# Patient Record
Sex: Female | Born: 1935
Health system: Southern US, Community
[De-identification: ages and names within clinical notes are randomized; demographics above are authoritative.]

## PROBLEM LIST (undated history)

## (undated) DIAGNOSIS — I499 Cardiac arrhythmia, unspecified: Secondary | ICD-10-CM

## (undated) DIAGNOSIS — K449 Diaphragmatic hernia without obstruction or gangrene: Secondary | ICD-10-CM

## (undated) DIAGNOSIS — IMO0002 Reserved for concepts with insufficient information to code with codable children: Secondary | ICD-10-CM

## (undated) DIAGNOSIS — I10 Essential (primary) hypertension: Secondary | ICD-10-CM

## (undated) DIAGNOSIS — M8580 Other specified disorders of bone density and structure, unspecified site: Secondary | ICD-10-CM

## (undated) DIAGNOSIS — K5790 Diverticulosis of intestine, part unspecified, without perforation or abscess without bleeding: Secondary | ICD-10-CM

## (undated) DIAGNOSIS — E785 Hyperlipidemia, unspecified: Secondary | ICD-10-CM

## (undated) DIAGNOSIS — C801 Malignant (primary) neoplasm, unspecified: Secondary | ICD-10-CM

## (undated) HISTORY — DX: Essential (primary) hypertension: I10

## (undated) HISTORY — PX: APPENDECTOMY: SHX54

## (undated) HISTORY — DX: Reserved for concepts with insufficient information to code with codable children: IMO0002

## (undated) HISTORY — DX: Other specified disorders of bone density and structure, unspecified site: M85.80

## (undated) HISTORY — DX: Cardiac arrhythmia, unspecified: I49.9

## (undated) HISTORY — DX: Malignant (primary) neoplasm, unspecified: C80.1

## (undated) HISTORY — DX: Diaphragmatic hernia without obstruction or gangrene: K44.9

## (undated) HISTORY — DX: Hyperlipidemia, unspecified: E78.5

## (undated) HISTORY — PX: BREAST SURGERY: SHX581

## (undated) HISTORY — PX: CATARACT EXTRACTION: SUR2

## (undated) HISTORY — DX: Diverticulosis of intestine, part unspecified, without perforation or abscess without bleeding: K57.90

---

## 1999-03-13 ENCOUNTER — Other Ambulatory Visit: Admission: RE | Admit: 1999-03-13 | Discharge: 1999-03-13 | Payer: Self-pay | Admitting: Obstetrics and Gynecology

## 1999-03-20 ENCOUNTER — Ambulatory Visit (HOSPITAL_COMMUNITY): Admission: RE | Admit: 1999-03-20 | Discharge: 1999-03-20 | Payer: Self-pay | Admitting: Obstetrics and Gynecology

## 1999-03-20 ENCOUNTER — Encounter: Payer: Self-pay | Admitting: Obstetrics and Gynecology

## 2000-05-11 ENCOUNTER — Encounter: Payer: Self-pay | Admitting: Cardiology

## 2000-05-11 ENCOUNTER — Ambulatory Visit (HOSPITAL_COMMUNITY): Admission: RE | Admit: 2000-05-11 | Discharge: 2000-05-11 | Payer: Self-pay | Admitting: Cardiology

## 2001-05-31 ENCOUNTER — Ambulatory Visit (HOSPITAL_COMMUNITY): Admission: RE | Admit: 2001-05-31 | Discharge: 2001-05-31 | Payer: Self-pay | Admitting: Internal Medicine

## 2001-05-31 ENCOUNTER — Encounter: Payer: Self-pay | Admitting: Internal Medicine

## 2002-05-23 ENCOUNTER — Other Ambulatory Visit: Admission: RE | Admit: 2002-05-23 | Discharge: 2002-05-23 | Payer: Self-pay | Admitting: Dermatology

## 2002-06-14 ENCOUNTER — Ambulatory Visit (HOSPITAL_COMMUNITY): Admission: RE | Admit: 2002-06-14 | Discharge: 2002-06-14 | Payer: Self-pay | Admitting: Internal Medicine

## 2002-06-14 ENCOUNTER — Encounter: Payer: Self-pay | Admitting: Internal Medicine

## 2003-06-20 ENCOUNTER — Encounter: Payer: Self-pay | Admitting: Internal Medicine

## 2003-06-20 ENCOUNTER — Ambulatory Visit (HOSPITAL_COMMUNITY): Admission: RE | Admit: 2003-06-20 | Discharge: 2003-06-20 | Payer: Self-pay | Admitting: Internal Medicine

## 2003-08-31 ENCOUNTER — Ambulatory Visit (HOSPITAL_COMMUNITY): Admission: RE | Admit: 2003-08-31 | Discharge: 2003-08-31 | Payer: Self-pay | Admitting: Internal Medicine

## 2004-06-25 ENCOUNTER — Ambulatory Visit (HOSPITAL_COMMUNITY): Admission: RE | Admit: 2004-06-25 | Discharge: 2004-06-25 | Payer: Self-pay | Admitting: Internal Medicine

## 2005-07-09 ENCOUNTER — Ambulatory Visit (HOSPITAL_COMMUNITY): Admission: RE | Admit: 2005-07-09 | Discharge: 2005-07-09 | Payer: Self-pay | Admitting: Internal Medicine

## 2006-07-15 ENCOUNTER — Ambulatory Visit (HOSPITAL_COMMUNITY): Admission: RE | Admit: 2006-07-15 | Discharge: 2006-07-15 | Payer: Self-pay | Admitting: Internal Medicine

## 2006-07-21 ENCOUNTER — Encounter: Admission: RE | Admit: 2006-07-21 | Discharge: 2006-07-21 | Payer: Self-pay | Admitting: Internal Medicine

## 2007-07-28 ENCOUNTER — Encounter: Admission: RE | Admit: 2007-07-28 | Discharge: 2007-07-28 | Payer: Self-pay | Admitting: Internal Medicine

## 2008-08-10 ENCOUNTER — Encounter: Admission: RE | Admit: 2008-08-10 | Discharge: 2008-08-10 | Payer: Self-pay | Admitting: Internal Medicine

## 2008-08-25 ENCOUNTER — Ambulatory Visit: Payer: Self-pay | Admitting: Cardiology

## 2008-08-31 ENCOUNTER — Encounter: Payer: Self-pay | Admitting: Cardiology

## 2008-08-31 ENCOUNTER — Ambulatory Visit (HOSPITAL_COMMUNITY): Admission: RE | Admit: 2008-08-31 | Discharge: 2008-08-31 | Payer: Self-pay | Admitting: Cardiology

## 2008-08-31 ENCOUNTER — Ambulatory Visit: Payer: Self-pay | Admitting: Cardiology

## 2009-08-13 ENCOUNTER — Encounter: Admission: RE | Admit: 2009-08-13 | Discharge: 2009-08-13 | Payer: Self-pay | Admitting: Internal Medicine

## 2010-09-04 ENCOUNTER — Encounter: Admission: RE | Admit: 2010-09-04 | Discharge: 2010-09-04 | Payer: Self-pay | Admitting: Internal Medicine

## 2010-09-10 ENCOUNTER — Encounter: Payer: Self-pay | Admitting: Cardiology

## 2010-09-12 ENCOUNTER — Ambulatory Visit: Payer: Self-pay | Admitting: Cardiology

## 2010-09-12 DIAGNOSIS — I10 Essential (primary) hypertension: Secondary | ICD-10-CM | POA: Insufficient documentation

## 2010-09-12 DIAGNOSIS — R9431 Abnormal electrocardiogram [ECG] [EKG]: Secondary | ICD-10-CM

## 2010-09-12 DIAGNOSIS — R0602 Shortness of breath: Secondary | ICD-10-CM | POA: Insufficient documentation

## 2010-09-23 ENCOUNTER — Telehealth (INDEPENDENT_AMBULATORY_CARE_PROVIDER_SITE_OTHER): Payer: Self-pay | Admitting: *Deleted

## 2010-09-24 ENCOUNTER — Encounter (HOSPITAL_COMMUNITY)
Admission: RE | Admit: 2010-09-24 | Discharge: 2010-11-26 | Payer: Self-pay | Source: Home / Self Care | Attending: Cardiology | Admitting: Cardiology

## 2010-09-24 ENCOUNTER — Encounter: Payer: Self-pay | Admitting: *Deleted

## 2010-09-24 ENCOUNTER — Ambulatory Visit: Payer: Self-pay

## 2010-09-24 ENCOUNTER — Encounter: Payer: Self-pay | Admitting: Internal Medicine

## 2010-09-24 ENCOUNTER — Ambulatory Visit: Payer: Self-pay | Admitting: Cardiology

## 2010-11-26 NOTE — Assessment & Plan Note (Signed)
Summary: ec6/LBBB last seen in 09 by rothbart   Visit Type:  Initial Consult Primary Provider:  Dr. Felipa Eth  CC:  Abnormal EKG.  History of Present Illness: The patient is lovely 77 year old who has had an abnormal EKG in the past with left anterior fascicular block and conduction delays. In 2009 she did have an echocardiogram which demonstrated well-preserved ejection fraction and no significant abnormalities. She now has an EKG with left bundle branch block.  She does have some dyspnea on exertion. She will become short of breath walking a moderate distance on level ground. She gets short of breath walking a flight of stairs. This has been slowly left. She denies any chest pressure, left arm discomfort. She has not had any palpitations, presyncope or syncope. She does not report PND or orthopnea. She has had some mild lower extremity swelling. She does do some activity at the [facility] and participates in "[employer]ne[employer]".  Current Medications (verified): 1)  Simvastatin 20 Mg Tabs (Simvastatin) .Marland Kitchen.. 1 By Mouth Daily 2)  Calcium 600 1500 Mg Tabs (Calcium Carbonate) .... 2  By Mouth Daily 3)  Fish Oil   Oil (Fish Oil) .Marland Kitchen.. 1 By Mouth Daily 4)  Aspirin 81 Mg  Tabs (Aspirin) .Marland Kitchen.. 1 By Mouth Daily  Allergies (verified): No Known Drug Allergies  Past History:  Past Medical History: Hiatal hernia Hyperlipidemia Hypertension Degenerative disc disease Diverticular disease with colonic polyps Osteopenia  Past Surgical History: Appendectomy  Review of Systems       As stated in the HPI and negative for all other systems.   Vital Signs:  Patient profile:   77 year old female Height:      62 inches Weight:      148 pounds BMI:     27.17 Pulse rate:   70 / minute Resp:     16 per minute BP sitting:   142 / 82  (right arm)  Vitals Entered By: Marrion Coy, CNA (September 12, 2010 12:02 PM)  Physical Exam  General:  Well developed, well nourished, in no acute distress. Head:   normocephalic and atraumatic Eyes:  PERRLA/EOM intact; conjunctiva and lids normal. Neck:  Neck supple, no JVD. No masses, thyromegaly or abnormal cervical nodes. Chest Wall:  no deformities or breast masses noted Lungs:  Clear bilaterally to auscultation and percussion. Abdomen:  Bowel sounds positive; abdomen soft and non-tender without masses, organomegaly, or hernias noted. No hepatosplenomegaly. Msk:  Back normal, normal gait. Muscle strength and tone normal. Extremities:  No clubbing or cyanosis. Neurologic:  Alert and oriented x 3. Skin:  Intact without lesions or rashes. Cervical Nodes:  no significant adenopathy Axillary Nodes:  no significant adenopathy Inguinal Nodes:  no significant adenopathy Psych:  Normal affect.   Detailed Cardiovascular Exam  Neck    Carotids: Carotids full and equal bilaterally without bruits.      Neck Veins: Normal, no JVD.    Heart    Inspection: no deformities or lifts noted.      Palpation: normal PMI with no thrills palpable.      Auscultation: regular rate and rhythm, S1, S2 without murmurs, rubs, gallops, or clicks.    Vascular    Abdominal Aorta: no palpable masses, pulsations, or audible bruits.      Femoral Pulses: normal femoral pulses bilaterally.      Pedal Pulses: normal pedal pulses bilaterally.      Radial Pulses: normal radial pulses bilaterally.      Peripheral Circulation: no clubbing, cyanosis,  or edema noted with normal capillary refill.     EKG  Procedure date:  09/12/2010  Findings:      Sinus rhythm, rate 61, left bundle branch block  Impression & Recommendations:  Problem # 1:  ELECTROCARDIOGRAM, ABNORMAL (ICD-794.31) I suspect this is a progression of her previous conduction defect. I will be evaluating this in the context of investigating her dyspnea as described below. Orders: Nuclear Stress Test (Nuc Stress Test) EKG w/ Interpretation (93000)  Problem # 2:  DYSPNEA (ICD-786.05) The patient has had  progressive dyspnea with activity. She has cardiovascular risk factors including a family history with one of her brothers having heart disease starting in his 66s. She needs stress testing but would need perfusion imaging because of her bundle branch block. I will arrange this. Orders: Nuclear Stress Test (Nuc Stress Test) EKG w/ Interpretation (93000)  Problem # 3:  ESSENTIAL HYPERTENSION, BENIGN (ICD-401.1) The patient's blood pressure is upper limits of acceptable today. I will not make any changes to her regimen today.  Patient Instructions: 1)  Your physician recommends that you schedule a follow-up appointment after testing as needed 2)  Your physician recommends that you continue on your current medications as directed. Please refer to the Current Medication list given to you today. 3)  Your physician has requested that you have an adenosine myoview.  For further information please visit [email or web address]  Please follow instruction sheet, as given.

## 2010-11-26 NOTE — Assessment & Plan Note (Signed)
Summary: Cardiology Nuclear Testing  Nuclear Med Background Indications for Stress Test: Evaluation for Ischemia, Abnormal EKG   History: Echo  History Comments: '09 Echo:NL.  Symptoms: DOE    Nuclear Pre-Procedure Cardiac Risk Factors: Hypertension, LBBB, Lipids Caffeine/Decaff Intake: none NPO After: 7:00 PM Lungs: Clear IV 0.9% NS with Angio Cath: 22g     IV Site: R Hand IV Started by: Cathlyn Parsons, RN Chest Size (in) 38     Cup Size C     Height (in): 62 Weight (lb): 152 BMI: 27.90  Nuclear Med Study 1 or 2 day study:  1 day     Stress Test Type:  Adenosine Reading MD:  Cassell Clement, MD     Referring MD:  Shela Commons. Hochrein Resting Radionuclide:  Technetium 12m Tetrofosmin     Resting Radionuclide Dose:  11.0 mCi  Stress Radionuclide:  Technetium 42m Tetrofosmin     Stress Radionuclide Dose:  33.0 mCi   Stress Protocol  Dose of Adenosine:  38.7 mg    Stress Test Technologist:  Milana Na, EMT-P     Nuclear Technologist:  Doyne Keel, CNMT  Rest Procedure  Myocardial perfusion imaging was performed at rest 45 minutes following the intravenous administration of Technetium 31m Tetrofosmin.  Stress Procedure  The patient received IV adenosine at 140 mcg/kg/min for 4 minutes. The EKG was nondiagnostic due to baseline LBBB. The patient complained of chest fullness. Technetium 57m Tetrofosmin was injected at the 2 minute mark and quantitative spect images were obtained after a 45 minute delay.  QPS Raw Data Images:  Normal; no motion artifact; normal heart/lung ratio. Stress Images:  Normal homogeneous uptake in all areas of the myocardium. Rest Images:  Normal homogeneous uptake in all areas of the myocardium. Subtraction (SDS):  No evidence of ischemia. Transient Ischemic Dilatation:  1.08  (Normal <1.22)  Lung/Heart Ratio:  0.28  (Normal <0.45)  Quantitative Gated Spect Images QGS EDV:  71 ml QGS ESV:  21 ml QGS EF:  70 % QGS cine images:  Normal LV  systolic function.  Findings Normal nuclear study      Overall Impression  Exercise Capacity: Lexiscan with no exercise. BP Response: Normal blood pressure response. Clinical Symptoms: Chest fullness ECG Impression: LBBB Overall Impression: Normal stress nuclear study. Overall Impression Comments: Normal lexiscan study.  No ischemia. Good LV systolic function.  LBBB.  Appended Document: Cardiology Nuclear Testing No evidence of ischemia or infarct.  Normal EF.  No further work up.  Appended Document: Cardiology Nuclear Testing pt aware of results

## 2010-11-26 NOTE — Progress Notes (Signed)
Summary: Nuclear pre procedure  Phone Note Outgoing Call Call back at [facility] Phone [phone]   Call placed by: Rea College, CMA,  September 23, 2010 4:58 PM Call placed to: Patient Summary of Call: Reviewed information on Myoview Information Sheet (see scanned document for further details).  Spoke with patient.       Nuclear Med Background Indications for Stress Test: Evaluation for Ischemia, Abnormal EKG   History: Echo   Symptoms: DOE    Nuclear Pre-Procedure Cardiac Risk Factors: Hypertension, LBBB, Lipids Height (in): 62

## 2010-11-28 NOTE — Letter (Signed)
Summary: [facility] Annual Physical Exam   [facility] Annual Physical Exam   Imported By: Roderic Ovens 10/10/2010 15:48:54  _____________________________________________________________________  External Attachment:    Type:   Image     Comment:   External Document

## 2011-03-11 NOTE — Letter (Signed)
August 25, 2008    Larina Earthly, M.D.  [address]  [place], [place] [address]   RE:  DERRICKA, MERTZ  MRN:  [id number]  /  DOB:  11-Jan-1930   Dear Dr. Felipa Eth:   It was my pleasure evaluating Ms. Emley in consultation in the office  today at your request for an abnormal EKG.  As you know, this nice woman  has enjoyed generally good health.  She has been treated for osteopenia  and hyperlipidemia, but has no known cardiovascular disease.  She has  never been evaluated by a cardiologist nor undergone any significant  testing.  A recent EKG shows the development of a new left anterior  fascicular block relative to a prior tracing.  There is also a delayed R-  wave progression representing a possible anteroseptal myocardial  infarction and voltage criteria for LVH.   Ms. Finazzo denies all cardiopulmonary symptoms.  She does her own  housework without difficulty and remains generally active, although she  does not engage in strenuous exercise.  She has had a number of falls in  recent years, one of which resulted in head trauma and evaluation at her  local hospital in 2003.  She does not think that any of these involved  loss of consciousness.  Her most recent fall was approximately 6 months  ago.   ALLERGIES:  She has no known allergies.   CURRENT MEDICATIONS:  1. Fish oil 1000 mg daily.  2. Alendronate 70 mg every week.  3. Calcium with vitamin D, 2 daily.  4. Simvastatin 20 mg daily.  5. Vitamin B12 one daily.   PAST MEDICAL HISTORY:  Otherwise unremarkable.  Her only surgical  procedure was an appendectomy as a child.   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   FAMILY HISTORY:  Father died at age 26 with cardiac problems.  Mother  died at age 33 due to a CVA.  Of 3 siblings, 2 brothers have died with  myocardial infarction.  Her sister is diabetic and also has cardiac  problems.   REVIEW OF SYSTEMS:  Notable for the need for  corrective lenses for near  vision, bilateral cataracts, a regular diet, and arthritic discomfort in  the right shoulder.  All other systems reviewed and are negative.   PHYSICAL EXAMINATION:  GENERAL:  On exam, pleasant older woman in no  acute distress.  VITAL SIGNS:  The weight is 158 pounds.  Blood pressure 110/80, heart  rate 74 and regular, and respirations 14.  HEENT:  EOMs full; normal lids and conjunctivae; normal oral mucosa.  NECK:  No jugular venous distention; normal carotid upstrokes without  bruits.  ENDOCRINE:  No thyromegaly.  HEMATOPOIETIC:  No adenopathy.  LUNGS:  Clear.  CARDIAC:  Normal first and second heart sounds; fourth heart sound  present; modest early systolic ejection murmur.  ABDOMEN:  Soft and nontender; no bruits; normal bowel sounds; no masses  nor organomegaly.  EXTREMITIES:  No edema; normal distal pulses.  NEUROLOGIC:  Symmetric strength and tone; normal cranial nerves.   EKG:  Normal sinus rhythm; left anterior fascicular block; delayed R-  wave progression - cannot exclude prior anteroseptal myocardial  infarction.  Voltage criteria for LVH.   IMPRESSION:  Ms. Drohan has, what appears to be, minor conduction  system disease.  Due to her delayed R-wave progression and evidence for  LVH, an echocardiogram will be performed.  If results are good, no  further Cardiology attention will be required.   Thanks so much for requesting my opinion in the management of this very  nice woman.    Sincerely,     Gerrit Friends. Dietrich Pates, MD, [facility]  Electronically Signed   RMR/MedQ  DD: 08/25/2008  DT: 08/26/2008  Job #: [id number]

## 2011-03-14 NOTE — Op Note (Signed)
NAME:  Sarah Moyer, Sarah Moyer                        ACCOUNT NO.:  [id number]   MEDICAL RECORD NO.:  [id number]                   PATIENT TYPE:  AMB   LOCATION:  DAY                                  FACILITY:  [facility]   PHYSICIAN:  Lionel December, M.D.                 DATE OF BIRTH:  Apr 27, 1930   DATE OF PROCEDURE:  08/31/2003  DATE OF DISCHARGE:                                 OPERATIVE REPORT   PROCEDURE:  Total colonoscopy.   INDICATIONS FOR PROCEDURE:  Sarah Moyer is a 77 year old Caucasian female who is  undergoing screening colonoscopy.  Family history is negative for colorectal  carcinoma.  The procedure and risks were reviewed with the patient, and  informed consent was obtained.   PREOPERATIVE MEDICATIONS:  Demerol 25 mg IV, Versed 5 mg IV in divided  doses.   FINDINGS:  The procedure was performed in the endoscopy suite.  The  patient's vital signs and O2 saturations were monitored during the procedure  and remained stable.  The patient was placed in the left lateral recumbent  position and rectal examination performed.  No abnormality noted on external  or digital exam.  The Olympus videoscope was placed into the rectum and  advanced into the region of the sigmoid colon where she had multiple  diverticula.  The preparation was felt to be excellent.  A few more  diverticula were noted at the descending colon.  The scope was passed to the  cecum which was identified by the appendiceal stump and ileocecal valve.  Pictures were taken for the record.  As the scope was withdrawn, the colonic  mucosa was carefully examined.  There was a small polyp at the sigmoid colon  which was seen on the way in but with difficulty on the way out.  This polyp  was ablated by cold biopsy.  The rectal mucosa was normal.  The scope was  retroflexed to examine the anorectal junction which was unremarkable.  The  endoscope was straightened and withdrawn.  The patient tolerated the  procedure well.   FINAL  DIAGNOSES:  1. Examination performed to the cecum.  2. Left colonic diverticulosis.  3. Small polyp at sigmoid colon that was ablated by cold biopsy.    RECOMMENDATIONS:  1. High fiber diet.  2. Citrucel or equivalent, one tablespoon full daily.  3. I will be contacting the patient with the biopsy results and further     recommendations.      ___________________________________________                                            Lionel December, M.D.   NR/MEDQ  D:  08/31/2003  T:  08/31/2003  Job:  [id number]   cc:   Larina Earthly, M.D.  [address]  [place]  [place] [address]  Fax: [phone]

## 2011-08-05 ENCOUNTER — Other Ambulatory Visit: Payer: Self-pay | Admitting: Internal Medicine

## 2011-08-05 DIAGNOSIS — Z1231 Encounter for screening mammogram for malignant neoplasm of breast: Secondary | ICD-10-CM

## 2011-09-10 ENCOUNTER — Ambulatory Visit
Admission: RE | Admit: 2011-09-10 | Discharge: 2011-09-10 | Disposition: A | Discharge status: Home / Self Care | Payer: Medicare Other | Source: Ambulatory Visit | Attending: Internal Medicine | Admitting: Internal Medicine

## 2011-09-10 DIAGNOSIS — Z1231 Encounter for screening mammogram for malignant neoplasm of breast: Secondary | ICD-10-CM

## 2011-10-08 ENCOUNTER — Encounter: Payer: Self-pay | Admitting: Cardiology

## 2012-06-20 ENCOUNTER — Encounter (HOSPITAL_COMMUNITY): Payer: Self-pay

## 2012-06-20 ENCOUNTER — Inpatient Hospital Stay (HOSPITAL_COMMUNITY)
Admission: EM | Admit: 2012-06-20 | Discharge: 2012-06-23 | DRG: 470 | Disposition: A | Discharge status: Skilled Nursing Facility | Payer: Medicare Other | Attending: Internal Medicine | Admitting: Internal Medicine

## 2012-06-20 ENCOUNTER — Emergency Department (HOSPITAL_COMMUNITY): Payer: Medicare Other

## 2012-06-20 ENCOUNTER — Other Ambulatory Visit: Payer: Self-pay

## 2012-06-20 DIAGNOSIS — M899 Disorder of bone, unspecified: Secondary | ICD-10-CM | POA: Diagnosis present

## 2012-06-20 DIAGNOSIS — D72829 Elevated white blood cell count, unspecified: Secondary | ICD-10-CM

## 2012-06-20 DIAGNOSIS — S72009A Fracture of unspecified part of neck of unspecified femur, initial encounter for closed fracture: Secondary | ICD-10-CM

## 2012-06-20 DIAGNOSIS — M8580 Other specified disorders of bone density and structure, unspecified site: Secondary | ICD-10-CM

## 2012-06-20 DIAGNOSIS — Y9229 Other specified public building as the place of occurrence of the external cause: Secondary | ICD-10-CM

## 2012-06-20 DIAGNOSIS — E785 Hyperlipidemia, unspecified: Secondary | ICD-10-CM

## 2012-06-20 DIAGNOSIS — S72033A Displaced midcervical fracture of unspecified femur, initial encounter for closed fracture: Principal | ICD-10-CM | POA: Diagnosis present

## 2012-06-20 DIAGNOSIS — S72002A Fracture of unspecified part of neck of left femur, initial encounter for closed fracture: Secondary | ICD-10-CM

## 2012-06-20 DIAGNOSIS — K449 Diaphragmatic hernia without obstruction or gangrene: Secondary | ICD-10-CM | POA: Diagnosis present

## 2012-06-20 DIAGNOSIS — I10 Essential (primary) hypertension: Secondary | ICD-10-CM | POA: Diagnosis present

## 2012-06-20 DIAGNOSIS — W19XXXA Unspecified fall, initial encounter: Secondary | ICD-10-CM

## 2012-06-20 DIAGNOSIS — W108XXA Fall (on) (from) other stairs and steps, initial encounter: Secondary | ICD-10-CM | POA: Diagnosis present

## 2012-06-20 LAB — CBC
HCT: 42.4 % (ref 36.0–46.0)
MCH: 31 pg (ref 26.0–34.0)
MCV: 87.6 fL (ref 78.0–100.0)
Platelets: 236 10*3/uL (ref 150–400)
RBC: 4.84 MIL/uL (ref 3.87–5.11)
RDW: 12.3 % (ref 11.5–15.5)
WBC: 15.5 10*3/uL — ABNORMAL HIGH (ref 4.0–10.5)

## 2012-06-20 LAB — COMPREHENSIVE METABOLIC PANEL
AST: 18 U/L (ref 0–37)
Albumin: 3.7 g/dL (ref 3.5–5.2)
Alkaline Phosphatase: 66 U/L (ref 39–117)
Calcium: 9.4 mg/dL (ref 8.4–10.5)
Chloride: 104 mEq/L (ref 96–112)
Creatinine, Ser: 0.95 mg/dL (ref 0.50–1.10)
GFR calc Af Amer: 64 mL/min — ABNORMAL LOW (ref >90)
GFR calc non Af Amer: 55 mL/min — ABNORMAL LOW (ref >90)
Glucose, Bld: 120 mg/dL — ABNORMAL HIGH (ref 70–99)
Potassium: 3.6 mEq/L (ref 3.5–5.1)
Sodium: 140 mEq/L (ref 135–145)
Total Bilirubin: 0.7 mg/dL (ref 0.3–1.2)

## 2012-06-20 LAB — URINALYSIS, ROUTINE W REFLEX MICROSCOPIC
Bilirubin Urine: NEGATIVE
Nitrite: NEGATIVE
Urobilinogen, UA: 1 mg/dL (ref 0.0–1.0)

## 2012-06-20 LAB — PROTIME-INR
INR: 1.1 (ref 0.00–1.49)
Prothrombin Time: 14.4 seconds (ref 11.6–15.2)

## 2012-06-20 LAB — TYPE AND SCREEN: ABO/RH(D): O POS

## 2012-06-20 LAB — URINE MICROSCOPIC-ADD ON

## 2012-06-20 MED ORDER — SODIUM CHLORIDE 0.9 % IV SOLN
INTRAVENOUS | Status: DC
  Administered 2012-06-20 – 2012-06-21 (×2): via INTRAVENOUS

## 2012-06-20 MED ORDER — MORPHINE SULFATE 2 MG/ML IJ SOLN
2.0000 mg | INTRAMUSCULAR | Status: DC | PRN
  Administered 2012-06-20 – 2012-06-21 (×3): 2 mg via INTRAVENOUS
  Filled 2012-06-20 (×3): qty 1

## 2012-06-20 MED ORDER — CALCIUM CARBONATE-VITAMIN D 500-200 MG-UNIT PO TABS
2.0000 | ORAL_TABLET | Freq: Every day | ORAL | Status: DC
  Administered 2012-06-20 – 2012-06-23 (×3): 2 via ORAL
  Filled 2012-06-20 (×5): qty 2

## 2012-06-20 MED ORDER — SODIUM CHLORIDE 0.9 % IV BOLUS (SEPSIS)
500.0000 mL | Freq: Once | INTRAVENOUS | Status: AC
  Administered 2012-06-20: 500 mL via INTRAVENOUS

## 2012-06-20 MED ORDER — DOCUSATE SODIUM 100 MG PO CAPS
100.0000 mg | ORAL_CAPSULE | Freq: Two times a day (BID) | ORAL | Status: DC
  Administered 2012-06-20 – 2012-06-23 (×5): 100 mg via ORAL

## 2012-06-20 MED ORDER — CEFAZOLIN SODIUM-DEXTROSE 2-3 GM-% IV SOLR
2.0000 g | INTRAVENOUS | Status: AC
  Administered 2012-06-21: 2 g via INTRAVENOUS
  Filled 2012-06-20: qty 50

## 2012-06-20 MED ORDER — ASPIRIN EC 81 MG PO TBEC
81.0000 mg | DELAYED_RELEASE_TABLET | Freq: Every day | ORAL | Status: DC
  Filled 2012-06-20: qty 1

## 2012-06-20 MED ORDER — FENTANYL CITRATE 0.05 MG/ML IJ SOLN
INTRAMUSCULAR | Status: AC
  Administered 2012-06-20: 50 ug
  Filled 2012-06-20: qty 2

## 2012-06-20 MED ORDER — HYDROCODONE-ACETAMINOPHEN 5-325 MG PO TABS
1.0000 | ORAL_TABLET | Freq: Four times a day (QID) | ORAL | Status: DC | PRN
  Administered 2012-06-20: 1 via ORAL
  Administered 2012-06-21 – 2012-06-22 (×3): 2 via ORAL
  Filled 2012-06-20: qty 1
  Filled 2012-06-20 (×4): qty 2

## 2012-06-20 MED ORDER — SODIUM CHLORIDE 0.9 % IV SOLN
INTRAVENOUS | Status: DC
  Administered 2012-06-20: 20 mL/h via INTRAVENOUS

## 2012-06-20 MED ORDER — CALCIUM 1200-1000 MG-UNIT PO CHEW: 1200.0000 mg | CHEWABLE_TABLET | Freq: Every day | ORAL | Status: DC

## 2012-06-20 MED ORDER — OMEGA-3 FATTY ACIDS 1000 MG PO CAPS: 1.0000 | ORAL_CAPSULE | Freq: Every day | ORAL | Status: DC

## 2012-06-20 MED ORDER — SIMVASTATIN 20 MG PO TABS
20.0000 mg | ORAL_TABLET | Freq: Every day | ORAL | Status: DC
  Administered 2012-06-22: 20 mg via ORAL
  Filled 2012-06-20 (×3): qty 1

## 2012-06-20 MED ORDER — MORPHINE SULFATE 4 MG/ML IJ SOLN
4.0000 mg | Freq: Once | INTRAMUSCULAR | Status: AC
  Administered 2012-06-20: 4 mg via INTRAVENOUS
  Filled 2012-06-20: qty 1

## 2012-06-20 MED ORDER — ONDANSETRON HCL 4 MG/2ML IJ SOLN
4.0000 mg | Freq: Once | INTRAMUSCULAR | Status: AC
  Administered 2012-06-20: 4 mg via INTRAVENOUS
  Filled 2012-06-20: qty 2

## 2012-06-20 MED ORDER — ENOXAPARIN SODIUM 40 MG/0.4ML ~~LOC~~ SOLN
40.0000 mg | SUBCUTANEOUS | Status: DC
  Filled 2012-06-20: qty 0.4

## 2012-06-20 MED ORDER — OMEGA-3-ACID ETHYL ESTERS 1 G PO CAPS
1.0000 g | ORAL_CAPSULE | Freq: Two times a day (BID) | ORAL | Status: DC
  Administered 2012-06-20 – 2012-06-23 (×5): 1 g via ORAL
  Filled 2012-06-20 (×8): qty 1

## 2012-06-20 NOTE — ED Notes (Signed)
Pt reminded that she needed to give a urine sample. Pt stated that she did not have to go.

## 2012-06-20 NOTE — ED Provider Notes (Addendum)
History     CSN: [id number]  Arrival date & time 06/20/12  1116   First MD Initiated Contact with Patient 06/20/12 1118      Chief Complaint  Patient presents with  . Fall    (Consider location/radiation/quality/duration/timing/severity/associated sxs/prior treatment) Patient is a 78 y.o. female presenting with fall. The history is provided by the patient and the EMS personnel.  Fall Pertinent negatives include no fever, no abdominal pain and no headaches.  pt fell on steps at church. Tripped. Denies faintness or dizziness. Fell onto left hip, c/o constant, dull, non radiating, severe left hip pain, worse w movement of hip/leg. No head injury or headache. No neck or back pain. No numbness/weakness. Skin intact. Denies any other symptoms or pain.     Past Medical History  Diagnosis Date  . Hiatal hernia   . Hyperlipidemia   . Hypertension   . DDD (degenerative disc disease)   . Diverticular disease     With colonic polyps  . Osteopenia     Past Surgical History  Procedure Date  . Appendectomy     No family history on file.  History  Substance Use Topics  . Smoking status: Never Smoker   . Smokeless tobacco: Not on file  . Alcohol Use: No    OB History    Grav Para Term Preterm Abortions TAB SAB Ect Mult Living                  Review of Systems  Constitutional: Negative for fever and chills.  HENT: Negative for neck pain.   Eyes: Negative for redness.  Respiratory: Negative for shortness of breath.   Cardiovascular: Negative for chest pain and leg swelling.  Gastrointestinal: Negative for abdominal pain.  Genitourinary: Negative for flank pain.  Musculoskeletal: Negative for back pain.  Skin: Negative for wound.  Neurological: Negative for headaches.  Hematological: Does not bruise/bleed easily.  Psychiatric/Behavioral: Negative for confusion.    Allergies  Review of patient's allergies indicates no known allergies.  Home Medications   Current  Outpatient Rx  Name Route Sig Dispense Refill  . ASPIRIN EC 81 MG PO TBEC Oral Take 81 mg by mouth daily.    Marland Kitchen CALCIUM PO Oral Take 1 tablet by mouth daily.    . OMEGA-3 FATTY ACIDS 1000 MG PO CAPS Oral Take 1 capsule by mouth daily.      Marland Kitchen SIMVASTATIN 20 MG PO TABS Oral Take 20 mg by mouth daily.       There were no vitals taken for this visit.  Physical Exam  Nursing note and vitals reviewed. Constitutional: She is oriented to person, place, and time. She appears well-developed and well-nourished. No distress.  HENT:  Head: Atraumatic.  Eyes: Conjunctivae are normal. Pupils are equal, round, and reactive to light. No scleral icterus.  Neck: Normal range of motion. Neck supple. No tracheal deviation present.  Cardiovascular: Normal rate, regular rhythm, normal heart sounds and intact distal pulses.   Pulmonary/Chest: Effort normal and breath sounds normal. No respiratory distress. She exhibits no tenderness.  Abdominal: Soft. Normal appearance. She exhibits no distension. There is no tenderness.  Musculoskeletal: She exhibits no edema.       CTLS spine, non tender, aligned, no step off. Tenderness left hip, pain w rom. Distal pulses palp. No other focal bony tenderness noted on bil extremity exam.   Neurological: She is alert and oriented to person, place, and time.       Motor intact bil  Skin: Skin is warm and dry. No rash noted.  Psychiatric: She has a normal mood and affect.    ED Course  Procedures (including critical care time)   Results for orders placed during the hospital encounter of 06/20/12  [facility]      Component Value Range   Prothrombin Time 14.4  11.6 - 15.2 seconds   INR 1.10  0.00 - 1.49  CBC      Component Value Range   WBC 15.5 (*) 4.0 - 10.5 K/uL   RBC 4.84  3.87 - 5.11 MIL/uL   Hemoglobin 15.0  12.0 - 15.0 g/dL   HCT 16.1  09.6 - 04.5 %   MCV 87.6  78.0 - 100.0 fL   MCH 31.0  26.0 - 34.0 pg   MCHC 35.4  30.0 - 36.0 g/dL   RDW 40.9  81.1 -  91.4 %   Platelets 236  150 - 400 K/uL   Dg Chest 1 View  06/20/2012  *RADIOLOGY REPORT*  Clinical Data: Fall and left hip pain.  CHEST - 1 VIEW  Comparison: None.  Findings: Single view of the chest demonstrates a large retrocardiac opacification.  Findings are suggestive for a very large hiatal hernia.  Otherwise, the lungs are clear.  Heart size is within normal limits and the trachea is midline.  Prominent right paratracheal density could be vascular in etiology or could represent thyroid tissue.  IMPRESSION: Large retrocardiac opacification is most consistent with a large hiatal hernia.  No acute chest findings.   Original Report Authenticated By: Richarda Overlie, M.D.    Dg Hip Complete Left  06/20/2012  *RADIOLOGY REPORT*  Clinical Data: Fall, left hip pain  LEFT HIP - COMPLETE 2+ VIEW  Comparison: None.  Findings: Transcervical left femoral neck fracture.  Mild foreshortening/proximal migration.  No additional fracture is seen.  Bilateral hip joint spaces are mildly narrowed but symmetric.  Degenerative changes of the lower lumbar spine.  IMPRESSION: Transcervical left femoral neck fracture, as described above.   Original Report Authenticated By: Charline Bills, M.D.       MDM  Iv ns. Morphine iv. zofran iv. Xrays.   Pt/family requests Dr Anson Crofts group - paged their service. Dr Victorino Dike responded, they will see from ortho standpoint, requests med service admit.  [facility] called to admit.  Recheck pain improved. Distal pulses palp.   Dr Victorino Dike states Dr Charlann Boxer will plan to see and do pts hip surgery tomorrow, but requests that pt be transferred to [facility] for admission/surgery.  Discussed w hospitalist service, they will admit/transfer to [facility].     Date: 06/20/2012  Rate: 94  Rhythm: normal sinus rhythm  QRS Axis: normal  Intervals: PR prolonged  ST/T Wave abnormalities: nonspecific ST/T changes  Conduction Disutrbances:first-degree A-V block  and left bundle branch block  Narrative  Interpretation:   Old EKG Reviewed: none available         Suzi Roots, MD 06/20/12 1321  Suzi Roots, MD 06/20/12 1349  Suzi Roots, MD 06/20/12 1539

## 2012-06-20 NOTE — H&P (Addendum)
[facility] History and Physical  Sarah Moyer YQI:[id number] DOB: [birth year] DOA: 06/20/2012   PCP: Hoyle Sauer, MD   Chief Complaint: Mechanical fall and left hip  HPI:  Pleasant 78 year old female who was at church this morning when she was walking down stairs and missed a step. The patient fell onto her left hip after which she experienced significant pain with movement. There was no syncope. The patient was in her usual state of health prior to this fall. The patient denied any presyncopal type symptoms, palpitations, dizziness, visual changes, headache, chest discomfort, SOB. There has not been any fevers, chills, nausea, vomiting, diarrhea. The patient states that she normally does not have any difficulty with ambulation. There is no history of ataxia or gait disturbance. There is no dysuria, hematuria, abdominal pain .Of note, the patient has a history of hypertension that is diet controlled. In the emergency department, x-rays revealed a left transcervical femoral neck fracture. The family requested Dr. Charlann Boxer to be consulted for orthopedics. At this time hospitalists services admitting the patient from which she will be transferred over to was alone for surgery tomorrow. Remainder of the workup the emergency department included chest x-ray and CMP were negative. Assessment/Plan: Left femoral neck fracture -No neurological cardiovascular type symptoms -Due to mechanical fall -Check UA, urine culture, EKG, morning labs, coagulation studies Hyperlipidemia -Continue home dose of simvastatin 20 mg daily -Continue fish oil Hypertension -Patient states that she is diet controlled -Continue aspirin 81 mg daily Osteopenia -Patient takes vitamin D and calcium supplementation -Check vitamin D levels -At some point, the patient may benefit from a bisphosphonate Right paratracheal density -Will ultimately need outpatient followup Leukocytosis -Likely reactive, no signs or  symptoms of infection or sepsis LBBB -present on EKG on Nov. 2011 -currently asymptomatic      Past Medical History  Diagnosis Date  . Hiatal hernia   . Hyperlipidemia   . Hypertension   . DDD (degenerative disc disease)   . Diverticular disease     With colonic polyps  . Osteopenia    Past Surgical History  Procedure Date  . Appendectomy    Social History:  reports that she has never smoked. She does not have any smokeless tobacco history on file. She reports that she does not drink alcohol or use illicit drugs.  No Known Allergies  No family history on file.  Prior to Admission medications   Medication Sig Start Date End Date Taking? Authorizing Provider  aspirin EC 81 MG tablet Take 81 mg by mouth daily.   Yes Historical Provider, MD  CALCIUM PO Take 1 tablet by mouth daily.   Yes Historical Provider, MD  fish oil-omega-3 fatty acids 1000 MG capsule Take 1 capsule by mouth daily.     Yes Historical Provider, MD  simvastatin (ZOCOR) 20 MG tablet Take 20 mg by mouth daily.    Yes Historical Provider, MD    Review of Systems:  Constitutional:  No weight loss, night sweats, Fevers, chills, fatigue.  Head&Eyes: No headache.  No vision loss.  No eye pain or scotoma ENT:  No Difficulty swallowing,Tooth/dental problems,Sore throat,  No ear ache, nasal congestion, post nasal drip,  Cardio-vascular:  No chest pain, Orthopnea, PND, swelling in lower extremities,  dizziness, palpitations  GI:  No heartburn, indigestion, abdominal pain, nausea, vomiting, diarrhea,  loss of appetite  Resp:  No shortness of breath with exertion or at rest. No excess mucus, no productive cough, No non-productive cough, No coughing up of blood.No  change in color of mucus.No wheezing.No chest wall deformity  Skin:  no rash or lesions.  GU:  no dysuria, change in color of urine, no urgency or frequency. No flank pain.  Musculoskeletal:  No joint pain or swelling. No decreased range of motion.  No back pain.  Psych:  No change in mood or affect. No depression or anxiety. Neurologic: No headache, no dysesthesia, no focal weakness, no vision loss. No syncope  Physical Exam: Filed Vitals:   06/20/12 1230  BP: 140/106  Pulse: 68  SpO2: 100%   General: Alert oriented x3, no apparent distress, well-developed, cooperative HEENT: Normocephalic atraumatic, no icterus, no thrush, no carotid bruits. No neck masses. No meningismus. No conjunctival hemorrhage Cardiovascular: RRR, 1/6 systolic murmur right upper sternal border, no rubs or gallops Lungs: Clear to auscultation bilateral, no wheezes or rhonchi Abdomen: Soft, nontender, there are bowel sounds, nondistended Extremities: Trace edema bilateral lower extremities. Scattered varicosities. No rashes or lymphangitis. Left hip examination limited due to pain Neurologic: Cranial nerves 2-12 grossly intact. Strength 4/5 bilateral upper and lower extremities.  Labs on Admission:  Basic Metabolic Panel:  Lab 06/20/12 4696  NA 140  K 3.6  CL 104  CO2 25  GLUCOSE 120*  BUN 13  CREATININE 0.95  CALCIUM 9.4  MG --  PHOS --   Liver Function Tests:  Lab 06/20/12 1234  AST 18  ALT 14  ALKPHOS 66  BILITOT 0.7  PROT 6.4  ALBUMIN 3.7   No results found for this basename: LIPASE:5,AMYLASE:5 in the last 168 hours No results found for this basename: AMMONIA:5 in the last 168 hours CBC:  Lab 06/20/12 1234  WBC 15.5*  NEUTROABS --  HGB 15.0  HCT 42.4  MCV 87.6  PLT 236   Cardiac Enzymes: No results found for this basename: CKTOTAL:5,CKMB:5,CKMBINDEX:5,TROPONINI:5 in the last 168 hours BNP: No components found with this basename: POCBNP:5 CBG: No results found for this basename: GLUCAP:5 in the last 168 hours  Radiological Exams on Admission: Dg Chest 1 View  06/20/2012  *RADIOLOGY REPORT*  Clinical Data: Fall and left hip pain.  CHEST - 1 VIEW  Comparison: None.  Findings: Single view of the chest demonstrates a large  retrocardiac opacification.  Findings are suggestive for a very large hiatal hernia.  Otherwise, the lungs are clear.  Heart size is within normal limits and the trachea is midline.  Prominent right paratracheal density could be vascular in etiology or could represent thyroid tissue.  IMPRESSION: Large retrocardiac opacification is most consistent with a large hiatal hernia.  No acute chest findings.   Original Report Authenticated By: Richarda Overlie, M.D.    Dg Hip Complete Left  06/20/2012  *RADIOLOGY REPORT*  Clinical Data: Fall, left hip pain  LEFT HIP - COMPLETE 2+ VIEW  Comparison: None.  Findings: Transcervical left femoral neck fracture.  Mild foreshortening/proximal migration.  No additional fracture is seen.  Bilateral hip joint spaces are mildly narrowed but symmetric.  Degenerative changes of the lower lumbar spine.  IMPRESSION: Transcervical left femoral neck fracture, as described above.   Original Report Authenticated By: Charline Bills, M.D.     EKG: will place order for EKG     Time spend:  Code Status: Full Family Communication: family updated at bedside   Demonta Wombles, DO  [facility] Pager [phone]  If 7PM-7AM, please contact night-coverage [email or web address] Password [facility] 06/20/2012, 3:02 PM

## 2012-06-20 NOTE — ED Notes (Signed)
PT. TRANSP[facility]. 

## 2012-06-20 NOTE — ED Notes (Signed)
Pt given a soda with approval from Dr. Denton Lank

## 2012-06-20 NOTE — Consult Note (Signed)
Reason for Consult:  Left hip pain Referring Physician: [name] is an 78 y.o. female.  HPI: 78 y/o woman without significant PMH fell this morning after missing a step.  She c/o aching pain in the left hip that is worse with any motion and less severe with rest.  No h/o previous hip pain, surgery or injury.  She is not diabetic or hypothyroid.  She is not a smoker.  She lives at home with her husband.  Accompanied by her husband, son and daughter in law today.  Brought to the [facility] ED via EMS.  She requests Dr. Charlann Moyer.  She takes no blood thinners other than a baby aspirin daily.  She denies any recent f/c/n/v/wt loss.  Past Medical History  Diagnosis Date  . Hiatal hernia   . Hyperlipidemia   . Hypertension   . DDD (degenerative disc disease)   . Diverticular disease     With colonic polyps  . Osteopenia     Past Surgical History  Procedure Date  . Appendectomy     FH:  None relevant to this admission.  Social History:  reports that she has never smoked. She does not have any smokeless tobacco history on file. She reports that she does not drink alcohol or use illicit drugs.  Allergies: No Known Allergies  Medications: I have reviewed the patient's current medications.  Results for orders placed during the hospital encounter of 06/20/12 (from the past 48 hour(s))  PROTIME-INR     Status: Normal   Collection Time   06/20/12 12:34 PM      Component Value Range Comment   Prothrombin Time 14.4  11.6 - 15.2 seconds    INR 1.10  0.00 - 1.49   COMPREHENSIVE METABOLIC PANEL     Status: Abnormal   Collection Time   06/20/12 12:34 PM      Component Value Range Comment   Sodium 140  135 - 145 mEq/L    Potassium 3.6  3.5 - 5.1 mEq/L    Chloride 104  96 - 112 mEq/L    CO2 25  19 - 32 mEq/L    Glucose, Bld 120 (*) 70 - 99 mg/dL    BUN 13  6 - 23 mg/dL    Creatinine, Ser 4.09  0.50 - 1.10 mg/dL    Calcium 9.4  8.4 - 81.1 mg/dL    Total Protein 6.4  6.0 - 8.3 g/dL    Albumin 3.7  3.5 - 5.2 g/dL    AST 18  0 - 37 U/L    ALT 14  0 - 35 U/L    Alkaline Phosphatase 66  39 - 117 U/L    Total Bilirubin 0.7  0.3 - 1.2 mg/dL    GFR calc non Af Amer 55 (*) >90 mL/min    GFR calc Af Amer 64 (*) >90 mL/min   CBC     Status: Abnormal   Collection Time   06/20/12 12:34 PM      Component Value Range Comment   WBC 15.5 (*) 4.0 - 10.5 K/uL    RBC 4.84  3.87 - 5.11 MIL/uL    Hemoglobin 15.0  12.0 - 15.0 g/dL    HCT 91.4  78.2 - 95.6 %    MCV 87.6  78.0 - 100.0 fL    MCH 31.0  26.0 - 34.0 pg    MCHC 35.4  30.0 - 36.0 g/dL    RDW 21.3  08.6 - 57.8 %  Platelets 236  150 - 400 K/uL   TYPE AND SCREEN     Status: Normal   Collection Time   06/20/12 12:35 PM      Component Value Range Comment   ABO/RH(D) O POS      Antibody Screen NEG      Sample Expiration 06/23/2012     ABO/RH     Status: Normal   Collection Time   06/20/12 12:35 PM      Component Value Range Comment   ABO/RH(D) O POS       Dg Chest 1 View  06/20/2012  *RADIOLOGY REPORT*  Clinical Data: Fall and left hip pain.  CHEST - 1 VIEW  Comparison: None.  Findings: Single view of the chest demonstrates a large retrocardiac opacification.  Findings are suggestive for a very large hiatal hernia.  Otherwise, the lungs are clear.  Heart size is within normal limits and the trachea is midline.  Prominent right paratracheal density could be vascular in etiology or could represent thyroid tissue.  IMPRESSION: Large retrocardiac opacification is most consistent with a large hiatal hernia.  No acute chest findings.   Original Report Authenticated By: Richarda Overlie, M.D.    Dg Hip Complete Left  06/20/2012  *RADIOLOGY REPORT*  Clinical Data: Fall, left hip pain  LEFT HIP - COMPLETE 2+ VIEW  Comparison: None.  Findings: Transcervical left femoral neck fracture.  Mild foreshortening/proximal migration.  No additional fracture is seen.  Bilateral hip joint spaces are mildly narrowed but symmetric.  Degenerative changes of  the lower lumbar spine.  IMPRESSION: Transcervical left femoral neck fracture, as described above.   Original Report Authenticated By: Charline Bills, M.D.     ROS:  As above and o/w neg. PE:  Blood pressure 140/106, pulse 68, SpO2 100.00%. WN WD woman in nad.  A and O x 4.  Mood and affect normal.  EOMI.  Respirations unlabored.  L LE shortened and externally rotated.  Skin of the L LE is healthy and intact.  1+ dp and pt pulses.  Feels LT throughout the L LE.  No lymphadenopathy noted.  5/5 strength in PF and DF at ankle and toes.   Assessment/Plan: Left hip displaced femoral neck fracture - I'll transfer the patient to [facility] for admission to the hospitalist service.  She is on the schedule for the OR Monday afternoon for left hip hemiarthroplasty.  NPO after midnight.  SCDs for DVT prophylaxis.  No lovenox pre-op.  Bed rest.  This is a severe injury with significant risk to the patient's ability to ambulate independently.  This condition requires complex medical decision making.  Sarah Moyer 06/20/2012, 1:58 PM

## 2012-06-20 NOTE — ED Notes (Signed)
Pt fell going [place] steps at church.  Pt fell on left side and c/o L butt pain.

## 2012-06-20 NOTE — ED Notes (Signed)
Report given to [facility]  

## 2012-06-21 ENCOUNTER — Inpatient Hospital Stay (HOSPITAL_COMMUNITY): Payer: Medicare Other | Admitting: Anesthesiology

## 2012-06-21 ENCOUNTER — Encounter (HOSPITAL_COMMUNITY)
Admission: EM | Disposition: A | Discharge status: Skilled Nursing Facility | Payer: Self-pay | Source: Home / Self Care | Attending: Internal Medicine

## 2012-06-21 ENCOUNTER — Encounter (HOSPITAL_COMMUNITY): Payer: Self-pay | Admitting: Anesthesiology

## 2012-06-21 ENCOUNTER — Inpatient Hospital Stay (HOSPITAL_COMMUNITY): Payer: Medicare Other

## 2012-06-21 DIAGNOSIS — E785 Hyperlipidemia, unspecified: Secondary | ICD-10-CM

## 2012-06-21 DIAGNOSIS — W19XXXA Unspecified fall, initial encounter: Secondary | ICD-10-CM

## 2012-06-21 DIAGNOSIS — S72009A Fracture of unspecified part of neck of unspecified femur, initial encounter for closed fracture: Secondary | ICD-10-CM

## 2012-06-21 HISTORY — PX: HIP ARTHROPLASTY: SHX981

## 2012-06-21 LAB — CBC
Hemoglobin: 13.5 g/dL (ref 12.0–15.0)
MCHC: 34.2 g/dL (ref 30.0–36.0)
Platelets: 213 10*3/uL (ref 150–400)
RDW: 12.5 % (ref 11.5–15.5)

## 2012-06-21 LAB — BASIC METABOLIC PANEL
BUN: 10 mg/dL (ref 6–23)
GFR calc Af Amer: 71 mL/min — ABNORMAL LOW (ref >90)
GFR calc non Af Amer: 61 mL/min — ABNORMAL LOW (ref >90)
Potassium: 3.8 mEq/L (ref 3.5–5.1)

## 2012-06-21 LAB — VITAMIN D 25 HYDROXY (VIT D DEFICIENCY, FRACTURES): Vit D, 25-Hydroxy: 36 ng/mL (ref 30–89)

## 2012-06-21 LAB — APTT: aPTT: 31 seconds (ref 24–37)

## 2012-06-21 SURGERY — HEMIARTHROPLASTY, HIP, DIRECT ANTERIOR APPROACH, FOR FRACTURE
Anesthesia: Spinal | Site: Hip | Laterality: Left | Wound class: Clean

## 2012-06-21 MED ORDER — SODIUM CHLORIDE 0.9 % IV SOLN
INTRAVENOUS | Status: DC
  Administered 2012-06-21 – 2012-06-22 (×2): via INTRAVENOUS

## 2012-06-21 MED ORDER — MIDAZOLAM HCL 5 MG/5ML IJ SOLN
INTRAMUSCULAR | Status: DC | PRN
  Administered 2012-06-21: 1 mg via INTRAVENOUS

## 2012-06-21 MED ORDER — HYDROMORPHONE HCL PF 1 MG/ML IJ SOLN: 0.2500 mg | INTRAMUSCULAR | Status: DC | PRN

## 2012-06-21 MED ORDER — LACTATED RINGERS IV SOLN: INTRAVENOUS | Status: DC

## 2012-06-21 MED ORDER — 0.9 % SODIUM CHLORIDE (POUR BTL) OPTIME
TOPICAL | Status: DC | PRN
  Administered 2012-06-21: 1000 mL

## 2012-06-21 MED ORDER — HYDROMORPHONE HCL PF 1 MG/ML IJ SOLN
0.5000 mg | INTRAMUSCULAR | Status: DC | PRN
  Administered 2012-06-21: 0.5 mg via INTRAVENOUS
  Administered 2012-06-21: 1 mg via INTRAVENOUS
  Filled 2012-06-21 (×2): qty 1

## 2012-06-21 MED ORDER — MEPERIDINE HCL 25 MG/ML IJ SOLN
6.2500 mg | INTRAMUSCULAR | Status: DC | PRN
  Filled 2012-06-21: qty 1

## 2012-06-21 MED ORDER — POLYETHYLENE GLYCOL 3350 17 G PO PACK
17.0000 g | PACK | Freq: Two times a day (BID) | ORAL | Status: DC
  Administered 2012-06-21 – 2012-06-23 (×4): 17 g via ORAL

## 2012-06-21 MED ORDER — PROMETHAZINE HCL 25 MG/ML IJ SOLN: 6.2500 mg | INTRAMUSCULAR | Status: DC | PRN

## 2012-06-21 MED ORDER — LACTATED RINGERS IV SOLN
INTRAVENOUS | Status: DC
  Administered 2012-06-21 (×2): via INTRAVENOUS
  Administered 2012-06-21: 1000 mL via INTRAVENOUS

## 2012-06-21 MED ORDER — BUPIVACAINE HCL (PF) 0.5 % IJ SOLN
INTRAMUSCULAR | Status: DC | PRN
  Administered 2012-06-21: 3 mL

## 2012-06-21 MED ORDER — ENOXAPARIN SODIUM 40 MG/0.4ML ~~LOC~~ SOLN
40.0000 mg | SUBCUTANEOUS | Status: DC
  Administered 2012-06-22 – 2012-06-23 (×2): 40 mg via SUBCUTANEOUS
  Filled 2012-06-21 (×3): qty 0.4

## 2012-06-21 MED ORDER — EPHEDRINE SULFATE 50 MG/ML IJ SOLN
INTRAMUSCULAR | Status: DC | PRN
  Administered 2012-06-21: 7.5 mg via INTRAVENOUS
  Administered 2012-06-21: 10 mg via INTRAVENOUS
  Administered 2012-06-21: 5 mg via INTRAVENOUS

## 2012-06-21 MED ORDER — BUPIVACAINE HCL (PF) 0.5 % IJ SOLN
INTRAMUSCULAR | Status: AC
  Filled 2012-06-21: qty 30

## 2012-06-21 MED ORDER — CEFAZOLIN SODIUM-DEXTROSE 2-3 GM-% IV SOLR
2.0000 g | Freq: Three times a day (TID) | INTRAVENOUS | Status: AC
  Administered 2012-06-22 (×3): 2 g via INTRAVENOUS
  Filled 2012-06-21 (×3): qty 50

## 2012-06-21 MED ORDER — PROPOFOL 10 MG/ML IV BOLUS
INTRAVENOUS | Status: DC | PRN
  Administered 2012-06-21: 30 mg via INTRAVENOUS

## 2012-06-21 MED ORDER — ACETAMINOPHEN 10 MG/ML IV SOLN
INTRAVENOUS | Status: DC | PRN
  Administered 2012-06-21: 1000 mg via INTRAVENOUS

## 2012-06-21 SURGICAL SUPPLY — 46 items
BAG ZIPLOCK 12X15 (MISCELLANEOUS) IMPLANT
BLADE SAW SGTL 18X1.27X75 (BLADE) ×2 IMPLANT
CLOTH BEACON ORANGE TIMEOUT ST (SAFETY) ×2 IMPLANT
DERMABOND ADVANCED (GAUZE/BANDAGES/DRESSINGS) ×1
DERMABOND ADVANCED .7 DNX12 (GAUZE/BANDAGES/DRESSINGS) ×1 IMPLANT
DRAPE INCISE IOBAN 85X60 (DRAPES) ×2 IMPLANT
DRAPE ORTHO SPLIT 77X108 STRL (DRAPES) ×2
DRAPE POUCH INSTRU U-SHP 10X18 (DRAPES) ×2 IMPLANT
DRAPE SURG 17X11 SM STRL (DRAPES) ×2 IMPLANT
DRAPE SURG ORHT 6 SPLT 77X108 (DRAPES) ×2 IMPLANT
DRAPE U-SHAPE 47X51 STRL (DRAPES) ×2 IMPLANT
DRSG AQUACEL AG ADV 3.5X10 (GAUZE/BANDAGES/DRESSINGS) ×2 IMPLANT
DRSG TEGADERM 4X4.75 (GAUZE/BANDAGES/DRESSINGS) ×2 IMPLANT
DURAPREP 26ML APPLICATOR (WOUND CARE) ×2 IMPLANT
ELECT BLADE TIP CTD 4 INCH (ELECTRODE) ×2 IMPLANT
ELECT REM PT RETURN 9FT ADLT (ELECTROSURGICAL) ×2
ELECTRODE REM PT RTRN 9FT ADLT (ELECTROSURGICAL) ×1 IMPLANT
EVACUATOR 1/8 PVC DRAIN (DRAIN) ×2 IMPLANT
FACESHIELD LNG OPTICON STERILE (SAFETY) ×6 IMPLANT
GAUZE SPONGE 2X2 8PLY STRL LF (GAUZE/BANDAGES/DRESSINGS) ×1 IMPLANT
GLOVE BIOGEL PI IND STRL 7.5 (GLOVE) ×1 IMPLANT
GLOVE BIOGEL PI IND STRL 8 (GLOVE) ×1 IMPLANT
GLOVE BIOGEL PI INDICATOR 7.5 (GLOVE) ×1
GLOVE BIOGEL PI INDICATOR 8 (GLOVE) ×1
GLOVE ECLIPSE 8.0 STRL XLNG CF (GLOVE) IMPLANT
GLOVE ORTHO TXT STRL SZ7.5 (GLOVE) ×4 IMPLANT
GLOVE SURG SS PI 8.5 STRL IVOR (GLOVE) ×2
GLOVE SURG SS PI 8.5 STRL STRW (GLOVE) ×2 IMPLANT
GOWN BRE IMP PREV XXLGXLNG (GOWN DISPOSABLE) ×4 IMPLANT
GOWN STRL NON-REIN LRG LVL3 (GOWN DISPOSABLE) ×2 IMPLANT
HANDPIECE INTERPULSE COAX TIP (DISPOSABLE)
IMMOBILIZER KNEE 20 (SOFTGOODS)
IMMOBILIZER KNEE 20 THIGH 36 (SOFTGOODS) IMPLANT
KIT BASIN OR (CUSTOM PROCEDURE TRAY) ×2 IMPLANT
MANIFOLD NEPTUNE II (INSTRUMENTS) ×2 IMPLANT
PACK TOTAL JOINT (CUSTOM PROCEDURE TRAY) ×2 IMPLANT
POSITIONER SURGICAL ARM (MISCELLANEOUS) ×2 IMPLANT
SET HNDPC FAN SPRY TIP SCT (DISPOSABLE) IMPLANT
SPONGE GAUZE 2X2 STER 10/PKG (GAUZE/BANDAGES/DRESSINGS) ×1
SUT MNCRL AB 4-0 PS2 18 (SUTURE) ×2 IMPLANT
SUT VIC AB 1 CT1 36 (SUTURE) ×2 IMPLANT
SUT VIC AB 2-0 CT1 27 (SUTURE) ×3
SUT VIC AB 2-0 CT1 TAPERPNT 27 (SUTURE) ×3 IMPLANT
SUT VLOC 180 0 24IN GS25 (SUTURE) ×2 IMPLANT
TOWEL OR 17X26 10 PK STRL BLUE (TOWEL DISPOSABLE) ×4 IMPLANT
WATER STERILE IRR 1500ML POUR (IV SOLUTION) ×2 IMPLANT

## 2012-06-21 NOTE — Anesthesia Postprocedure Evaluation (Signed)
  Anesthesia Post-op Note  Patient: Sarah Moyer  Procedure(s) Performed: Procedure(s) (LRB): ARTHROPLASTY BIPOLAR HIP (Left)  Patient Location: PACU  Anesthesia Type: Spinal  Level of Consciousness: awake and alert   Airway and Oxygen Therapy: Patient Spontanous Breathing  Post-op Pain: mild  Post-op Assessment: Post-op Vital signs reviewed, Patient's Cardiovascular Status Stable, Respiratory Function Stable, Patent Airway and No signs of Nausea or vomiting  Post-op Vital Signs: stable  Complications: No apparent anesthesia complications

## 2012-06-21 NOTE — Progress Notes (Signed)
Utilization review completed.  

## 2012-06-21 NOTE — Anesthesia Preprocedure Evaluation (Addendum)
Anesthesia Evaluation  Patient identified by MRN, date of birth, ID band Patient awake and Patient confused    Reviewed: Allergy & Precautions, H&P , NPO status , Patient's Chart, lab work & pertinent test results  Airway Mallampati: II TM Distance: >3 FB Neck ROM: Full    Dental No notable dental hx.    Pulmonary neg pulmonary ROS,  breath sounds clear to auscultation  Pulmonary exam normal       Cardiovascular hypertension, negative cardio ROS  Rhythm:Regular Rate:Normal     Neuro/Psych negative neurological ROS  negative psych ROS   GI/Hepatic negative GI ROS, Neg liver ROS, hiatal hernia,   Endo/Other  negative endocrine ROS  Renal/GU negative Renal ROS  negative genitourinary   Musculoskeletal negative musculoskeletal ROS (+)   Abdominal   Peds negative pediatric ROS (+)  Hematology negative hematology ROS (+)   Anesthesia Other Findings   Reproductive/Obstetrics negative OB ROS                          Anesthesia Physical Anesthesia Plan  ASA: II  Anesthesia Plan: Spinal   Post-op Pain Management:    Induction:   Airway Management Planned: Simple Face Mask  Additional Equipment:   Intra-op Plan:   Post-operative Plan:   Informed Consent: I have reviewed the patients History and Physical, chart, labs and discussed the procedure including the risks, benefits and alternatives for the proposed anesthesia with the patient or authorized representative who has indicated his/her understanding and acceptance.   Dental advisory given  Plan Discussed with: CRNA and Surgeon  Anesthesia Plan Comments:         Anesthesia Quick Evaluation

## 2012-06-21 NOTE — Progress Notes (Signed)
[facility] PROGRESS NOTE  Sarah Moyer AOZ:[id number] DOB: 31-May-1930 DOA: 06/20/2012 PCP: Sarah Sauer, MD  Assessment/Plan: Left femoral neck fracture  -Due to mechanical fall  -scheduled for OR this pm. Pain control and DVT prophylaxis  per ortho -PT eval in am  Hyperlipidemia  -Continue home dose of simvastatin 20 mg daily  -Continue fish oil   Hypertension  - diet controlled  -Continue aspirin 81 mg daily   Osteopenia  -Patient takes vitamin D and calcium supplementation  -vitamin D levels wnl   Right paratracheal density  -Will  need outpatient followup   Leukocytosis  -Likely reactive, no signs or symptoms of infection or sepsis   LBBB  -present on EKG on Nov. 2011  -currently asymptomatic    Code Status: full Family Communication: at bedside Disposition Plan: pending post op improvement   Brief narrative: Pleasant 78 year old female who was at church this morning when she was walking down stairs and missed a step with transcervical  left femoral neck  fracture.   Consultants:  Sarah Moyer ( ortho)  Procedures:  Pending surgery today  Antibiotics:  none  HPI/Subjective:c/o pain over left leg   Objective: Filed Vitals:   06/21/12 0400 06/21/12 0440 06/21/12 0800 06/21/12 1400  BP:  149/57  150/81  Pulse:  74  63  Temp:  99.2 F (37.3 C)  97.9 F (36.6 C)  TempSrc:  Oral  Oral  Resp: 20 20 16 16   Height:      Weight:      SpO2: 100% 100%  99%    Intake/Output Summary (Last 24 hours) at 06/21/12 1612 Last data filed at 06/21/12 1503  Gross per 24 hour  Intake 1626.67 ml  Output    900 ml  Net 726.67 ml   Filed Weights   06/20/12 2145  Weight: 67.132 kg (148 lb)    Exam:   General:  Elderly female in NAD  HEENT: no pallor, moist oral mucosa  Cardiovascular: NS1&S2, no murmurs  Respiratory: clear b/l no added sounds  Abdomen: soft, NT, ND BS+  Ext: warm, limited ROM over left leg.   CNS: AAOX 3   Data  Reviewed: Basic Metabolic Panel:  Lab 06/21/12 9629 06/20/12 1234  NA 138 140  K 3.8 3.6  CL 107 104  CO2 26 25  GLUCOSE 104* 120*  BUN 10 13  CREATININE 0.88 0.95  CALCIUM 8.7 9.4  MG -- --  PHOS -- --   Liver Function Tests:  Lab 06/20/12 1234  AST 18  ALT 14  ALKPHOS 66  BILITOT 0.7  PROT 6.4  ALBUMIN 3.7   No results found for this basename: LIPASE:5,AMYLASE:5 in the last 168 hours No results found for this basename: AMMONIA:5 in the last 168 hours CBC:  Lab 06/21/12 0345 06/20/12 1234  WBC 8.6 15.5*  NEUTROABS -- --  HGB 13.5 15.0  HCT 39.5 42.4  MCV 88.4 87.6  PLT 213 236   Cardiac Enzymes: No results found for this basename: CKTOTAL:5,CKMB:5,CKMBINDEX:5,TROPONINI:5 in the last 168 hours BNP (last 3 results) No results found for this basename: PROBNP:3 in the last 8760 hours CBG: No results found for this basename: GLUCAP:5 in the last 168 hours  No results found for this or any previous visit (from the past 240 hour(s)).   Studies: Dg Chest 1 View  06/20/2012  *RADIOLOGY REPORT*  Clinical Data: Fall and left hip pain.  CHEST - 1 VIEW  Comparison: None.  Findings: Single view of the  chest demonstrates a large retrocardiac opacification.  Findings are suggestive for a very large hiatal hernia.  Otherwise, the lungs are clear.  Heart size is within normal limits and the trachea is midline.  Prominent right paratracheal density could be vascular in etiology or could represent thyroid tissue.  IMPRESSION: Large retrocardiac opacification is most consistent with a large hiatal hernia.  No acute chest findings.   Original Report Authenticated By: Sarah Moyer, M.D.    Dg Hip Complete Left  06/20/2012  *RADIOLOGY REPORT*  Clinical Data: Fall, left hip pain  LEFT HIP - COMPLETE 2+ VIEW  Comparison: None.  Findings: Transcervical left femoral neck fracture.  Mild foreshortening/proximal migration.  No additional fracture is seen.  Bilateral hip joint spaces are mildly  narrowed but symmetric.  Degenerative changes of the lower lumbar spine.  IMPRESSION: Transcervical left femoral neck fracture, as described above.   Original Report Authenticated By: Sarah Moyer, M.D.     Scheduled Meds:   . aspirin EC  81 mg Oral Daily  . calcium-vitamin D  2 tablet Oral Daily  .  ceFAZolin (ANCEF) IV  2 g Intravenous 60 min Pre-Op  . docusate sodium  100 mg Oral BID  . fentaNYL      . omega-3 acid ethyl esters  1 g Oral BID  . simvastatin  20 mg Oral q1800  . DISCONTD: Calcium  1,200 mg Oral Daily  . DISCONTD: enoxaparin (LOVENOX) injection  40 mg Subcutaneous Q24H  . DISCONTD: fish oil-omega-3 fatty acids  1 capsule Oral Daily   Continuous Infusions:   . sodium chloride 20 mL/hr (06/20/12 1152)  . sodium chloride Stopped (06/21/12 1554)  . lactated ringers 1,000 mL (06/21/12 1555)       Time spent: 30 minutes    Sarah Moyer  [facility] Pager [phone]. If 8PM-8AM, please contact night-coverage at [email or web address], password [facility] 06/21/2012, 4:12 PM  LOS: 1 day

## 2012-06-21 NOTE — Transfer of Care (Signed)
Immediate Anesthesia Transfer of Care Note  Patient: Sarah Moyer  Procedure(s) Performed: Procedure(s) (LRB): ARTHROPLASTY BIPOLAR HIP (Left)  Patient Location: PACU  Anesthesia Type: Regional and Spinal  Level of Consciousness: awake, sedated and patient cooperative  Airway & Oxygen Therapy: Patient Spontanous Breathing and Patient connected to face mask oxygen  Post-op Assessment: Report given to PACU RN and Post -op Vital signs reviewed and stable  Post vital signs: Reviewed and stable  Complications: No apparent anesthesia complications

## 2012-06-21 NOTE — Anesthesia Procedure Notes (Signed)
Spinal  Patient location during procedure: OR Staffing Anesthesiologist: Audria Takeshita Performed by: anesthesiologist  Preanesthetic Checklist Completed: patient identified, site marked, surgical consent, pre-op evaluation, timeout performed, IV checked, risks and benefits discussed and monitors and equipment checked Spinal Block Patient position: right lateral decubitus Prep: Betadine Patient monitoring: heart rate, continuous pulse ox and blood pressure Approach: right paramedian Location: L2-3 Injection technique: single-shot Needle Needle type: Spinocan  Needle gauge: 22 G Needle length: 9 cm Additional Notes Expiration date of kit checked and confirmed. Patient tolerated procedure well, without complications.     

## 2012-06-21 NOTE — Progress Notes (Signed)
Subjective:   Relatively comfortable waiting for left hip surgery  Left femoral neck fracture    Patient reports pain as moderate.  Objective:   VITALS:   Filed Vitals:   06/21/12 0800  BP:   Pulse:   Temp:   Resp: 16    Neurologically intact  LABS  Basename 06/21/12 0345 06/20/12 1234  HGB 13.5 15.0  HCT 39.5 42.4  WBC 8.6 15.5*  PLT 213 236     Basename 06/21/12 0345 06/20/12 1234  NA 138 140  K 3.8 3.6  BUN 10 13  CREATININE 0.88 0.95  GLUCOSE 104* 120*     Basename 06/20/12 1234  LABPT --  INR 1.10     Assessment/Plan:    NPO To OR this pm for left femoral neck fracture, left hip hemiarthroplasty

## 2012-06-21 NOTE — Op Note (Signed)
NAME:  Sarah Moyer, Sarah Moyer                ACCOUNT NO.:  [id number]   MEDICAL RECORD NO.: [id number]   LOCATION:  1435                         FACILITY:  [facility]   DATE OF BIRTH:  [birth year]  PHYSICIAN:  Madlyn Frankel. Charlann Boxer, M.D.     DATE OF PROCEDURE:  06/21/2012                               OPERATIVE REPORT     PREOPERATIVE DIAGNOSIS:  Left displaced femoral neck fracture.   POSTOPERATIVE DIAGNOSIS:  Left displaced femoral neck fracture.   PROCEDURE:  Left hip hemiarthroplasty utilizing DePuy component, size 3 standard Tri-Lock stem with a 47 unipolar ball with a +0 adapter.   SURGEON:  Madlyn Frankel. Charlann Boxer, MD   ASSISTANT:  Lanney Gins, PA-C.   ANESTHESIA:  General.   SPECIMENS:  None.   DRAINS:  One medium Hemovac.   BLOOD LOSS:  About 150 cc.   COMPLICATIONS:  None.   INDICATION OF PROCEDURE:  Sarah Moyer is a pleasant 78 year old female who lives Independently with her husband.  She unfortunately had a fall at her house.  She was admitted to the hospital after radiographs revealed a left femoral neck fracture.  She was seen and evaluated and was scheduled for surgery for fixation.  The necessity of surgical repair was discussed with she and her family.  Consent was obtained after reviewing risks of infection, DVT, component failure, and need for revision surgery.   PROCEDURE IN DETAIL:  The patient was brought to the operative theater. Once adequate anesthesia, preoperative antibiotics, Ancef administered, the patient was positioned into the right lateral decubitus position with the left side up.  The left lower extremity was then prepped and draped in sterile fashion.  A time-out was performed identifying the patient, planned procedure, and extremity.   A lateral incision was made off the proximal trochanter. Sharp dissection was carried down to the iliotibial band and gluteal fascia. The gluteal fascia was then incised for posterior approach.  The short external  rotators were taken down separate from the posterior capsule. An L capsulotomy was made preserving the posterior leaflet for later anatomic repair. Fracture site was identified and after removing comminuted segments of the posterior femoral neck, the femoral head was removed without difficulty and measured on the back table  using the sizing rings and determined to be 47 mm in diameter.   The proximal femur was then exposed.  Retractors placed.  I then drilled, opened the proximal femur.  Then I hand reamed once and  Irrigated the canal to try to prevent fat emboli.  I began broaching the femur with a 0 broach up to a size 3 broach with good medial and lateral metaphyseal fit without evidence of any torsion or movement.  A trial reduction was carried out with a standard neck and a +0 adapter with a 47 ball.  The hip reduced nicely.  The leg lengths appeared to be equal compared to the down leg.   The hip went through a range of motion without evidence of any subluxation or impingement.   Given these findings, the trial components removed.  The final 3 standard  Tri-Lock stem was opened.  After irrigating the canal, the final stem  was impacted and sat at the level where the broach was. Based on this and the trial reduction, a +0 adapter was opened and impacted in the 47 unipolar ball onto a clean and dry trunnion.  The hip had been irrigated throughout the case and again at this point.  I re- Approximated the posterior capsule to the superior leaflet using a  #1 Vicryl,  and placed a medium Hemovac drain deep.  The remainder of the wound was closed with #1 Vicryl in the iliotibial band and gluteal fascia, a  2-0 Vicryl in the sub-Q tissue and a running 4-0 Monocryl in the skin.  The hip was cleaned, dried, and dressed sterilely using Dermabond and Aquacel dressing.  Drain site was dressed separately.  She was then brought to recovery room, extubated in stable condition, tolerating the  procedure well.  Lanney Gins, PA-C was present and utilized as Geophysicist/field seismologist for the entire case from  Preoperative positioning to management of the contralateral extremity and retractors to  General facilitation of the procedure.  He was also involved with primary wound closure.         Madlyn Frankel Charlann Boxer, M.D.

## 2012-06-22 MED ORDER — ONDANSETRON HCL 4 MG/2ML IJ SOLN
INTRAMUSCULAR | Status: AC
  Filled 2012-06-22: qty 2

## 2012-06-22 MED ORDER — ONDANSETRON HCL 4 MG/2ML IJ SOLN
4.0000 mg | Freq: Four times a day (QID) | INTRAMUSCULAR | Status: DC | PRN
  Administered 2012-06-22: 4 mg via INTRAVENOUS

## 2012-06-22 MED ORDER — HYDROCODONE-ACETAMINOPHEN 5-325 MG PO TABS
1.0000 | ORAL_TABLET | ORAL | Status: DC | PRN
  Administered 2012-06-22 – 2012-06-23 (×2): 2 via ORAL
  Filled 2012-06-22: qty 2

## 2012-06-22 NOTE — Progress Notes (Signed)
   Subjective: 1 Day Post-Op Procedure(s) (LRB): ARTHROPLASTY BIPOLAR HIP (Left)   Patient reports pain as mild.  Feeling some soreness, but much better than the pain prior to surgery. No events throughout the night.  Objective:   VITALS:   Filed Vitals:   06/22/12 1047  BP: 127/63  Pulse: 76  Temp: 98.3 F (36.8 C)  Resp: 14    Neurovascular intact Dorsiflexion/Plantar flexion intact Incision: dressing C/D/I No cellulitis present Compartment soft  LABS  Basename 06/21/12 0345 06/20/12 1234  HGB 13.5 15.0  HCT 39.5 42.4  WBC 8.6 15.5*  PLT 213 236     Basename 06/21/12 0345 06/20/12 1234  NA 138 140  K 3.8 3.6  BUN 10 13  CREATININE 0.88 0.95  GLUCOSE 104* 120*     Assessment/Plan: 1 Day Post-Op Procedure(s) (LRB): ARTHROPLASTY BIPOLAR HIP (Left)  HV drain d/c'ed Advance diet Up with therapy To be determined whether the patient goes to SNF versus home.  Anastasio Auerbach Milan Clare   PAC  06/22/2012, 12:41 PM

## 2012-06-22 NOTE — Progress Notes (Signed)
[facility] PROGRESS NOTE  Sarah KRUMMEL BJY:[id number] DOB: 1930/06/30 DOA: 06/20/2012 PCP: Hoyle Sauer, MD  Assessment/Plan:  Left femoral neck fracture with dispalcement  -Due to mechanical fall  -s/p left hip arthroplasty on 8/26. Tolerated surgery well. HV drain dced -PT eval thi sam. Recommends SNF -cont pain control and DVT prophylaxis per ortho  Hyperlipidemia  -Continue home dose of simvastatin 20 mg daily  -Continue fish oil   Hypertension  - diet controlled  -Continue aspirin 81 mg daily   Osteopenia  -Patient takes vitamin D and calcium supplementation  -vitamin D levels wnl   Right paratracheal density  -Will need outpatient followup   Leukocytosis  -Likely reactive, now resolved   LBBB  -present on EKG on Nov. 2011  -currently asymptomatic   Code Status: full  Family Communication: at bedside  Disposition Plan: likely SNF in 1-2 days . Patient wants to go home.   Brief narrative:  Pleasant 78 year old female who was at church this morning when she was walking down stairs and missed a step with transcervical left femoral neck fracture.   Consultants:  Charlann Boxer ( ortho)  Procedures:  Left hip arthroplasty on 8/26 Antibiotics:  none   HPI/Subjective: Feels better after the surgery. Pain controlled on current meds  Objective: Filed Vitals:   06/22/12 0000 06/22/12 0151 06/22/12 0610 06/22/12 1047  BP:  138/73 149/76 127/63  Pulse:  94 81 76  Temp:  97.9 F (36.6 C) 98.3 F (36.8 C) 98.3 F (36.8 C)  TempSrc:  Oral Oral Oral  Resp: 16 14 14 14   Height:      Weight:      SpO2: 92% 92% 93% 91%    Intake/Output Summary (Last 24 hours) at 06/22/12 1313 Last data filed at 06/22/12 1048  Gross per 24 hour  Intake   4071 ml  Output   2350 ml  Net   1721 ml   Filed Weights   06/20/12 2145  Weight: 67.132 kg (148 lb)    Exam:   General: elderly female in NAD  Heent: No pallor, moist oral mucosa  Cardiovascular: NS1&S2,  no mrumurs  Respiratory: clear b/l, no added sounds  Abdomen: soft, NT, ND BS+  EXT: dressing overleft hip intact, minimal swelling, foley in place  CNS: AAX 3 , non focal  Data Reviewed: Basic Metabolic Panel:  Lab 06/21/12 0865 06/20/12 1234  NA 138 140  K 3.8 3.6  CL 107 104  CO2 26 25  GLUCOSE 104* 120*  BUN 10 13  CREATININE 0.88 0.95  CALCIUM 8.7 9.4  MG -- --  PHOS -- --   Liver Function Tests:  Lab 06/20/12 1234  AST 18  ALT 14  ALKPHOS 66  BILITOT 0.7  PROT 6.4  ALBUMIN 3.7   No results found for this basename: LIPASE:5,AMYLASE:5 in the last 168 hours No results found for this basename: AMMONIA:5 in the last 168 hours CBC:  Lab 06/21/12 0345 06/20/12 1234  WBC 8.6 15.5*  NEUTROABS -- --  HGB 13.5 15.0  HCT 39.5 42.4  MCV 88.4 87.6  PLT 213 236   Cardiac Enzymes: No results found for this basename: CKTOTAL:5,CKMB:5,CKMBINDEX:5,TROPONINI:5 in the last 168 hours BNP (last 3 results) No results found for this basename: PROBNP:3 in the last 8760 hours CBG: No results found for this basename: GLUCAP:5 in the last 168 hours  Recent Results (from the past 240 hour(s))  SURGICAL PCR SCREEN     Status: Normal   Collection  Time   06/21/12  3:35 PM      Component Value Range Status Comment   MRSA, PCR NEGATIVE  NEGATIVE Final    Staphylococcus aureus NEGATIVE  NEGATIVE Final      Studies: Dg Chest 1 View  06/20/2012  *RADIOLOGY REPORT*  Clinical Data: Fall and left hip pain.  CHEST - 1 VIEW  Comparison: None.  Findings: Single view of the chest demonstrates a large retrocardiac opacification.  Findings are suggestive for a very large hiatal hernia.  Otherwise, the lungs are clear.  Heart size is within normal limits and the trachea is midline.  Prominent right paratracheal density could be vascular in etiology or could represent thyroid tissue.  IMPRESSION: Large retrocardiac opacification is most consistent with a large hiatal hernia.  No acute chest  findings.   Original Report Authenticated By: Richarda Overlie, M.D.    Dg Hip Complete Left  06/20/2012  *RADIOLOGY REPORT*  Clinical Data: Fall, left hip pain  LEFT HIP - COMPLETE 2+ VIEW  Comparison: None.  Findings: Transcervical left femoral neck fracture.  Mild foreshortening/proximal migration.  No additional fracture is seen.  Bilateral hip joint spaces are mildly narrowed but symmetric.  Degenerative changes of the lower lumbar spine.  IMPRESSION: Transcervical left femoral neck fracture, as described above.   Original Report Authenticated By: Charline Bills, M.D.    Dg Pelvis Portable  06/21/2012  *RADIOLOGY REPORT*  Clinical Data: Status post left hip replacement.  PORTABLE PELVIS  Comparison: Plain films 06/20/2012.  Findings: The patient has a new bipolar left hip hemiarthroplasty. There is some gas and soft tissues and a surgical drain is in place.  The device is located and there is no fracture.  IMPRESSION: Left hip replacement without evidence of complication.   Original Report Authenticated By: Bernadene Bell. D'ALESSIO, M.D.    Dg Hip Portable 1 View Left  06/21/2012  *RADIOLOGY REPORT*  Clinical Data: Status post left hip replacement.  PORTABLE LEFT HIP - 1 VIEW  Comparison: Plain films 06/20/2012.  Findings: New bipolar left hip hemiarthroplasty is identified.  No fracture is seen.  Surgical drain is noted.  IMPRESSION: Left hip replacement without evidence of of complication.   Original Report Authenticated By: Bernadene Bell. D'ALESSIO, M.D.     Scheduled Meds:   . calcium-vitamin D  2 tablet Oral Daily  .  ceFAZolin (ANCEF) IV  2 g Intravenous 60 min Pre-Op  .  ceFAZolin (ANCEF) IV  2 g Intravenous Q8H  . docusate sodium  100 mg Oral BID  . enoxaparin (LOVENOX) injection  40 mg Subcutaneous Q24H  . omega-3 acid ethyl esters  1 g Oral BID  . ondansetron      . polyethylene glycol  17 g Oral BID  . simvastatin  20 mg Oral q1800  . DISCONTD: aspirin EC  81 mg Oral Daily   Continuous  Infusions:   . sodium chloride 100 mL/hr at 06/22/12 1258  . DISCONTD: sodium chloride 20 mL/hr (06/20/12 1152)  . DISCONTD: sodium chloride Stopped (06/21/12 1554)  . DISCONTD: lactated ringers    . DISCONTD: lactated ringers        Time spent: 30 minutes    Kolton Kienle  [facility] Pager [phone]. If 8PM-8AM, please contact night-coverage at [email or web address], password [facility] 06/22/2012, 1:13 PM  LOS: 2 days

## 2012-06-22 NOTE — Evaluation (Signed)
Physical Therapy Evaluation Patient Details Name: Sarah Moyer MRN: [id number] DOB: 1930/10/27 Today's Date: 06/22/2012 Time: 7829-5621 PT Time Calculation (min): 26 min  PT Assessment / Plan / Recommendation Clinical Impression  Pt s/p L hip hemiarthroplasty after sustaining hip fracture from fall at church.  Pt would benefit from acute PT services in order to improve independence with transfers and ambulation to prepare for d/c.  Recommend SNF prior to home at this time however if pt progressing well will update.    PT Assessment  Patient needs continued PT services    Follow Up Recommendations  Skilled nursing facility    Barriers to Discharge        Equipment Recommendations  Defer to next venue    Recommendations for Other Services     Frequency 7X/week    Precautions / Restrictions Precautions Precautions: Posterior Hip Restrictions LLE Weight Bearing: Weight bearing as tolerated   Pertinent Vitals/Pain No pain with rest, reports minimal L hip pain with WBing      Mobility  Bed Mobility Bed Mobility: Supine to Sit Supine to Sit: 4: Min assist;HOB elevated;With rails Details for Bed Mobility Assistance: assist for supporting L LE, verbal cues for technique, increased time Transfers Transfers: Stand to Sit;Sit to Stand Sit to Stand: 1: +2 Total assist;With upper extremity assist;From bed Sit to Stand: Patient Percentage: 60% Stand to Sit: 1: +2 Total assist;With upper extremity assist;To chair/3-in-1 Stand to Sit: Patient Percentage: 80% Details for Transfer Assistance: assist to rise and steady, assist for L LE forward upon sitting, verbal cues for safe technique Ambulation/Gait Ambulation/Gait Assistance: 1: +2 Total assist Ambulation/Gait: Patient Percentage: 60% Ambulation Distance (Feet): 14 Feet Assistive device: Rolling walker Ambulation/Gait Assistance Details: pt required less assist as distance increased, verbal cues for sequence and safe use of  RW, reports minimal pain with WBing Gait Pattern: Step-to pattern;Antalgic Gait velocity: decreased    Exercises     PT Diagnosis: Difficulty walking;Acute pain  PT Problem List: Decreased strength;Decreased mobility;Decreased activity tolerance;Decreased knowledge of use of DME;Decreased safety awareness;Decreased knowledge of precautions;Pain PT Treatment Interventions: DME instruction;Gait training;Functional mobility training;Therapeutic activities;Therapeutic exercise;Stair training;Balance training;Patient/family education   PT Goals Acute Rehab PT Goals PT Goal Formulation: With patient Time For Goal Achievement: 06/29/12 Potential to Achieve Goals: Good Pt will go Supine/Side to Sit: with supervision PT Goal: Supine/Side to Sit - Progress: Goal set today Pt will go Sit to Supine/Side: with supervision PT Goal: Sit to Supine/Side - Progress: Goal set today Pt will go Sit to Stand: with supervision PT Goal: Sit to Stand - Progress: Goal set today Pt will go Stand to Sit: with supervision PT Goal: Stand to Sit - Progress: Goal set today Pt will Ambulate: 51 - 150 feet;with supervision;with least restrictive assistive device PT Goal: Ambulate - Progress: Goal set today Pt will Perform Home Exercise Program: with supervision, verbal cues required/provided PT Goal: Perform Home Exercise Program - Progress: Goal set today  Visit Information  Last PT Received On: 06/22/12 Assistance Needed: +2    Subjective Data  Subjective: Ok I'm ready for you.   Prior Functioning  Home Living Lives With: Spouse Additional Comments: Pt uncertain about d/c.  Recommend ST-SNF prior to home. Prior Function Level of Independence: Independent Communication Communication: No difficulties    Cognition  Overall Cognitive Status: Appears within functional limits for tasks assessed/performed Arousal/Alertness: Awake/alert Orientation Level: Appears intact for tasks assessed Behavior During  Session: [facility] for tasks performed    Extremity/Trunk Assessment Right Upper  Extremity Assessment RUE ROM/Strength/Tone: [facility] for tasks assessed Left Upper Extremity Assessment LUE ROM/Strength/Tone: [facility] for tasks assessed Right Lower Extremity Assessment RLE ROM/Strength/Tone: [facility] for tasks assessed Left Lower Extremity Assessment LLE ROM/Strength/Tone: Deficits LLE ROM/Strength/Tone Deficits: decreased hip strength observed with transfers   Balance    End of Session PT - End of Session Equipment Utilized During Treatment: Gait belt Activity Tolerance: Patient tolerated treatment well Patient left: in chair;with call bell/phone within reach;with family/visitor present  GP     Marcial Pless,KATHrine E 06/22/2012, 12:18 PM Pager: [phone]

## 2012-06-22 NOTE — Progress Notes (Signed)
Physical Therapy Treatment Note   06/22/12 1500  PT Visit Information  Last PT Received On 06/22/12  Assistance Needed +1  PT Time Calculation  PT Start Time 1426  PT Stop Time 1452  PT Time Calculation (min) 26 min  Subjective Data  Subjective I'm really tired, so I'm glad you came back to help me to bed when you did.  Precautions  Precautions Posterior Hip  Precaution Comments handout posted in room  Restrictions  LLE Weight Bearing WBAT  Cognition  Overall Cognitive Status Appears within functional limits for tasks assessed/performed  Bed Mobility  Bed Mobility Sit to Supine  Sit to Supine 4: Min assist;HOB flat  Details for Bed Mobility Assistance assist for L LE, verbal cues for technique  Transfers  Transfers Stand to Sit;Sit to Stand;Stand Pivot Transfers  Sit to Stand 4: Min assist;With upper extremity assist;From chair/3-in-1  Stand to Sit 4: Min assist;With upper extremity assist;To bed  Stand Pivot Transfers 4: Min assist  Details for Transfer Assistance verbal cues for safe technique, assist to rise and steady and then control descent, pt took a couple steps with increased cues for technique back over to bed  Exercises  Exercises Total Joint  Total Joint Exercises  Ankle Circles/Pumps AROM;Both;20 reps;Supine  Quad Sets AROM;Both;20 reps  [place];Left  Heel Slides AROM;Strengthening;Left;10 reps;Limitations  Hip ABduction/ADduction AROM;Strengthening;Left;15 reps  Heel Slides Limitations verbal cues for hip precautions  PT - End of Session  Equipment Utilized During Treatment Gait belt  Activity Tolerance Patient tolerated treatment well  Patient left in bed;with call bell/phone within reach  PT - Assessment/Plan  Comments on Treatment Session Pt assisted back to bed and performed exercises.  Pt did better with mobility during PM session however still continue to recommend ST-SNF prior to d/c home.  PT Plan Discharge plan remains  appropriate;Frequency remains appropriate  Follow Up Recommendations Skilled nursing facility  Equipment Recommended Defer to next venue  Acute Rehab PT Goals  PT Goal: Sit to Supine/Side - Progress Progressing toward goal  PT Goal: Sit to Stand - Progress Progressing toward goal  PT Goal: Stand to Sit - Progress Progressing toward goal  PT Goal: Perform Home Exercise Program - Progress Progressing toward goal  PT General Charges  $$ ACUTE PT VISIT 1 Procedure  PT Treatments  $Therapeutic Exercise 8-22 mins  $Therapeutic Activity 8-22 mins    Zenovia Jarred, PT Pager: [phone]

## 2012-06-22 NOTE — Progress Notes (Signed)
INITIAL ADULT NUTRITION ASSESSMENT Date: 06/22/2012   Time: 3:56 PM Reason for Assessment: Fall and hip injury and left femoral neck injury  ASSESSMENT: Female 78 y.o.  Dx: Mechanical fall and left hip   Hx:  Past Medical History  Diagnosis Date  . Hiatal hernia   . Hyperlipidemia   . Hypertension   . DDD (degenerative disc disease)   . Diverticular disease     With colonic polyps  . Osteopenia     Related Meds:  Scheduled Meds:   . calcium-vitamin D  2 tablet Oral Daily  .  ceFAZolin (ANCEF) IV  2 g Intravenous 60 min Pre-Op  .  ceFAZolin (ANCEF) IV  2 g Intravenous Q8H  . docusate sodium  100 mg Oral BID  . enoxaparin (LOVENOX) injection  40 mg Subcutaneous Q24H  . omega-3 acid ethyl esters  1 g Oral BID  . ondansetron      . polyethylene glycol  17 g Oral BID  . simvastatin  20 mg Oral q1800  . DISCONTD: aspirin EC  81 mg Oral Daily   Continuous Infusions:   . DISCONTD: sodium chloride 20 mL/hr (06/20/12 1152)  . DISCONTD: sodium chloride Stopped (06/21/12 1554)  . DISCONTD: sodium chloride 100 mL/hr at 06/22/12 1258  . DISCONTD: lactated ringers    . DISCONTD: lactated ringers     PRN Meds:.HYDROcodone-acetaminophen, HYDROmorphone (DILAUDID) injection, ondansetron, DISCONTD: 0.9 % irrigation (POUR BTL), DISCONTD:  HYDROmorphone (DILAUDID) injection, DISCONTD: meperidine (DEMEROL) injection, DISCONTD: promethazine   Ht: 5\' 4"  (162.6 cm)  Wt: 148 lb (67.132 kg)  Ideal Wt: 54.7 kg  % Ideal Wt: 123% Wt Readings from Last 10 Encounters:  06/20/12 148 lb (67.132 kg)  06/20/12 148 lb (67.132 kg)  09/24/10 152 lb (68.947 kg)  09/12/10 148 lb (67.132 kg)    Body mass index is 25.40 kg/(m^2). (Overweight)  Food/Nutrition Related Hx: Patient reported poor appetite and intake. She stated she was eating well PTA and expects her appetite to pick back up tomorrow. She stated she has been feeling nausea today. PO intake documented 40% at meals. Patient denies the need  for nutrition supplement but agreed to try magic up.   Labs:  CMP     Component Value Date/Time   NA 138 06/21/2012 0345   K 3.8 06/21/2012 0345   CL 107 06/21/2012 0345   CO2 26 06/21/2012 0345   GLUCOSE 104* 06/21/2012 0345   BUN 10 06/21/2012 0345   CREATININE 0.88 06/21/2012 0345   CALCIUM 8.7 06/21/2012 0345   PROT 6.4 06/20/2012 1234   ALBUMIN 3.7 06/20/2012 1234   AST 18 06/20/2012 1234   ALT 14 06/20/2012 1234   ALKPHOS 66 06/20/2012 1234   BILITOT 0.7 06/20/2012 1234   GFRNONAA 61* 06/21/2012 0345   GFRAA 71* 06/21/2012 0345    Intake/Output Summary (Last 24 hours) at 06/22/12 1559 Last data filed at 06/22/12 1350  Gross per 24 hour  Intake   4001 ml  Output   2550 ml  Net   1451 ml     Diet Order: General  Supplements/Tube Feeding: none at this time.   IVF:    DISCONTD: sodium chloride Last Rate: 20 mL/hr (06/20/12 1152)  DISCONTD: sodium chloride Last Rate: Stopped (06/21/12 1554)  DISCONTD: sodium chloride Last Rate: 100 mL/hr at 06/22/12 1258  DISCONTD: lactated ringers   DISCONTD: lactated ringers     Estimated Nutritional Needs:   Kcal: 1610-9604 Protein: 74-87 grams  Fluid: 1 ml per kcal  intake   NUTRITION DIAGNOSIS: -Inadequate oral intake (NI-2.1).  Status: Ongoing  RELATED TO: poor appetite and nausea  AS EVIDENCE BY: PO intake of 40% at meals.   MONITORING/EVALUATION(Goals): PO intake, weights, labs 1. PO intake > 75% at meals and snacks.   EDUCATION NEEDS: -No education needs identified at this time  INTERVENTION: 1. Will order patient magic cup once daily to promote increased intake.  2. RD to follow for nutrition plan of care.   Dietitian [phone]  DOCUMENTATION CODES Per approved criteria  -Not Applicable    Iven Finn [facility] 06/22/2012, 3:56 PM

## 2012-06-23 ENCOUNTER — Inpatient Hospital Stay
Admission: RE | Admit: 2012-06-23 | Discharge: 2012-07-08 | Disposition: A | Discharge status: Home / Self Care | Payer: Medicare Other | Source: Ambulatory Visit | Attending: Internal Medicine | Admitting: Internal Medicine

## 2012-06-23 DIAGNOSIS — M949 Disorder of cartilage, unspecified: Secondary | ICD-10-CM

## 2012-06-23 DIAGNOSIS — D72829 Elevated white blood cell count, unspecified: Secondary | ICD-10-CM

## 2012-06-23 DIAGNOSIS — I10 Essential (primary) hypertension: Secondary | ICD-10-CM

## 2012-06-23 MED ORDER — HYDROCODONE-ACETAMINOPHEN 5-325 MG PO TABS: 1.0000 | ORAL_TABLET | ORAL | Status: AC | PRN

## 2012-06-23 MED ORDER — ASPIRIN EC 325 MG PO TBEC: 325.0000 mg | DELAYED_RELEASE_TABLET | Freq: Every day | ORAL | Status: AC

## 2012-06-23 MED ORDER — DSS 100 MG PO CAPS: 100.0000 mg | ORAL_CAPSULE | Freq: Two times a day (BID) | ORAL | Status: AC

## 2012-06-23 MED ORDER — ENOXAPARIN SODIUM 40 MG/0.4ML ~~LOC~~ SOLN: 40.0000 mg | SUBCUTANEOUS | Status: DC

## 2012-06-23 MED ORDER — POLYETHYLENE GLYCOL 3350 17 G PO PACK: 17.0000 g | PACK | Freq: Two times a day (BID) | ORAL | Status: AC

## 2012-06-23 MED ORDER — CALCIUM CARBONATE-VITAMIN D 500-200 MG-UNIT PO TABS: 2.0000 | ORAL_TABLET | Freq: Every day | ORAL | Status: DC

## 2012-06-23 NOTE — Progress Notes (Signed)
Physical Therapy Treatment Patient Details Name: BORA BOST MRN: [id number] DOB: Nov 11, 1929 Today's Date: 06/23/2012 Time: 4098-1191 PT Time Calculation (min): 18 min  PT Assessment / Plan / Recommendation Comments on Treatment Session  Pt able to increase ambulation distance today with min assist.    Follow Up Recommendations  Skilled nursing facility    Barriers to Discharge        Equipment Recommendations  Defer to next venue    Recommendations for Other Services    Frequency     Plan Discharge plan remains appropriate;Frequency remains appropriate    Precautions / Restrictions Precautions Precautions: Posterior Hip Precaution Comments: pt able to recall 2/3 hip precautions Restrictions Weight Bearing Restrictions: No LLE Weight Bearing: Weight bearing as tolerated   Pertinent Vitals/Pain No pain, ice applied, premedicated    Mobility  Bed Mobility Bed Mobility: Sit to Supine Sit to Supine: 4: Min assist;HOB flat Details for Bed Mobility Assistance: assist for L LE, verbal cues for technique Transfers Transfers: Stand to Sit;Sit to Stand;Stand Pivot Transfers Sit to Stand: 4: Min assist;With upper extremity assist;From chair/3-in-1 Stand to Sit: 4: Min assist;With upper extremity assist;To bed Details for Transfer Assistance: verbal cues for safe technique following precautions Ambulation/Gait Ambulation/Gait Assistance: 4: Min assist Ambulation Distance (Feet): 40 Feet Assistive device: Rolling walker Ambulation/Gait Assistance Details: verbal cues for sequence and safe RW distance Gait Pattern: Step-to pattern;Decreased stance time - left;Antalgic Gait velocity: very slow    Exercises     PT Diagnosis:    PT Problem List:   PT Treatment Interventions:     PT Goals Acute Rehab PT Goals PT Goal: Sit to Supine/Side - Progress: Progressing toward goal PT Goal: Sit to Stand - Progress: Progressing toward goal PT Goal: Stand to Sit - Progress:  Progressing toward goal PT Goal: Ambulate - Progress: Progressing toward goal  Visit Information  Last PT Received On: 06/23/12 Assistance Needed: +1    Subjective Data  Subjective: I want to do whatever I need to to get better.   Cognition  Overall Cognitive Status: Appears within functional limits for tasks assessed/performed Arousal/Alertness: Awake/alert Orientation Level: Appears intact for tasks assessed Behavior During Session: [facility] for tasks performed    Balance  Balance Balance Assessed: Yes Dynamic Standing Balance Dynamic Standing - Level of Assistance: 4: Min assist  End of Session PT - End of Session Equipment Utilized During Treatment: Gait belt Activity Tolerance: Patient tolerated treatment well Patient left: in bed;with call bell/phone within reach;with family/visitor present   GP     Luvena Wentling,KATHrine E 06/23/2012, 11:49 AM Pager: [phone]

## 2012-06-23 NOTE — Discharge Summary (Signed)
Physician Discharge Summary  SHATARA STANEK ZOX:[id number] DOB: 12/11/1929 DOA: 06/20/2012  PCP: Hoyle Sauer, MD  Admit date: 06/20/2012 Discharge date: 06/23/2012  Recommendations for Outpatient Follow-up:  1. Follow with Ortho service as instructed for post-surgical evaluation and further eval/treatment if needed.  Discharge Diagnoses:  Active Problems:  Hyperlipidemia  Osteopenia  Leukocytosis  Fracture of femoral neck, left, closed   Discharge Condition: Stable and improved. Pain well controlled and patient w/o any other acute complaints; will be discharge to [facility] center for physical rehab.  Diet recommendation: heart healthy diet  Filed Weights   06/20/12 2145  Weight: 67.132 kg (148 lb)    History of present illness:  78 year old female who was at church this morning when she was walking down stairs and missed a step. The patient fell onto her left hip after which she experienced significant pain with movement. There was no syncope. The patient was in her usual state of health prior to this fall. The patient denied any presyncopal type symptoms, palpitations, dizziness, visual changes, headache, chest discomfort, SOB. There has not been any fevers, chills, nausea, vomiting, diarrhea. The patient states that she normally does not have any difficulty with ambulation. There is no history of ataxia or gait disturbance. There is no dysuria, hematuria, abdominal pain .Of note, the patient has a history of hypertension that is diet controlled. In the emergency department, x-rays revealed a left transcervical femoral neck fracture. The family requested Dr. Charlann Boxer to be consulted for orthopedics.    Hospital Course:   1-Left femoral neck fracture with dispalcement  -Due to mechanical fall  -s/p left hip arthroplasty on 8/26. Tolerated surgery well. Now with well controlled pain and with bed available for physical rehab at SNF. -cont PRN pain meds and DVT prophylaxis as recommended  by ortho   2-Hyperlipidemia  -Continue home dose of simvastatin 20 mg daily  And Continue fish oil   3-Hypertension  - well control without meds; continue heart healthy diet -Continue aspirin    4-Osteopenia  -Patient takes vitamin D and calcium supplementation  -vitamin D levels wnl   5-Right paratracheal density  -Will need outpatient followup by PCP; most likely due to hiatal hernia and thyroid shadow.  6-Leukocytosis  -Likely reactive, resolved and WNL at discharge. -Source or signs of infection not identified.  7-LBBB  Unchanged, present on EKG on Nov. 2011  -Patient currently asymptomatic    Procedures:  Left hip arthroplasty on 8/26  Consultations:  Ortho  Discharge Exam: Filed Vitals:   06/23/12 1200  BP:   Pulse:   Temp:   Resp: 14   Filed Vitals:   06/23/12 0400 06/23/12 0652 06/23/12 0800 06/23/12 1200  BP:  139/77    Pulse:  90    Temp:  98.7 F (37.1 C)    TempSrc:  Oral    Resp: 14 14 14 14   Height:      Weight:      SpO2:  90% 90% 93%    General: NAD; cooperative, afebrile and with well controlled pain. Cardiovascular:S1 and S2; no rubs or gallops Respiratory: CTA bilaterally Abdomen: soft, NT, ND, positive BS Extremities: no edema, no cyanosis, no clubbing Neurology: non focal  Discharge Instructions  Discharge Orders    Future Orders Please Complete By Expires   Diet - low sodium heart healthy      Call MD / Call 911      Comments:   If you experience chest pain or shortness of breath, CALL  911 and be transported to the hospital emergency room.  If you develope a fever above 101 F, pus (white drainage) or increased drainage or redness at the wound, or calf pain, call your surgeon's office.   Discharge instructions      Comments:   Maintain surgical dressing for 8 days, then replace with gauze and tape. Keep the area dry and clean until follow up. Follow up in 2 weeks at [facility]. Call with any questions or  concerns.   Constipation Prevention      Comments:   Drink plenty of fluids.  Prune juice may be helpful.  You may use a stool softener, such as Colace (over the counter) 100 mg twice a day.  Use MiraLax (over the counter) for constipation as needed.   Increase activity slowly as tolerated      Driving restrictions      Comments:   No driving for 4 weeks   Change dressing      Comments:   Maintain surgical dressing for 8 days, then replace with 4x4 guaze and tape. Keep the area dry and clean.   TED hose      Comments:   Use stockings (TED hose) for 2 weeks on both leg(s).  You may remove them at night for sleeping.   Increase activity slowly      Discharge instructions      Comments:   -Keep patient well hydrated -Follow wound care instructions -Take medications as prescribed -Follow with orthopedic service as instructed -Increase activity slowly.     Medication List  As of 06/23/2012  1:20 PM   STOP taking these medications         CALCIUM PO         TAKE these medications         aspirin EC 325 MG tablet   Take 1 tablet (325 mg total) by mouth daily. X 4 weeks      calcium-vitamin D 500-200 MG-UNIT per tablet   Commonly known as: OSCAL WITH D   Take 2 tablets by mouth daily.      DSS 100 MG Caps   Take 100 mg by mouth 2 (two) times daily.      enoxaparin 40 MG/0.4ML injection   Commonly known as: LOVENOX   Inject 0.4 mLs (40 mg total) into the skin daily.      fish oil-omega-3 fatty acids 1000 MG capsule   Take 1 capsule by mouth daily.      HYDROcodone-acetaminophen 5-325 MG per tablet   Commonly known as: NORCO/VICODIN   Take 1-2 tablets by mouth every 4 (four) hours as needed for pain.      polyethylene glycol packet   Commonly known as: MIRALAX / GLYCOLAX   Take 17 g by mouth 2 (two) times daily.      simvastatin 20 MG tablet   Commonly known as: ZOCOR   Take 20 mg by mouth daily.           Follow-up Information    Follow up with Shelda Pal,  MD. Schedule an appointment as soon as possible for a visit in 2 weeks.   Contact information:   [facility] [address], [address] [place] [address] [phone]           The results of significant diagnostics from this hospitalization (including imaging, microbiology, ancillary and laboratory) are listed below for reference.    Significant Diagnostic Studies: Dg Chest 1 View  06/20/2012  *RADIOLOGY REPORT*  Clinical Data: Fall and left hip pain.  CHEST - 1 VIEW  Comparison: None.  Findings: Single view of the chest demonstrates a large retrocardiac opacification.  Findings are suggestive for a very large hiatal hernia.  Otherwise, the lungs are clear.  Heart size is within normal limits and the trachea is midline.  Prominent right paratracheal density could be vascular in etiology or could represent thyroid tissue.  IMPRESSION: Large retrocardiac opacification is most consistent with a large hiatal hernia.  No acute chest findings.   Original Report Authenticated By: Richarda Overlie, M.D.    Dg Hip Complete Left  06/20/2012  *RADIOLOGY REPORT*  Clinical Data: Fall, left hip pain  LEFT HIP - COMPLETE 2+ VIEW  Comparison: None.  Findings: Transcervical left femoral neck fracture.  Mild foreshortening/proximal migration.  No additional fracture is seen.  Bilateral hip joint spaces are mildly narrowed but symmetric.  Degenerative changes of the lower lumbar spine.  IMPRESSION: Transcervical left femoral neck fracture, as described above.   Original Report Authenticated By: Charline Bills, M.D.    Dg Pelvis Portable  06/21/2012  *RADIOLOGY REPORT*  Clinical Data: Status post left hip replacement.  PORTABLE PELVIS  Comparison: Plain films 06/20/2012.  Findings: The patient has a new bipolar left hip hemiarthroplasty. There is some gas and soft tissues and a surgical drain is in place.  The device is located and there is no fracture.  IMPRESSION: Left hip  replacement without evidence of complication.   Original Report Authenticated By: Bernadene Bell. D'ALESSIO, M.D.    Dg Hip Portable 1 View Left  06/21/2012  *RADIOLOGY REPORT*  Clinical Data: Status post left hip replacement.  PORTABLE LEFT HIP - 1 VIEW  Comparison: Plain films 06/20/2012.  Findings: New bipolar left hip hemiarthroplasty is identified.  No fracture is seen.  Surgical drain is noted.  IMPRESSION: Left hip replacement without evidence of of complication.   Original Report Authenticated By: Bernadene Bell. Maricela Curet, M.D.     Microbiology: Recent Results (from the past 240 hour(s))  SURGICAL PCR SCREEN     Status: Normal   Collection Time   06/21/12  3:35 PM      Component Value Range Status Comment   MRSA, PCR NEGATIVE  NEGATIVE Final    Staphylococcus aureus NEGATIVE  NEGATIVE Final      Labs: Basic Metabolic Panel:  Lab 06/21/12 1610 06/20/12 1234  NA 138 140  K 3.8 3.6  CL 107 104  CO2 26 25  GLUCOSE 104* 120*  BUN 10 13  CREATININE 0.88 0.95  CALCIUM 8.7 9.4  MG -- --  PHOS -- --   Liver Function Tests:  Lab 06/20/12 1234  AST 18  ALT 14  ALKPHOS 66  BILITOT 0.7  PROT 6.4  ALBUMIN 3.7   CBC:  Lab 06/21/12 0345 06/20/12 1234  WBC 8.6 15.5*  NEUTROABS -- --  HGB 13.5 15.0  HCT 39.5 42.4  MCV 88.4 87.6  PLT 213 236    Time coordinating discharge: > 30 minutes  Signed:  Cowan Pilar  [facility] 06/23/2012, 1:20 PM

## 2012-06-23 NOTE — Progress Notes (Signed)
Clinical Social Work Department BRIEF PSYCHOSOCIAL ASSESSMENT 06/23/2012  Patient:  Sarah Moyer, Sarah Moyer     Account Number:  [id number]     Admit date:  06/20/2012  Clinical Social Worker:  Candie Chroman  Date/Time:  06/23/2012 08:35 AM  Referred by:  Physician  Date Referred:  06/22/2012 Referred for  SNF Placement   Other Referral:   Interview type:  Patient Other interview type:    PSYCHOSOCIAL DATA Living Status:  HUSBAND Admitted from facility:   Level of care:   Primary support name:  Chauncey Fischer Primary support relationship to patient:  SPOUSE Degree of support available:   supportive    CURRENT CONCERNS Current Concerns  Post-Acute Placement   Other Concerns:    SOCIAL WORK ASSESSMENT / PLAN Pt is a 78 yr old female living at home prior to hospitalization. CSW met with pt/family to assist with d/c planning. PT is recommending ST SNF placement . Pt will consider this option. SNF search has seen initiated and bed offers will be provided today.   Assessment/plan status:  Psychosocial Support/Ongoing Assessment of Needs Other assessment/ plan:   HH services   Information/referral to community resources:   SNF list provided.    PATIENT'S/FAMILY'S RESPONSE TO PLAN OF CARE: Pt would prefer to return home with home health but will consider ST rehab. [facility] has been contacted at pt's request.    Cori Razor LCSW [phone]

## 2012-06-23 NOTE — Progress Notes (Signed)
Physical Therapy Treatment Note   06/23/12 1500  PT Visit Information  Last PT Received On 06/23/12  PT Time Calculation  PT Start Time 1443  PT Stop Time 1505  PT Time Calculation (min) 22 min  Subjective Data  Subjective I should use the bathroom before I leave. (d/c to SNF today)  Precautions  Precautions Posterior Hip  Restrictions  LLE Weight Bearing WBAT  Cognition  Overall Cognitive Status Appears within functional limits for tasks assessed/performed  Bed Mobility  Bed Mobility Supine to Sit;Sit to Supine  Supine to Sit 4: Min assist;HOB elevated  Sit to Supine HOB elevated;4: Min assist  Details for Bed Mobility Assistance assist for R LE to maintain precautions, pt attempted to move L LE however assist provided to keep precautions  Transfers  Transfers Sit to Stand;Stand to Sit  Sit to Stand 4: Min assist;With upper extremity assist;From chair/3-in-1  Stand to Sit 4: Min assist;With upper extremity assist;To bed;To chair/3-in-1  Details for Transfer Assistance pt assisted to bathroom, verbal cues for safe technique and maintaining precautions, assist to steady with rising and control descent  Ambulation/Gait  Ambulation/Gait Assistance 4: Min assist  Ambulation Distance (Feet) 8 Feet (x2)  Assistive device Rolling walker  Ambulation/Gait Assistance Details pt ambulated to bathroom and back to bed  Gait Pattern Step-to pattern;Decreased stance time - left;Antalgic  Total Joint Exercises  Ankle Circles/Pumps AROM;Both;20 reps;Supine  Quad Sets AROM;Both;20 reps  Heel Slides AROM;Strengthening;Left;Limitations;20 reps  Hip ABduction/ADduction AROM;Strengthening;Left;20 reps  Straight Leg Raises AROM;Strengthening;Left;15 reps;Supine  Heel Slides Limitations verbal cues for hip precautions  PT - End of Session  Activity Tolerance Patient tolerated treatment well  Patient left in bed;with call bell/phone within reach;with family/visitor present  PT - Assessment/Plan    Comments on Treatment Session Pt agreeable to perform exercises prior to d/c and also assisted to bathroom.  PT Plan Discharge plan remains appropriate;Frequency remains appropriate  Follow Up Recommendations Skilled nursing facility  Equipment Recommended Defer to next venue  Acute Rehab PT Goals  PT Goal: Supine/Side to Sit - Progress Progressing toward goal  PT Goal: Sit to Supine/Side - Progress Progressing toward goal  PT Goal: Sit to Stand - Progress Progressing toward goal  PT Goal: Stand to Sit - Progress Progressing toward goal  PT Goal: Ambulate - Progress Progressing toward goal  PT Goal: Perform Home Exercise Program - Progress Progressing toward goal  PT General Charges  $$ ACUTE PT VISIT 1 Procedure  PT Treatments  $Therapeutic Exercise 8-22 mins    Zenovia Jarred, PT Pager: [phone]

## 2012-06-23 NOTE — Progress Notes (Signed)
Clinical Social Work Department CLINICAL SOCIAL WORK PLACEMENT NOTE 06/23/2012  Patient:  Sarah Moyer, Sarah Moyer  Account Number:  [id number] Admit date:  06/20/2012  Clinical Social Worker:  ,  Date/time:     Clinical Social Work is seeking post-discharge placement for this patient at the following level of care:   SKILLED NURSING   (*CSW will update this form in Epic as items are completed)   06/23/2012  Patient/family provided with [facility] Department of Clinical Social Work's list of facilities offering this level of care within the geographic area requested by the patient (or if unable, by the patient's family).  06/23/2012  Patient/family informed of their freedom to choose among providers that offer the needed level of care, that participate in Medicare, Medicaid or managed care program needed by the patient, have an available bed and are willing to accept the patient.  06/23/2012  Patient/family informed of [facility]' ownership interest in [facility], as well as of the fact that they are under no obligation to receive care at this facility.  PASARR submitted to EDS on 06/23/2012 PASARR number received from EDS on 06/23/2012  FL2 transmitted to all facilities in geographic area requested by pt/family on  06/23/2012 FL2 transmitted to all facilities within larger geographic area on   Patient informed that his/her managed care company has contracts with or will negotiate with  certain facilities, including the following:     Patient/family informed of bed offers received:   Patient chooses bed at  Physician recommends and patient chooses bed at    Patient to be transferred to  on   Patient to be transferred to facility by   The following physician request were entered in Epic:   Additional Comments:  Cori Razor LCSW [phone]

## 2012-06-23 NOTE — Progress Notes (Signed)
   Subjective: 2 Days Post-Op Procedure(s) (LRB): ARTHROPLASTY BIPOLAR HIP (Left)   Patient reports pain as mild, pain well controlled. No events throughout the night.  Objective:   VITALS:   Filed Vitals:   06/23/12 0652  BP: 139/77  Pulse: 90  Temp: 98.7 F (37.1 C)  Resp: 14    Neurovascular intact Dorsiflexion/Plantar flexion intact Incision: dressing C/D/I No cellulitis present Compartment soft  LABS  Basename 06/21/12 0345 06/20/12 1234  HGB 13.5 15.0  HCT 39.5 42.4  WBC 8.6 15.5*  PLT 213 236     Basename 06/21/12 0345 06/20/12 1234  NA 138 140  K 3.8 3.6  BUN 10 13  CREATININE 0.88 0.95  GLUCOSE 104* 120*     Assessment/Plan: 2 Days Post-Op Procedure(s) (LRB): ARTHROPLASTY BIPOLAR HIP (Left)   Orthopaedically stable Rx written for Norco, Robaxin and Lovenox Ortho orders are written Aquacel dressing is to be maintained for 8-10 day and then replaced with gauze and tape Up with therapy Discharge to SNF whenever ready medically. Follow up in 2 weeks at [facility].  Follow-up Information    Follow up with OLIN,[place] Rosamond D in 2 weeks.   Contact information:   [facility] [address], [address] [place] [address] [phone]          Anastasio Auerbach. Beckett Hickmon   PAC  06/23/2012, 11:21 AM

## 2012-06-23 NOTE — Progress Notes (Signed)
Clinical Social Work Department CLINICAL SOCIAL WORK PLACEMENT NOTE 06/23/2012  Patient:  Sarah Moyer, Sarah Moyer  Account Number:  [id number] Admit date:  06/20/2012  Clinical Social Worker:  ,  Date/time:  06/23/2012 04:09 PM  Clinical Social Work is seeking post-discharge placement for this patient at the following level of care:   SKILLED NURSING   (*CSW will update this form in Epic as items are completed)   06/23/2012  Patient/family provided with [facility] Department of Clinical Social Work's list of facilities offering this level of care within the geographic area requested by the patient (or if unable, by the patient's family).  06/23/2012  Patient/family informed of their freedom to choose among providers that offer the needed level of care, that participate in Medicare, Medicaid or managed care program needed by the patient, have an available bed and are willing to accept the patient.  06/23/2012  Patient/family informed of [facility]' ownership interest in [facility], as well as of the fact that they are under no obligation to receive care at this facility.  PASARR submitted to EDS on 06/23/2012 PASARR number received from EDS on 06/23/2012  FL2 transmitted to all facilities in geographic area requested by pt/family on  06/23/2012 FL2 transmitted to all facilities within larger geographic area on   Patient informed that his/her managed care company has contracts with or will negotiate with  certain facilities, including the following:     Patient/family informed of bed offers received:  06/23/2012 Patient chooses bed at [facility] Physician recommends and patient chooses bed at    Patient to be transferred to [facility] on  06/23/2012 Patient to be transferred to facility by [facility]  The following physician request were entered in Epic:   Additional Comments:  Cori Razor LCSW [phone]

## 2012-06-23 NOTE — Evaluation (Signed)
Occupational Therapy Evaluation Patient Details Name: Sarah Moyer MRN: [id number] DOB: May 23, 1930 Today's Date: 06/23/2012 Time: 0931-1000 OT Time Calculation (min): 29 min  OT Assessment / Plan / Recommendation Clinical Impression  Pt is s/p L THR and displays decreased activity tolerance and independence with ADL. Will benefit from skilled OT services to improve ADL independence.     OT Assessment  Patient needs continued OT Services    Follow Up Recommendations  Skilled nursing facility    Barriers to Discharge      Equipment Recommendations  Defer to next venue    Recommendations for Other Services    Frequency  Min 2X/week    Precautions / Restrictions Precautions Precautions: Posterior Hip Precaution Comments: Pt able to state 2/3 hip precautions. reviewed all hip precautions. Restrictions Weight Bearing Restrictions: No LLE Weight Bearing: Weight bearing as tolerated        ADL  Eating/Feeding: Simulated;Independent Where Assessed - Eating/Feeding: Chair Grooming: Simulated;Wash/dry hands;Set up Where Assessed - Grooming: Supported sitting Upper Body Bathing: Simulated;Chest;Right arm;Left arm;Abdomen;Supervision/safety;Set up;Other (comment) (a little dizzy at [facility]) Where Assessed - Upper Body Bathing: Unsupported sitting Lower Body Bathing: Simulated;Moderate assistance;Other (comment) (without AE) Where Assessed - Lower Body Bathing: Supported sit to stand Upper Body Dressing: Simulated;Supervision/safety;Set up Where Assessed - Upper Body Dressing: Unsupported sitting Lower Body Dressing: Simulated;Maximal assistance;Other (comment) (without AE) Where Assessed - Lower Body Dressing: Supported sit to Pharmacist, hospital: Mining engineer Method: Sit to stand Toileting - Architect and Hygiene: Simulated;Minimal assistance Where Assessed - Engineer, mining and Hygiene: Standing Tub/Shower Transfer  Method: Not assessed Equipment Used: Rolling walker;Long-handled shoe horn;Long-handled sponge;Reacher;Sock aid ADL Comments: Pt and daughter education on all AE options and coverage. Pt practiced with reacher and sock aid.     OT Diagnosis: Generalized weakness  OT Problem List: Decreased strength;Decreased activity tolerance;Decreased knowledge of use of DME or AE;Decreased knowledge of precautions OT Treatment Interventions: Self-care/ADL training;Therapeutic activities;DME and/or AE instruction;Patient/family education   OT Goals Acute Rehab OT Goals OT Goal Formulation: With patient/family Time For Goal Achievement: 06/30/12 Potential to Achieve Goals: Good ADL Goals Pt Will Perform Grooming: with supervision;Standing at sink ADL Goal: Grooming - Progress: Goal set today Pt Will Perform Lower Body Bathing: Sit to stand from chair;Sit to stand from bed;with adaptive equipment;with min assist ADL Goal: Lower Body Bathing - Progress: Goal set today Pt Will Perform Lower Body Dressing: with min assist;Sit to stand from chair;Sit to stand from bed;with adaptive equipment ADL Goal: Lower Body Dressing - Progress: Goal set today Pt Will Transfer to Toilet: with supervision;Ambulation;with DME;3-in-1 ADL Goal: Toilet Transfer - Progress: Goal set today Pt Will Perform Toileting - Clothing Manipulation: with supervision;Standing ADL Goal: Toileting - Clothing Manipulation - Progress: Goal set today  Visit Information  Last OT Received On: 06/23/12 Assistance Needed: +1    Subjective Data  Subjective: I am pretty good Patient Stated Goal: to be independent   Prior Functioning  Vision/Perception  Home Living Lives With: Spouse Additional Comments: Pt uncertain about d/c.  Recommend ST-SNF prior to home. Prior Function Level of Independence: Independent Communication Communication: No difficulties      Cognition  Overall Cognitive Status: Appears within functional limits for  tasks assessed/performed Arousal/Alertness: Awake/alert Orientation Level: Appears intact for tasks assessed Behavior During Session: [facility] for tasks performed    Extremity/Trunk Assessment Right Upper Extremity Assessment RUE ROM/Strength/Tone: [facility] for tasks assessed Left Upper Extremity Assessment LUE ROM/Strength/Tone: [facility] for tasks assessed   Mobility  Shoulder Instructions  Transfers Transfers: Sit to Stand;Stand to Sit Sit to Stand: 4: Min assist;With upper extremity assist;From chair/3-in-1 Stand to Sit: 4: Min assist;With upper extremity assist;To chair/3-in-1 Details for Transfer Assistance: min verbal cues for L LE placement and hand placement.        Exercise     Balance Balance Balance Assessed: Yes Dynamic Standing Balance Dynamic Standing - Level of Assistance: 4: Min assist   End of Session OT - End of Session Activity Tolerance: Other (comment) (some dizziness) Patient left: in chair;with family/visitor present  GO     [name] [phone] 06/23/2012, 10:48 AM

## 2012-06-23 NOTE — Progress Notes (Signed)
Pt for d/c to SNF- [facility]. IV d/c'd. Dressing CDI to L hip. Dressing instructions emphasized to pt. & family. D/C instructions discussed with pt & family prior to d/c with verbalized undersatnding. No changes in am assessments for today. Awaiting for transport at this time.

## 2012-06-23 NOTE — Progress Notes (Signed)
CSW assisting with d/c planning. [facility] has ST SNF bed available for pt when ready for d/c. CSW will assist with d/c planning to SNF. Pt is agreeable with this plan.  Cori Razor LCSW [phone]

## 2012-06-24 ENCOUNTER — Encounter (HOSPITAL_COMMUNITY): Payer: Self-pay | Admitting: Orthopedic Surgery

## 2012-06-24 LAB — URINE CULTURE
Colony Count: NO GROWTH
Culture: NO GROWTH

## 2012-08-04 ENCOUNTER — Other Ambulatory Visit: Payer: Self-pay | Admitting: Internal Medicine

## 2012-08-04 DIAGNOSIS — Z1231 Encounter for screening mammogram for malignant neoplasm of breast: Secondary | ICD-10-CM

## 2012-09-13 ENCOUNTER — Ambulatory Visit: Payer: Medicare Other

## 2012-09-22 ENCOUNTER — Ambulatory Visit
Admission: RE | Admit: 2012-09-22 | Discharge: 2012-09-22 | Disposition: A | Discharge status: Home / Self Care | Payer: Self-pay | Source: Ambulatory Visit | Attending: Internal Medicine | Admitting: Internal Medicine

## 2012-09-22 DIAGNOSIS — Z1231 Encounter for screening mammogram for malignant neoplasm of breast: Secondary | ICD-10-CM

## 2013-08-16 ENCOUNTER — Other Ambulatory Visit: Payer: Self-pay

## 2013-08-16 DIAGNOSIS — Z1231 Encounter for screening mammogram for malignant neoplasm of breast: Secondary | ICD-10-CM

## 2013-10-04 ENCOUNTER — Ambulatory Visit
Admission: RE | Admit: 2013-10-04 | Discharge: 2013-10-04 | Disposition: A | Discharge status: Home / Self Care | Payer: Medicare Other | Source: Ambulatory Visit

## 2013-10-04 DIAGNOSIS — Z1231 Encounter for screening mammogram for malignant neoplasm of breast: Secondary | ICD-10-CM

## 2013-10-11 ENCOUNTER — Other Ambulatory Visit: Payer: Self-pay | Admitting: Internal Medicine

## 2013-10-11 DIAGNOSIS — R928 Other abnormal and inconclusive findings on diagnostic imaging of breast: Secondary | ICD-10-CM

## 2013-10-25 ENCOUNTER — Ambulatory Visit
Admission: RE | Admit: 2013-10-25 | Discharge: 2013-10-25 | Disposition: A | Discharge status: Home / Self Care | Payer: Medicare Other | Source: Ambulatory Visit | Attending: Internal Medicine | Admitting: Internal Medicine

## 2013-10-25 ENCOUNTER — Other Ambulatory Visit: Payer: Self-pay | Admitting: Internal Medicine

## 2013-10-25 DIAGNOSIS — R928 Other abnormal and inconclusive findings on diagnostic imaging of breast: Secondary | ICD-10-CM

## 2013-10-27 DIAGNOSIS — C801 Malignant (primary) neoplasm, unspecified: Secondary | ICD-10-CM

## 2013-10-27 HISTORY — DX: Malignant (primary) neoplasm, unspecified: C80.1

## 2013-11-07 ENCOUNTER — Ambulatory Visit
Admission: RE | Admit: 2013-11-07 | Discharge: 2013-11-07 | Disposition: A | Discharge status: Home / Self Care | Payer: Medicare HMO | Source: Ambulatory Visit | Attending: Internal Medicine | Admitting: Internal Medicine

## 2013-11-07 DIAGNOSIS — R928 Other abnormal and inconclusive findings on diagnostic imaging of breast: Secondary | ICD-10-CM

## 2013-11-08 ENCOUNTER — Other Ambulatory Visit: Payer: Self-pay | Admitting: Internal Medicine

## 2013-11-08 DIAGNOSIS — D0512 Intraductal carcinoma in situ of left breast: Secondary | ICD-10-CM

## 2013-11-08 DIAGNOSIS — R921 Mammographic calcification found on diagnostic imaging of breast: Secondary | ICD-10-CM

## 2013-11-09 ENCOUNTER — Telehealth: Payer: Self-pay | Admitting: *Deleted

## 2013-11-09 DIAGNOSIS — C50412 Malignant neoplasm of upper-outer quadrant of left female breast: Secondary | ICD-10-CM | POA: Insufficient documentation

## 2013-11-09 NOTE — Telephone Encounter (Signed)
Confirmed [facility] for 11/16/13 at 1200.  Instructions and contact information given.

## 2013-11-14 ENCOUNTER — Ambulatory Visit
Admission: RE | Admit: 2013-11-14 | Discharge: 2013-11-14 | Disposition: A | Discharge status: Home / Self Care | Payer: Commercial Managed Care - HMO | Source: Ambulatory Visit | Attending: Internal Medicine | Admitting: Internal Medicine

## 2013-11-14 DIAGNOSIS — D0512 Intraductal carcinoma in situ of left breast: Secondary | ICD-10-CM

## 2013-11-14 MED ORDER — GADOBENATE DIMEGLUMINE 529 MG/ML IV SOLN
13.0000 mL | Freq: Once | INTRAVENOUS | Status: AC | PRN
Start: 2013-11-14 — End: 2013-11-14
  Administered 2013-11-14: 13 mL via INTRAVENOUS

## 2013-11-16 ENCOUNTER — Telehealth: Payer: Self-pay | Admitting: Oncology

## 2013-11-16 ENCOUNTER — Encounter: Payer: Self-pay | Admitting: *Deleted

## 2013-11-16 ENCOUNTER — Encounter (INDEPENDENT_AMBULATORY_CARE_PROVIDER_SITE_OTHER): Payer: Self-pay

## 2013-11-16 ENCOUNTER — Ambulatory Visit (HOSPITAL_BASED_OUTPATIENT_CLINIC_OR_DEPARTMENT_OTHER): Payer: Commercial Managed Care - HMO

## 2013-11-16 ENCOUNTER — Encounter: Payer: Self-pay | Admitting: Oncology

## 2013-11-16 ENCOUNTER — Encounter (INDEPENDENT_AMBULATORY_CARE_PROVIDER_SITE_OTHER): Payer: Self-pay | Admitting: General Surgery

## 2013-11-16 ENCOUNTER — Ambulatory Visit (HOSPITAL_BASED_OUTPATIENT_CLINIC_OR_DEPARTMENT_OTHER): Payer: Medicare HMO | Admitting: General Surgery

## 2013-11-16 ENCOUNTER — Ambulatory Visit (HOSPITAL_BASED_OUTPATIENT_CLINIC_OR_DEPARTMENT_OTHER): Payer: Commercial Managed Care - HMO | Admitting: Oncology

## 2013-11-16 ENCOUNTER — Ambulatory Visit
Admission: RE | Admit: 2013-11-16 | Discharge: 2013-11-16 | Disposition: A | Discharge status: Home / Self Care | Payer: Medicare HMO | Source: Ambulatory Visit | Attending: Radiation Oncology | Admitting: Radiation Oncology

## 2013-11-16 ENCOUNTER — Other Ambulatory Visit (HOSPITAL_BASED_OUTPATIENT_CLINIC_OR_DEPARTMENT_OTHER): Payer: Medicare HMO

## 2013-11-16 ENCOUNTER — Ambulatory Visit: Payer: Medicare HMO | Admitting: Physical Therapy

## 2013-11-16 VITALS — BP 168/92 | HR 57 | Temp 98.3°F | Resp 20 | Ht 64.0 in | Wt 138.6 lb

## 2013-11-16 DIAGNOSIS — C50419 Malignant neoplasm of upper-outer quadrant of unspecified female breast: Secondary | ICD-10-CM

## 2013-11-16 DIAGNOSIS — C50412 Malignant neoplasm of upper-outer quadrant of left female breast: Secondary | ICD-10-CM

## 2013-11-16 DIAGNOSIS — D0590 Unspecified type of carcinoma in situ of unspecified breast: Secondary | ICD-10-CM

## 2013-11-16 LAB — CBC WITH DIFFERENTIAL/PLATELET
BASO%: 0.4 % (ref 0.0–2.0)
BASOS ABS: 0 10*3/uL (ref 0.0–0.1)
EOS ABS: 0.1 10*3/uL (ref 0.0–0.5)
EOS%: 1.9 % (ref 0.0–7.0)
HCT: 44.8 % (ref 34.8–46.6)
HEMOGLOBIN: 15.1 g/dL (ref 11.6–15.9)
LYMPH%: 29.9 % (ref 14.0–49.7)
MCH: 30.5 pg (ref 25.1–34.0)
MCHC: 33.7 g/dL (ref 31.5–36.0)
MCV: 90.5 fL (ref 79.5–101.0)
MONO#: 0.6 10*3/uL (ref 0.1–0.9)
MONO%: 9.2 % (ref 0.0–14.0)
NEUT%: 58.6 % (ref 38.4–76.8)
NEUTROS ABS: 4.1 10*3/uL (ref 1.5–6.5)
PLATELETS: 242 10*3/uL (ref 145–400)
RBC: 4.95 10*6/uL (ref 3.70–5.45)
RDW: 12.8 % (ref 11.2–14.5)
WBC: 7 10*3/uL (ref 3.9–10.3)
lymph#: 2.1 10*3/uL (ref 0.9–3.3)

## 2013-11-16 LAB — COMPREHENSIVE METABOLIC PANEL (CC13)
ALBUMIN: 3.8 g/dL (ref 3.5–5.0)
ALT: 10 U/L (ref 0–55)
AST: 14 U/L (ref 5–34)
Alkaline Phosphatase: 48 U/L (ref 40–150)
Anion Gap: 9 mEq/L (ref 3–11)
BILIRUBIN TOTAL: 0.9 mg/dL (ref 0.20–1.20)
BUN: 14.6 mg/dL (ref 7.0–26.0)
CO2: 28 mEq/L (ref 22–29)
CREATININE: 0.8 mg/dL (ref 0.6–1.1)
Calcium: 9.7 mg/dL (ref 8.4–10.4)
Chloride: 106 mEq/L (ref 98–109)
GLUCOSE: 94 mg/dL (ref 70–140)
Potassium: 4.5 mEq/L (ref 3.5–5.1)
Sodium: 143 mEq/L (ref 136–145)
Total Protein: 6.7 g/dL (ref 6.4–8.3)

## 2013-11-16 NOTE — Progress Notes (Signed)
[place] Work      Clinical Social Work met with Pt and her husband at her breast clinic appointment to assess psychosocial needs and concerns. Pt completed Distress Screen and rated her distress at a 3, stating her only concern was adjusting to her diagnosis. She appears to have adequate knowledge of her treatment plan and is eager to begin her journey. Pt appears to have adequate support at this time. CSW reviewed with Pt and family resources/programs at the [facility] to assist. CSW provided Pt written information and the [facility]. Pt and family aware to reach out to CSW as needed.   Clinical Social Work interventions: Pt education. Resource education.  Loren Racer, LCSW Clinical Social Worker [facility] for [place] Wednesday, Thursday and Friday Phone: [phone] Fax: [phone]

## 2013-11-16 NOTE — Progress Notes (Signed)
Patient ID: Sarah Moyer, female   DOB: October 23, 1930, 80 y.o.   MRN: [id number]  Chief Complaint  Patient presents with  . Other    HPI Sarah Moyer is a 80 y.o. female.  Referred by Dr Luberta Robertson HPI 9 yof who is otherwise healthy who presents after undergoing screening mm that shows a 1.4 cm left upper outer quadrant area of calcifications.  Biopsy was done that shows DCIS er positive at 100%, pr positive at 27%.  MR shows only postbiopsy change present.  She comes in today without any complaints referable to either breast to discuss options.  Past Medical History  Diagnosis Date  . Hiatal hernia   . Hyperlipidemia   . Hypertension   . DDD (degenerative disc disease)   . Diverticular disease     With colonic polyps  . Osteopenia     Past Surgical History  Procedure Laterality Date  . Appendectomy    . Hip arthroplasty  06/21/2012    Procedure: ARTHROPLASTY BIPOLAR HIP;  Surgeon: Mauri Pole, MD;  Location: [facility];  Service: Orthopedics;  Laterality: Left;    History reviewed. No pertinent family history.  Social History History  Substance Use Topics  . Smoking status: Never Smoker   . Smokeless tobacco: Not on file  . Alcohol Use: No    No Known Allergies  Current Outpatient Prescriptions  Medication Sig Dispense Refill  . calcium-vitamin D (OSCAL WITH D) 500-200 MG-UNIT per tablet Take 2 tablets by mouth daily.      . fish oil-omega-3 fatty acids 1000 MG capsule Take 1 capsule by mouth daily.        . simvastatin (ZOCOR) 20 MG tablet Take 20 mg by mouth daily.        No current facility-administered medications for this visit.    Review of Systems Review of Systems  Constitutional: Negative for fever, chills and unexpected weight change.  HENT: Negative for congestion, hearing loss, sore throat, trouble swallowing and voice change.   Eyes: Negative for visual disturbance.  Respiratory: Negative for cough and wheezing.   Cardiovascular: Negative  for chest pain, palpitations and leg swelling.  Gastrointestinal: Negative for nausea, vomiting, abdominal pain, diarrhea, constipation, blood in stool, abdominal distention and anal bleeding.  Genitourinary: Negative for hematuria, vaginal bleeding and difficulty urinating.  Musculoskeletal: Negative for arthralgias.  Skin: Negative for rash and wound.  Neurological: Negative for seizures, syncope and headaches.  Hematological: Negative for adenopathy. Does not bruise/bleed easily.  Psychiatric/Behavioral: Negative for confusion.    There were no vitals taken for this visit.  Physical Exam Physical Exam  Vitals reviewed. Constitutional: She appears well-developed and well-nourished.  Eyes: No scleral icterus.  Neck: Neck supple.  Cardiovascular: Normal rate, regular rhythm and normal heart sounds.   Pulmonary/Chest: Effort normal and breath sounds normal. She has no wheezes. She has no rales. Right breast exhibits no inverted nipple, no mass, no nipple discharge, no skin change and no tenderness. Left breast exhibits no inverted nipple, no mass, no nipple discharge, no skin change and no tenderness.  Lymphadenopathy:    She has no cervical adenopathy.    She has no axillary adenopathy.       Right: No supraclavicular adenopathy present.       Left: No supraclavicular adenopathy present.    Data Reviewed EXAM:  BILATERAL BREAST MRI WITH AND WITHOUT CONTRAST  LABS: BUN and creatinine were obtained on site at [place] at  [address]  Results: BUN 13 mg/dL, Creatinine 0.9 mg/dL.  TECHNIQUE:  Multiplanar, multisequence MR images of both breasts were obtained  prior to and following the intravenous administration of 83ml of  MultiHance  THREE-DIMENSIONAL MR IMAGE RENDERING ON INDEPENDENT WORKSTATION:  Three-dimensional MR images were rendered by post-processing of the  original MR data on an independent workstation. The  three-dimensional MR images were  interpreted, and findings are  reported in the following complete MRI report for this study. Three  dimensional images were evaluated at the independent DynaCad  workstation  COMPARISON: Previous exams  FINDINGS:  Breast composition: c: Heterogeneous fibroglandular tissue  Background parenchymal enhancement: Moderate  Right breast: There is a 5 x 4 x 3 mm circumscribed enhancing mass  located within the lower outer quadrant right breast within the  anterior 1/3 (superficial). This is most consistent with a small  intramammary lymph node. This is stable mammographically dating back  to examinations dated 07/28/2007 consistent with a benign finding.  There are no additional enhancing foci.  Left breast: There is post biopsy change associated with the left  breast stereotactic biopsy. There is no worrisome enhancement within  the left breast. The left breast DCIS is better sized by  mammography.  Lymph nodes: No abnormal appearing lymph nodes.  Ancillary findings: There is a large hiatal hernia present.  IMPRESSION:  1. Post biopsy change in the area of the recent left breast  stereotactic core biopsy. The extent of DCIS would be better  evaluated by the span of the calcifications.  2. 5 mm enhancing benign nodule located within the lower outer  quadrant of the right breast most consistent with a small  intramammary lymph node (stable since 2008).  3. Large hiatal hernia.    Assessment    Left breast dcis    Plan    Left breast wire guided lumpectomy      We discussed the staging and pathophysiology of breast cancer. We discussed all of the different options for treatment for breast cancer including surgery, chemotherapy, radiation therapy, Herceptin, and antiestrogen therapy.   We will not plan a sentinel node biopsy due to small area of DCIS We discussed the options for treatment of the breast cancer which included lumpectomy versus a mastectomy. We discussed the performance  of the lumpectomy with a wire placement. We discussed up to a 5% chance of a positive margin requiring reexcision in the operating room. We also discussed that she may need radiation therapy or antiestrogen therapy or both if she undergoes lumpectomy. We discussed mastectomy as well. We discussed that there is no difference in her survival whether she undergoes lumpectomy possibly with radiation therapy or antiestrogen therapy versus a mastectomy.  We discussed the risks of operation including bleeding, infection, possible reoperation. She understands her further therapy will be based on what her stages at the time of her operation.     Dawaun Brancato 11/16/2013, 2:17 PM

## 2013-11-16 NOTE — Telephone Encounter (Signed)
lmonvm for pt re appt for 2/16. schedule mailed.

## 2013-11-16 NOTE — Progress Notes (Signed)
Sarah Moyer [id number] Jun 11, 1930 80 y.o. 11/16/2013 3:38 PM  CC  Tivis Ringer, MD [place], [place]. [place] [address] Dr. Rolm Bookbinder Dr. Thea Silversmith  REASON FOR CONSULTATION:   80 year old femalewith left breast DCIS. Patient is seen in the multidisciplinary breast clinic for discussion of treatment options.   STAGE:   Breast cancer of upper-outer quadrant of left female breast   Primary site: Breast (Left)   Staging method: AJCC 7th Edition   Clinical: Stage 0 (Tis (DCIS), N0, cM0)   Summary: Stage 0 (Tis (DCIS), N0, cM0)  REFERRING PHYSICIAN: Dr. Rolm Bookbinder  HISTORY OF PRESENT ILLNESS:  Sarah Moyer is a 80 y.o. female.  Sarah Moyer a screening mammogram performed in December 2014. The mammogram showed in the upper-outer quadrant of the left breast calcifications measuring 1.4 cm. She had a biopsy performed that revealed intermediate to high-grade DCIS. Tumor was ER positive PR positive. MRI of the breasts performed after the biopsy only showed post biopsy changes otherwise negative. Clinically patient seems to be doing well. Her case was reviewed at the multidisciplinary breast conference. Her radiology and pathology were reviewed. Patient is now seen in the multidisciplinary breast clinic for discussion of treatment options. She was also seen by Dr. Rolm Bookbinder and Dr. Thea Silversmith. She is without any complaints today.   Past Medical History: Past Medical History  Diagnosis Date  . Hiatal hernia   . Hyperlipidemia   . Hypertension   . DDD (degenerative disc disease)   . Diverticular disease     With colonic polyps  . Osteopenia     Past Surgical History: Past Surgical History  Procedure Laterality Date  . Appendectomy    . Hip arthroplasty  06/21/2012    Procedure: ARTHROPLASTY BIPOLAR HIP;  Surgeon: Mauri Pole, MD;  Location: [facility];  Service: Orthopedics;  Laterality: Left;    Family  History: History reviewed. No pertinent family history.  Social History History  Substance Use Topics  . Smoking status: Never Smoker   . Smokeless tobacco: Not on file  . Alcohol Use: No    Allergies: No Known Allergies  Current Medications: Current Outpatient Prescriptions  Medication Sig Dispense Refill  . fish oil-omega-3 fatty acids 1000 MG capsule Take 1 capsule by mouth daily.        . simvastatin (ZOCOR) 20 MG tablet Take 20 mg by mouth daily.       . calcium-vitamin D (OSCAL WITH D) 500-200 MG-UNIT per tablet Take 2 tablets by mouth daily.       No current facility-administered medications for this visit.    OB/GYN History:menarche at age 80 she underwent menopause in 49. She did take hormone replacement therapy in the early 80s and discontinued them in the late 80s. She's had to live births first live birth was at age 42.  Fertility Discussion: n/a Prior History of Cancer: no  Health Maintenance:  Colonoscopy 10 years ago Bone Density 2013 Last PAP smear years ago  ECOG PERFORMANCE STATUS: 1 - Symptomatic but completely ambulatory  Genetic Counseling/testing: no  REVIEW OF SYSTEMS:  14 point review of systems was obtained and it is scant separately into the electronic medical record  PHYSICAL EXAMINATION: Blood pressure 168/92, pulse 57, temperature 98.3 F (36.8 C), temperature source Oral, resp. rate 20, height 5\' 4"  (1.626 m), weight 138 lb 9.6 oz (62.869 kg).  XBJ:YNWGN, no distress, comfortable and cooperative SKIN: skin color, texture, turgor are normal HEAD: Normocephalic  EYES: PERRLA, EOMI, Conjunctiva are pink and non-injected EARS: External ears normal OROPHARYNX:no exudate and no erythema  NECK: no adenopathy LYMPH:  no palpable lymphadenopathy, no hepatosplenomegaly BREAST:right breast normal without mass, skin or nipple changes or axillary nodes, left breast normal without mass, skin or nipple changes or axillary nodes LUNGS: clear to  auscultation and percussion HEART: regular rate & rhythm ABDOMEN:abdomen soft, non-tender, normal bowel sounds and no masses or organomegaly BACK: No CVA tenderness EXTREMITIES:no edema, no clubbing, no cyanosis  NEURO: alert & oriented x 3 with fluent speech, no focal motor/sensory deficits, gait normal     STUDIES/RESULTS: Mr Breast Bilateral W Wo Contrast  11/14/2013   CLINICAL DATA:  Recently diagnosed DCIS on low left breast stereotactic biopsy. Preoperative evaluation.  EXAM: BILATERAL BREAST MRI WITH AND WITHOUT CONTRAST  LABS:  BUN and creatinine were obtained on site at [facility] at  [address]  Results:  BUN 13 mg/dL,  Creatinine 0.9 mg/dL.  TECHNIQUE: Multiplanar, multisequence MR images of both breasts were obtained prior to and following the intravenous administration of 68ml of MultiHance  THREE-DIMENSIONAL MR IMAGE RENDERING ON INDEPENDENT WORKSTATION:  Three-dimensional MR images were rendered by post-processing of the original MR data on an independent workstation. The three-dimensional MR images were interpreted, and findings are reported in the following complete MRI report for this study. Three dimensional images were evaluated at the independent DynaCad workstation  COMPARISON:  Previous exams  FINDINGS: Breast composition: c:  Heterogeneous fibroglandular tissue  Background parenchymal enhancement: Moderate  Right breast: There is a 5 x 4 x 3 mm circumscribed enhancing mass located within the lower outer quadrant right breast within the anterior 1/3 (superficial). This is most consistent with a small intramammary lymph node. This is stable mammographically dating back to examinations dated 07/28/2007 consistent with a benign finding. There are no additional enhancing foci.  Left breast: There is post biopsy change associated with the left breast stereotactic biopsy. There is no worrisome enhancement within the left breast. The left breast DCIS is better sized by  mammography.  Lymph nodes: No abnormal appearing lymph nodes.  Ancillary findings:  There is a large hiatal hernia present.  IMPRESSION: 1. Post biopsy change in the area of the recent left breast stereotactic core biopsy. The extent of DCIS would be better evaluated by the span of the calcifications. 2. 5 mm enhancing benign nodule located within the lower outer quadrant of the right breast most consistent with a small intramammary lymph node (stable since 2008). 3. Large hiatal hernia.  RECOMMENDATION: Treatment plan.  BI-RADS CATEGORY  6: Known biopsy-proven malignancy - appropriate action should be taken.   Electronically Signed   By: Luberta Robertson M.D.   On: 11/14/2013 13:18   Mm Digital Diagnostic Unilat L  10/25/2013   CLINICAL DATA:  Recall from screening mammography.  EXAM: DIGITAL DIAGNOSTIC  left breast MAMMOGRAM  COMPARISON:  Previous examinations.  ACR Breast Density Category c: The breasts are heterogeneously dense, which may obscure small masses.  FINDINGS: There is a group of pleomorphic calcifications located within the lateral breast (left upper outer quadrant) which are suspicious for DCIS. The group of calcifications spans 1.4 cm. Tissue sampling is recommended. I have discussed stereotactic core biopsy with the patient. The patient is currently on aspirin and this will be discontinued and stereotactic biopsy performed in 1-2 weeks.  IMPRESSION: 1.4 cm group of slightly suspicious calcifications located laterally within the left breast (left upper outer quadrant). Tissue sampling  is recommended and stereotactic core biopsy has been scheduled for November 07, 2013 at 10 a.m. Marland Kitchen  RECOMMENDATION: Left breast stereotactic core biopsy.  I have discussed the findings and recommendations with the patient. Results were also provided in writing at the conclusion of the visit. If applicable, a reminder letter will be sent to the patient regarding the next appointment.  BI-RADS CATEGORY  4: Suspicious  abnormality - biopsy should be considered.   Electronically Signed   By: Luberta Robertson M.D.   On: 10/25/2013 10:58   Mm Lt Breast Bx W Loc Dev 1st Lesion Image Bx Spec Stereo Guide  11/08/2013   ADDENDUM REPORT: 11/08/2013 16:23  ADDENDUM: Pathology results: Results from the stereotactic left breast biopsy revealed high-grade DCIS. This is concordant with the imaging findings. The patient has been notified of the results. She is doing well and denies any biopsy site complications.  She has a Multidisciplinary Clinic appointment with Dr. Donne Hazel 11/16/2013 and a breast MRI has been scheduled for the patient on 11/14/2013 at 10:15 a.m.  The patient has been instructed to call the [facility] with any questions or concerns.   Electronically Signed   By: Everlean Alstrom M.D.   On: 11/08/2013 16:23   11/08/2013   CLINICAL DATA:  Suspicious calcifications in the left breast. Biopsy was recommended.  EXAM: LEFT STEREOTACTIC CORE NEEDLE BIOPSY  COMPARISON:  Previous exams.  FINDINGS: The patient and I discussed the procedure of stereotactic-guided biopsy including benefits and alternatives. We discussed the high likelihood of a successful procedure. We discussed the risks of the procedure including infection, bleeding, tissue injury, clip migration, and inadequate sampling. Informed written consent was given. The usual time out protocol was performed immediately prior to the procedure.  Using sterile technique and 2% Lidocaine as local anesthetic, under stereotactic guidance, a 9 gauge vacuum assisted device was used to perform core needle biopsy of calcifications in the upper outer left breast, posterior 3rd using a superior to inferior approach. Specimen radiograph was performed showing multiple calcifications within several cores. Specimens with calcifications are identified for pathology.  At the conclusion of the procedure, a dumbbell-shaped tissue marker clip was deployed into the biopsy cavity. Follow-up  2-view mammogram confirmed clip placement to be satisfactory.  IMPRESSION: Stereotactic-guided biopsy of the left breast. No apparent complications.  Electronically Signed: By: Curlene Dolphin M.D. On: 11/07/2013 12:33     LABS:    Chemistry      Component Value Date/Time   NA 143 11/16/2013 1209   NA 138 06/21/2012 0345   K 4.5 11/16/2013 1209   K 3.8 06/21/2012 0345   CL 107 06/21/2012 0345   CO2 28 11/16/2013 1209   CO2 26 06/21/2012 0345   BUN 14.6 11/16/2013 1209   BUN 10 06/21/2012 0345   CREATININE 0.8 11/16/2013 1209   CREATININE 0.88 06/21/2012 0345      Component Value Date/Time   CALCIUM 9.7 11/16/2013 1209   CALCIUM 8.7 06/21/2012 0345   ALKPHOS 48 11/16/2013 1209   ALKPHOS 66 06/20/2012 1234   AST 14 11/16/2013 1209   AST 18 06/20/2012 1234   ALT 10 11/16/2013 1209   ALT 14 06/20/2012 1234   BILITOT 0.90 11/16/2013 1209   BILITOT 0.7 06/20/2012 1234      Lab Results  Component Value Date   WBC 7.0 11/16/2013   HGB 15.1 11/16/2013   HCT 44.8 11/16/2013   MCV 90.5 11/16/2013   PLT 242 11/16/2013    ASSESSMENT/PLAN:  80 year old female with screening detected calcifications of the left breast. Biopsy revealing intermediate to high grade ductal carcinoma in situ which is ER positive PR positive. Patient today is seen in the multidisciplinary breast clinic to discuss her surgical and adjuvant treatment options.We spent the better part of today's hour-long appointment discussing the biology of breast cancer in general, and the specifics of the patient's tumor in particular.  Patient is a good candidate for breast conservation with lumpectomy. We do not think that she needs a sentinel lymph node biopsy since this seems to be pure DCIS. We discussed role of adjuvant antiestrogen therapy today in the setting of noninvasive DCIS breast cancer. We discussed different types of antiestrogen therapy including aromatase inhibitors versus tamoxifen. We discussed the side effects of both drugs.  Patient was also seen by Dr. Thea Silversmith for for discussion of post lumpectomy radiation therapy.  I will see the patient.after her surgery.  Clinical Trial Eligibility: no Multidisciplinary conference discussion yes  Discussion: Patient is being treated per NCCN breast cancer care guidelines appropriate for stage.0   Thank you so much for allowing me to participate in the care of Sarah Moyer. I will continue to follow up the patient with you and assist in her care.  All questions were answered. The patient knows to call the clinic with any problems, questions or concerns. We can certainly see the patient much sooner if necessary.  I spent 40 minutes counseling the patient face to face. The total time spent in the appointment was 55 minutes.  Marcy Panning, MD Medical/Oncology [facility] [phone] (beeper) [phone] (Office)  11/16/2013, 3:38 PM

## 2013-11-16 NOTE — Progress Notes (Signed)
Radiation Oncology         [phone]) [phone] ________________________________  Initial outpatient Consultation - Date: 11/16/2013   Name: DJENEBA BARSCH MRN: [id number]   DOB: December 02, 1929  REFERRING PHYSICIAN: Rolm Bookbinder, MD  DIAGNOSIS: DCIS of the left breast  HISTORY OF PRESENT ILLNESS::Careen Chauncey Cruel Sample is a 80 y.o. female  who had a screening mammogram. Calcifications were noted in the upper outer quadrant. These measured 1.4 cm. They were associated with intermediate to high-grade DCIS which was ER/PR positive. MRI was otherwise negative. She had no pain or palpable masses. She is accompanied by her husband and children today. She is [place] P2 with her first live birth at 30. She had her last menses at the age of 63. She took hormone replacement therapy in the 1980s but has quit for about 30 years.Marland Kitchen  PREVIOUS RADIATION THERAPY: No  PAST MEDICAL HISTORY:  has a past medical history of Hiatal hernia; Hyperlipidemia; Hypertension; DDD (degenerative disc disease); Diverticular disease; and Osteopenia.    PAST SURGICAL HISTORY: Past Surgical History  Procedure Laterality Date  . Appendectomy    . Hip arthroplasty  06/21/2012    Procedure: ARTHROPLASTY BIPOLAR HIP;  Surgeon: Mauri Pole, MD;  Location: [facility];  Service: Orthopedics;  Laterality: Left;    FAMILY HISTORY: No family history on file.  SOCIAL HISTORY:  History  Substance Use Topics  . Smoking status: Never Smoker   . Smokeless tobacco: Not on file  . Alcohol Use: No    ALLERGIES: Review of patient's allergies indicates no known allergies.  MEDICATIONS:  Current Outpatient Prescriptions  Medication Sig Dispense Refill  . calcium-vitamin D (OSCAL WITH D) 500-200 MG-UNIT per tablet Take 2 tablets by mouth daily.      . fish oil-omega-3 fatty acids 1000 MG capsule Take 1 capsule by mouth daily.        . simvastatin (ZOCOR) 20 MG tablet Take 20 mg by mouth daily.        No current facility-administered medications  for this encounter.    REVIEW OF SYSTEMS:  A 15 point review of systems is documented in the electronic medical record. This was obtained by the nursing staff. However, I reviewed this with the patient to discuss relevant findings and make appropriate changes.  Pertinent items are noted in HPI.  PHYSICAL EXAM: There were no vitals filed for this visit.. . He is a pleasant female in no distress sitting comfortably on examining table. She has no lymphedema. She is 5 out of 5 strength bilaterally. She has ptotic breasts bilaterally. There is some slight bruising in the upper outer quadrant of the left breast.  LABORATORY DATA:  Lab Results  Component Value Date   WBC 7.0 11/16/2013   HGB 15.1 11/16/2013   HCT 44.8 11/16/2013   MCV 90.5 11/16/2013   PLT 242 11/16/2013   Lab Results  Component Value Date   NA 143 11/16/2013   K 4.5 11/16/2013   CL 107 06/21/2012   CO2 28 11/16/2013   Lab Results  Component Value Date   ALT 10 11/16/2013   AST 14 11/16/2013   ALKPHOS 48 11/16/2013   BILITOT 0.90 11/16/2013     RADIOGRAPHY: Mr Breast Bilateral W Wo Contrast  11/14/2013   CLINICAL DATA:  Recently diagnosed DCIS on low left breast stereotactic biopsy. Preoperative evaluation.  EXAM: BILATERAL BREAST MRI WITH AND WITHOUT CONTRAST  LABS:  BUN and creatinine were obtained on site at [facility] at  [address]  Results:  BUN 13 mg/dL,  Creatinine 0.9 mg/dL.  TECHNIQUE: Multiplanar, multisequence MR images of both breasts were obtained prior to and following the intravenous administration of 65m of MultiHance  THREE-DIMENSIONAL MR IMAGE RENDERING ON INDEPENDENT WORKSTATION:  Three-dimensional MR images were rendered by post-processing of the original MR data on an independent workstation. The three-dimensional MR images were interpreted, and findings are reported in the following complete MRI report for this study. Three dimensional images were evaluated at the independent DynaCad workstation   COMPARISON:  Previous exams  FINDINGS: Breast composition: c:  Heterogeneous fibroglandular tissue  Background parenchymal enhancement: Moderate  Right breast: There is a 5 x 4 x 3 mm circumscribed enhancing mass located within the lower outer quadrant right breast within the anterior 1/3 (superficial). This is most consistent with a small intramammary lymph node. This is stable mammographically dating back to examinations dated 07/28/2007 consistent with a benign finding. There are no additional enhancing foci.  Left breast: There is post biopsy change associated with the left breast stereotactic biopsy. There is no worrisome enhancement within the left breast. The left breast DCIS is better sized by mammography.  Lymph nodes: No abnormal appearing lymph nodes.  Ancillary findings:  There is a large hiatal hernia present.  IMPRESSION: 1. Post biopsy change in the area of the recent left breast stereotactic core biopsy. The extent of DCIS would be better evaluated by the span of the calcifications. 2. 5 mm enhancing benign nodule located within the lower outer quadrant of the right breast most consistent with a small intramammary lymph node (stable since 2008). 3. Large hiatal hernia.  RECOMMENDATION: Treatment plan.  BI-RADS CATEGORY  6: Known biopsy-proven malignancy - appropriate action should be taken.   Electronically Signed   By: RLuberta RobertsonM.D.   On: 11/14/2013 13:18   Mm Digital Diagnostic Unilat L  10/25/2013   CLINICAL DATA:  Recall from screening mammography.  EXAM: DIGITAL DIAGNOSTIC  left breast MAMMOGRAM  COMPARISON:  Previous examinations.  ACR Breast Density Category c: The breasts are heterogeneously dense, which may obscure small masses.  FINDINGS: There is a group of pleomorphic calcifications located within the lateral breast (left upper outer quadrant) which are suspicious for DCIS. The group of calcifications spans 1.4 cm. Tissue sampling is recommended. I have discussed stereotactic  core biopsy with the patient. The patient is currently on aspirin and this will be discontinued and stereotactic biopsy performed in 1-2 weeks.  IMPRESSION: 1.4 cm group of slightly suspicious calcifications located laterally within the left breast (left upper outer quadrant). Tissue sampling is recommended and stereotactic core biopsy has been scheduled for November 07, 2013 at 10 a.m. .Marland Kitchen RECOMMENDATION: Left breast stereotactic core biopsy.  I have discussed the findings and recommendations with the patient. Results were also provided in writing at the conclusion of the visit. If applicable, a reminder letter will be sent to the patient regarding the next appointment.  BI-RADS CATEGORY  4: Suspicious abnormality - biopsy should be considered.   Electronically Signed   By: RLuberta RobertsonM.D.   On: 10/25/2013 10:58   Mm Lt Breast Bx W Loc Dev 1st Lesion Image Bx Spec Stereo Guide  11/08/2013   ADDENDUM REPORT: 11/08/2013 16:23  ADDENDUM: Pathology results: Results from the stereotactic left breast biopsy revealed high-grade DCIS. This is concordant with the imaging findings. The patient has been notified of the results. She is doing well and denies any biopsy site complications.  She  has a Multidisciplinary Clinic appointment with Dr. Donne Hazel 11/16/2013 and a breast MRI has been scheduled for the patient on 11/14/2013 at 10:15 a.m.  The patient has been instructed to call the [facility] with any questions or concerns.   Electronically Signed   By: Everlean Alstrom M.D.   On: 11/08/2013 16:23   11/08/2013   CLINICAL DATA:  Suspicious calcifications in the left breast. Biopsy was recommended.  EXAM: LEFT STEREOTACTIC CORE NEEDLE BIOPSY  COMPARISON:  Previous exams.  FINDINGS: The patient and I discussed the procedure of stereotactic-guided biopsy including benefits and alternatives. We discussed the high likelihood of a successful procedure. We discussed the risks of the procedure including infection,  bleeding, tissue injury, clip migration, and inadequate sampling. Informed written consent was given. The usual time out protocol was performed immediately prior to the procedure.  Using sterile technique and 2% Lidocaine as local anesthetic, under stereotactic guidance, a 9 gauge vacuum assisted device was used to perform core needle biopsy of calcifications in the upper outer left breast, posterior 3rd using a superior to inferior approach. Specimen radiograph was performed showing multiple calcifications within several cores. Specimens with calcifications are identified for pathology.  At the conclusion of the procedure, a dumbbell-shaped tissue marker clip was deployed into the biopsy cavity. Follow-up 2-view mammogram confirmed clip placement to be satisfactory.  IMPRESSION: Stereotactic-guided biopsy of the left breast. No apparent complications.  Electronically Signed: By: Curlene Dolphin M.D. On: 11/07/2013 12:33      IMPRESSION: DCIS of the left breast  PLAN: I spoke with Ms. Ileene Hutchinson regarding her diagnosis and options for treatment. We discussed lumpectomy plus radiation plus antiestrogen therapy. We discussed the process of simulation the placement tattoos. We discussed treatment here in [place] or at [place] [facility] which is closer to their home. We discussed skin redness and fatigue as possible side effects. I discussed hypofractionated treatment in 16 days with her. We discussed that she could have surgery alone with adequate margins and likely be at low risk for local recurrence during her lifetime. We discussed the low rate of symptomatic heart lung and rib damage. We discussed the low rate of secondary malignancies. I will plan on seeing her back after she speaks to Dr. Donne Hazel and Dr. Humphrey Rolls. She also met with our physical therapist as well as her breast cancer navigator and a member of our patient family support team.  I spent 40 minutes  face to face with the patient and more than 50% of that time  was spent in counseling and/or coordination of care.   ------------------------------------------------  Thea Silversmith, MD

## 2013-11-16 NOTE — Progress Notes (Signed)
Checked in new pt with no financial concerns. °

## 2013-11-16 NOTE — Progress Notes (Signed)
Pt signed a ROI and I took it to Med [facility] to scan.  Pt signed [facility] Hipp[facility] and I faxed it to [facility] at [facility].

## 2013-11-18 ENCOUNTER — Encounter (HOSPITAL_COMMUNITY): Payer: Self-pay | Admitting: Pharmacy Technician

## 2013-11-18 ENCOUNTER — Telehealth: Payer: Self-pay | Admitting: *Deleted

## 2013-11-18 NOTE — Telephone Encounter (Signed)
Spoke to pt concerning [facility] from 11/16/13.  Pt denies questions or concerns regarding dx or treatment care plan.  Confirmed surgery date and future appt with Dr. Humphrey Rolls.  Encourage pt to call with needs.  Received verbal understanding.  Contact information given.

## 2013-11-21 ENCOUNTER — Telehealth: Payer: Self-pay | Admitting: *Deleted

## 2013-11-21 NOTE — Telephone Encounter (Signed)
Faxed Care Plan to [facility] at [facility] & to the PCP.  Took Care Plan to [facility] to scan.  

## 2013-11-22 NOTE — Pre-Procedure Instructions (Signed)
Sarah Moyer  11/22/2013   Your procedure is scheduled on:  Nov 25 2013   Report to [facility]  2 * 3 at  Call [phone] for time to arrive call at 0700 11-25-13 (day of surgery)  Call this number if you have problems the morning of surgery: [phone]   Remember:   Do not eat food or drink liquids after midnight.   Take these medicines the morning of surgery with A SIP OF WATER: Na  Stop taking Aspirin, Aleve, Ibuprofen, BC's Goody's Herbal medications and fish oil   Do not wear jewelry, make-up or nail polish.  Do not wear lotions, powders, or perfumes. You may wear deodorant.  Do not shave 48 hours prior to surgery. Men may shave face and neck.  Do not bring valuables to the hospital.  [facility] is not responsible                  for any belongings or valuables.               Contacts, dentures or bridgework may not be worn into surgery.  Leave suitcase in the car. After surgery it may be brought to your room.  For patients admitted to the hospital, discharge time is determined by your                treatment team.               Patients discharged the day of surgery will not be allowed to drive  home.    Special Instructions: Shower using CHG 2 nights before surgery and the night before surgery.  If you shower the day of surgery use CHG.  Use special wash - you have one bottle of CHG for all showers.  You should use approximately 1/3 of the bottle for each shower.   Please read over the following fact sheets that you were given: Pain Booklet, Coughing and Deep Breathing and Surgical Site Infection Prevention

## 2013-11-23 ENCOUNTER — Encounter (HOSPITAL_COMMUNITY): Payer: Self-pay

## 2013-11-23 ENCOUNTER — Other Ambulatory Visit (HOSPITAL_COMMUNITY): Payer: Medicare HMO

## 2013-11-23 ENCOUNTER — Encounter (HOSPITAL_COMMUNITY)
Admission: RE | Admit: 2013-11-23 | Discharge: 2013-11-23 | Disposition: A | Discharge status: Home / Self Care | Payer: Medicare HMO | Source: Ambulatory Visit | Attending: General Surgery | Admitting: General Surgery

## 2013-11-23 LAB — BASIC METABOLIC PANEL
BUN: 12 mg/dL (ref 6–23)
CALCIUM: 9 mg/dL (ref 8.4–10.5)
CO2: 26 mEq/L (ref 19–32)
CREATININE: 0.79 mg/dL (ref 0.50–1.10)
Chloride: 106 mEq/L (ref 96–112)
GFR, EST AFRICAN AMERICAN: 88 mL/min — AB (ref >90)
GFR, EST NON AFRICAN AMERICAN: 76 mL/min — AB (ref >90)
Glucose, Bld: 93 mg/dL (ref 70–99)
Potassium: 4.7 mEq/L (ref 3.7–5.3)
Sodium: 144 mEq/L (ref 137–147)

## 2013-11-23 LAB — CBC
HCT: 44.4 % (ref 36.0–46.0)
Hemoglobin: 15.6 g/dL — ABNORMAL HIGH (ref 12.0–15.0)
MCH: 31.7 pg (ref 26.0–34.0)
MCHC: 35.1 g/dL (ref 30.0–36.0)
MCV: 90.2 fL (ref 78.0–100.0)
Platelets: 260 10*3/uL (ref 150–400)
RBC: 4.92 MIL/uL (ref 3.87–5.11)
RDW: 12.7 % (ref 11.5–15.5)
WBC: 7.3 10*3/uL (ref 4.0–10.5)

## 2013-11-23 NOTE — Progress Notes (Signed)
Dr Dagmar Hait called for past stress test

## 2013-11-24 MED ORDER — CEFAZOLIN SODIUM-DEXTROSE 2-3 GM-% IV SOLR
2.0000 g | INTRAVENOUS | Status: AC
  Administered 2013-11-25: 2 g via INTRAVENOUS
  Filled 2013-11-24: qty 50

## 2013-11-24 NOTE — Progress Notes (Signed)
Anesthesia Chart Review:  Patient is a 80 year old female scheduled for left breast wire guided lumpectomy for breast cancer on 11/25/13 by Dr. Donne Hazel.  Other history includes HTN, HLD, hiatal hernia, non-smoker, left THA '13. PCP is Dr. Dagmar Hait.   EKG on 06/20/12 showed NSR, first degree AVB, left BBB. EKG is > 32 year old, so plan to repeat on the day of surgery.  Notes indicate that she has had a left BBB since at least 08/2010.   She had a nuclear stress test at that time (09/24/10) that was normal, EF 70% (see Notes tab). No further cardiac work-up was recommended at that time.  Echo on 08/31/08 showed:  - Overall left ventricular systolic function was normal. There were no left ventricular regional wall motion abnormalities. - There was mild fibrocalcific change of the aortic root. - Trivial MR/TR.  Preoperative CXR and labs noted.  She has a chronic left BBB.  If no acute changes then I would anticipate that she could proceed as planned.  George Hugh [facility] Short Stay Center/Anesthesiology Phone [phone] 11/24/2013 11:19 AM

## 2013-11-25 ENCOUNTER — Ambulatory Visit (HOSPITAL_BASED_OUTPATIENT_CLINIC_OR_DEPARTMENT_OTHER)
Admission: RE | Admit: 2013-11-25 | Discharge: 2013-11-25 | Disposition: A | Discharge status: Home / Self Care | Payer: Medicare HMO | Source: Ambulatory Visit | Attending: General Surgery | Admitting: General Surgery

## 2013-11-25 ENCOUNTER — Ambulatory Visit (HOSPITAL_COMMUNITY): Payer: Medicare HMO | Admitting: Certified Registered"

## 2013-11-25 ENCOUNTER — Ambulatory Visit
Admission: RE | Admit: 2013-11-25 | Discharge: 2013-11-25 | Disposition: A | Discharge status: Home / Self Care | Payer: Commercial Managed Care - HMO | Source: Ambulatory Visit | Attending: General Surgery | Admitting: General Surgery

## 2013-11-25 ENCOUNTER — Encounter (HOSPITAL_COMMUNITY)
Admission: RE | Disposition: A | Discharge status: Home / Self Care | Payer: Self-pay | Source: Ambulatory Visit | Attending: General Surgery

## 2013-11-25 ENCOUNTER — Encounter (HOSPITAL_COMMUNITY): Payer: Self-pay | Admitting: *Deleted

## 2013-11-25 ENCOUNTER — Encounter (HOSPITAL_COMMUNITY): Payer: Medicare HMO | Admitting: Vascular Surgery

## 2013-11-25 DIAGNOSIS — D0590 Unspecified type of carcinoma in situ of unspecified breast: Secondary | ICD-10-CM

## 2013-11-25 DIAGNOSIS — M899 Disorder of bone, unspecified: Secondary | ICD-10-CM | POA: Insufficient documentation

## 2013-11-25 DIAGNOSIS — C50412 Malignant neoplasm of upper-outer quadrant of left female breast: Secondary | ICD-10-CM

## 2013-11-25 DIAGNOSIS — I10 Essential (primary) hypertension: Secondary | ICD-10-CM | POA: Insufficient documentation

## 2013-11-25 DIAGNOSIS — E785 Hyperlipidemia, unspecified: Secondary | ICD-10-CM | POA: Insufficient documentation

## 2013-11-25 DIAGNOSIS — Z01812 Encounter for preprocedural laboratory examination: Secondary | ICD-10-CM | POA: Insufficient documentation

## 2013-11-25 DIAGNOSIS — M949 Disorder of cartilage, unspecified: Secondary | ICD-10-CM

## 2013-11-25 DIAGNOSIS — Z01818 Encounter for other preprocedural examination: Secondary | ICD-10-CM | POA: Insufficient documentation

## 2013-11-25 HISTORY — PX: BREAST LUMPECTOMY WITH NEEDLE LOCALIZATION: SHX5759

## 2013-11-25 SURGERY — BREAST LUMPECTOMY WITH NEEDLE LOCALIZATION
Anesthesia: General | Site: Breast | Laterality: Left

## 2013-11-25 MED ORDER — BUPIVACAINE HCL (PF) 0.25 % IJ SOLN
INTRAMUSCULAR | Status: DC | PRN
Start: 2013-11-25 — End: 2013-11-25
  Administered 2013-11-25: 20 mL

## 2013-11-25 MED ORDER — EPHEDRINE SULFATE 50 MG/ML IJ SOLN
INTRAMUSCULAR | Status: AC
  Filled 2013-11-25: qty 1

## 2013-11-25 MED ORDER — FENTANYL CITRATE 0.05 MG/ML IJ SOLN: 25.0000 ug | INTRAMUSCULAR | Status: DC | PRN

## 2013-11-25 MED ORDER — OXYCODONE HCL 5 MG PO TABS: 5.0000 mg | ORAL_TABLET | Freq: Once | ORAL | Status: DC | PRN

## 2013-11-25 MED ORDER — ONDANSETRON HCL 4 MG/2ML IJ SOLN
INTRAMUSCULAR | Status: DC | PRN
  Administered 2013-11-25: 4 mg via INTRAVENOUS

## 2013-11-25 MED ORDER — ACETAMINOPHEN 325 MG PO TABS
ORAL_TABLET | ORAL | Status: AC
  Filled 2013-11-25: qty 2

## 2013-11-25 MED ORDER — LACTATED RINGERS IV SOLN
INTRAVENOUS | Status: DC | PRN
  Administered 2013-11-25: 12:00:00 via INTRAVENOUS

## 2013-11-25 MED ORDER — EPHEDRINE SULFATE 50 MG/ML IJ SOLN
INTRAMUSCULAR | Status: DC | PRN
  Administered 2013-11-25: 5 mg via INTRAVENOUS

## 2013-11-25 MED ORDER — 0.9 % SODIUM CHLORIDE (POUR BTL) OPTIME
TOPICAL | Status: DC | PRN
  Administered 2013-11-25: 1000 mL

## 2013-11-25 MED ORDER — FENTANYL CITRATE 0.05 MG/ML IJ SOLN
INTRAMUSCULAR | Status: DC | PRN
  Administered 2013-11-25 (×4): 25 ug via INTRAVENOUS

## 2013-11-25 MED ORDER — ONDANSETRON HCL 4 MG/2ML IJ SOLN: 4.0000 mg | Freq: Four times a day (QID) | INTRAMUSCULAR | Status: DC | PRN

## 2013-11-25 MED ORDER — DEXAMETHASONE SODIUM PHOSPHATE 4 MG/ML IJ SOLN
INTRAMUSCULAR | Status: DC | PRN
  Administered 2013-11-25: 8 mg via INTRAVENOUS

## 2013-11-25 MED ORDER — OXYCODONE-ACETAMINOPHEN 10-325 MG PO TABS: 1.0000 | ORAL_TABLET | Freq: Four times a day (QID) | ORAL | Status: DC | PRN

## 2013-11-25 MED ORDER — DEXAMETHASONE SODIUM PHOSPHATE 4 MG/ML IJ SOLN
INTRAMUSCULAR | Status: AC
  Filled 2013-11-25: qty 2

## 2013-11-25 MED ORDER — LIDOCAINE HCL (CARDIAC) 20 MG/ML IV SOLN
INTRAVENOUS | Status: AC
  Filled 2013-11-25: qty 5

## 2013-11-25 MED ORDER — ACETAMINOPHEN 325 MG PO TABS
650.0000 mg | ORAL_TABLET | ORAL | Status: DC | PRN
  Administered 2013-11-25: 650 mg via ORAL

## 2013-11-25 MED ORDER — SODIUM CHLORIDE 0.9 % IJ SOLN: 3.0000 mL | INTRAMUSCULAR | Status: DC | PRN

## 2013-11-25 MED ORDER — OXYCODONE HCL 5 MG/5ML PO SOLN: 5.0000 mg | Freq: Once | ORAL | Status: DC | PRN

## 2013-11-25 MED ORDER — SODIUM CHLORIDE 0.9 % IJ SOLN
INTRAMUSCULAR | Status: AC
  Filled 2013-11-25: qty 10

## 2013-11-25 MED ORDER — LIDOCAINE HCL (CARDIAC) 20 MG/ML IV SOLN
INTRAVENOUS | Status: DC | PRN
Start: 2013-11-25 — End: 2013-11-25
  Administered 2013-11-25: 70 mg via INTRAVENOUS

## 2013-11-25 MED ORDER — SODIUM CHLORIDE 0.9 % IJ SOLN: 3.0000 mL | Freq: Two times a day (BID) | INTRAMUSCULAR | Status: DC

## 2013-11-25 MED ORDER — LACTATED RINGERS IV SOLN
INTRAVENOUS | Status: DC
  Administered 2013-11-25: 11:00:00 via INTRAVENOUS

## 2013-11-25 MED ORDER — ONDANSETRON HCL 4 MG/2ML IJ SOLN
INTRAMUSCULAR | Status: AC
  Filled 2013-11-25: qty 2

## 2013-11-25 MED ORDER — MORPHINE SULFATE 2 MG/ML IJ SOLN: 2.0000 mg | INTRAMUSCULAR | Status: DC | PRN

## 2013-11-25 MED ORDER — PHENYLEPHRINE 40 MCG/ML (10ML) SYRINGE FOR IV PUSH (FOR BLOOD PRESSURE SUPPORT)
PREFILLED_SYRINGE | INTRAVENOUS | Status: AC
  Filled 2013-11-25: qty 10

## 2013-11-25 MED ORDER — SODIUM CHLORIDE 0.9 % IV SOLN: 250.0000 mL | INTRAVENOUS | Status: DC | PRN

## 2013-11-25 MED ORDER — ACETAMINOPHEN 650 MG RE SUPP: 650.0000 mg | RECTAL | Status: DC | PRN

## 2013-11-25 MED ORDER — PROPOFOL 10 MG/ML IV BOLUS
INTRAVENOUS | Status: AC
  Filled 2013-11-25: qty 20

## 2013-11-25 MED ORDER — ARTIFICIAL TEARS OP OINT
TOPICAL_OINTMENT | OPHTHALMIC | Status: AC
  Filled 2013-11-25: qty 3.5

## 2013-11-25 MED ORDER — FENTANYL CITRATE 0.05 MG/ML IJ SOLN
INTRAMUSCULAR | Status: AC
  Filled 2013-11-25: qty 5

## 2013-11-25 MED ORDER — BUPIVACAINE-EPINEPHRINE PF 0.25-1:200000 % IJ SOLN
INTRAMUSCULAR | Status: AC
  Filled 2013-11-25: qty 30

## 2013-11-25 MED ORDER — OXYCODONE HCL 5 MG PO TABS: 5.0000 mg | ORAL_TABLET | ORAL | Status: DC | PRN

## 2013-11-25 MED ORDER — SODIUM CHLORIDE 0.9 % IV SOLN: INTRAVENOUS | Status: DC

## 2013-11-25 MED ORDER — ROCURONIUM BROMIDE 50 MG/5ML IV SOLN
INTRAVENOUS | Status: AC
  Filled 2013-11-25: qty 1

## 2013-11-25 MED ORDER — ARTIFICIAL TEARS OP OINT
TOPICAL_OINTMENT | OPHTHALMIC | Status: DC | PRN
  Administered 2013-11-25: 1 via OPHTHALMIC

## 2013-11-25 MED ORDER — PROPOFOL 10 MG/ML IV BOLUS
INTRAVENOUS | Status: DC | PRN
  Administered 2013-11-25: 150 mg via INTRAVENOUS

## 2013-11-25 SURGICAL SUPPLY — 59 items
APPLIER CLIP 9.375 MED OPEN (MISCELLANEOUS) ×3
BENZOIN TINCTURE PRP APPL 2/3 (GAUZE/BANDAGES/DRESSINGS) IMPLANT
BINDER BREAST LRG (GAUZE/BANDAGES/DRESSINGS) IMPLANT
BINDER BREAST MEDIUM (GAUZE/BANDAGES/DRESSINGS) IMPLANT
BINDER BREAST XLRG (GAUZE/BANDAGES/DRESSINGS) IMPLANT
BINDER BREAST XXLRG (GAUZE/BANDAGES/DRESSINGS) IMPLANT
BLADE SURG 15 STRL LF DISP TIS (BLADE) ×1 IMPLANT
BLADE SURG 15 STRL SS (BLADE) ×2
CANISTER SUCT 1200ML W/VALVE (MISCELLANEOUS) IMPLANT
CHLORAPREP W/TINT 26ML (MISCELLANEOUS) ×3 IMPLANT
CLIP APPLIE 9.375 MED OPEN (MISCELLANEOUS) ×1 IMPLANT
CLOSURE STERI-STRIP 1/2X4 (GAUZE/BANDAGES/DRESSINGS) ×1
CLOSURE WOUND 1/2 X4 (GAUZE/BANDAGES/DRESSINGS)
CLSR STERI-STRIP ANTIMIC 1/2X4 (GAUZE/BANDAGES/DRESSINGS) ×2 IMPLANT
CONT SPEC 4OZ CLIKSEAL STRL BL (MISCELLANEOUS) ×6 IMPLANT
COVER MAYO STAND STRL (DRAPES) ×3 IMPLANT
COVER TABLE BACK 60X90 (DRAPES) ×3 IMPLANT
DECANTER SPIKE VIAL GLASS SM (MISCELLANEOUS) IMPLANT
DERMABOND ADHESIVE PROPEN (GAUZE/BANDAGES/DRESSINGS) ×2
DERMABOND ADVANCED (GAUZE/BANDAGES/DRESSINGS)
DERMABOND ADVANCED .7 DNX12 (GAUZE/BANDAGES/DRESSINGS) IMPLANT
DERMABOND ADVANCED .7 DNX6 (GAUZE/BANDAGES/DRESSINGS) ×1 IMPLANT
DEVICE DUBIN W/COMP PLATE 8390 (MISCELLANEOUS) ×3 IMPLANT
DRAPE PED LAPAROTOMY (DRAPES) ×3 IMPLANT
DRSG TEGADERM 4X4.75 (GAUZE/BANDAGES/DRESSINGS) ×3 IMPLANT
ELECT COATED BLADE 2.86 ST (ELECTRODE) ×3 IMPLANT
ELECT REM PT RETURN 9FT ADLT (ELECTROSURGICAL) ×3
ELECTRODE REM PT RTRN 9FT ADLT (ELECTROSURGICAL) ×1 IMPLANT
GLOVE BIO SURGEON STRL SZ7 (GLOVE) ×3 IMPLANT
GLOVE BIOGEL PI IND STRL 7.5 (GLOVE) ×1 IMPLANT
GLOVE BIOGEL PI IND STRL 8 (GLOVE) ×1 IMPLANT
GLOVE BIOGEL PI INDICATOR 7.5 (GLOVE) ×2
GLOVE BIOGEL PI INDICATOR 8 (GLOVE) ×2
GOWN STRL REUS W/ TWL LRG LVL3 (GOWN DISPOSABLE) ×3 IMPLANT
GOWN STRL REUS W/TWL LRG LVL3 (GOWN DISPOSABLE) ×6
KIT MARKER MARGIN INK (KITS) ×3 IMPLANT
NEEDLE HYPO 25X1 1.5 SAFETY (NEEDLE) ×3 IMPLANT
NS IRRIG 1000ML POUR BTL (IV SOLUTION) IMPLANT
PACK BASIN DAY SURGERY FS (CUSTOM PROCEDURE TRAY) ×3 IMPLANT
PENCIL BUTTON HOLSTER BLD 10FT (ELECTRODE) ×3 IMPLANT
SLEEVE SCD COMPRESS KNEE MED (MISCELLANEOUS) ×3 IMPLANT
SPONGE GAUZE 4X4 12PLY STER LF (GAUZE/BANDAGES/DRESSINGS) ×3 IMPLANT
SPONGE LAP 4X18 X RAY DECT (DISPOSABLE) ×3 IMPLANT
STRIP CLOSURE SKIN 1/2X4 (GAUZE/BANDAGES/DRESSINGS) IMPLANT
SUT MNCRL AB 4-0 PS2 18 (SUTURE) ×3 IMPLANT
SUT MON AB 5-0 PS2 18 (SUTURE) IMPLANT
SUT SILK 2 0 SH (SUTURE) ×3 IMPLANT
SUT VIC AB 2-0 SH 27 (SUTURE) ×2
SUT VIC AB 2-0 SH 27XBRD (SUTURE) ×1 IMPLANT
SUT VIC AB 3-0 SH 27 (SUTURE) ×2
SUT VIC AB 3-0 SH 27X BRD (SUTURE) ×1 IMPLANT
SUT VIC AB 5-0 PS2 18 (SUTURE) IMPLANT
SUT VICRYL AB 3 0 TIES (SUTURE) IMPLANT
SYR CONTROL 10ML LL (SYRINGE) ×3 IMPLANT
TOWEL OR 17X24 6PK STRL BLUE (TOWEL DISPOSABLE) ×3 IMPLANT
TOWEL OR NON WOVEN STRL DISP B (DISPOSABLE) ×3 IMPLANT
TUBE CONNECTING 20'X1/4 (TUBING) ×1
TUBE CONNECTING 20X1/4 (TUBING) ×2 IMPLANT
YANKAUER SUCT BULB TIP NO VENT (SUCTIONS) IMPLANT

## 2013-11-25 NOTE — Interval H&P Note (Signed)
History and Physical Interval Note:  11/25/2013 11:59 AM  Sarah Moyer  has presented today for surgery, with the diagnosis of left breast cancer  The various methods of treatment have been discussed with the patient and family. After consideration of risks, benefits and other options for treatment, the patient has consented to  Procedure(s): LEFT BREAST WIRE GUIDED LUMPECTOMY (Left) as a surgical intervention .  The patient's history has been reviewed, patient examined, no change in status, stable for surgery.  I have reviewed the patient's chart and labs.  Questions were answered to the patient's satisfaction.     Lauralynn Loeb

## 2013-11-25 NOTE — Anesthesia Preprocedure Evaluation (Addendum)
Anesthesia Evaluation  Patient identified by MRN, date of birth, ID band Patient awake    Reviewed: Allergy & Precautions, H&P , NPO status , Patient's Chart, lab work & pertinent test results  Airway Mallampati: II TM Distance: >3 FB Neck ROM: full    Dental  (+) Teeth Intact and Dental Advisory Given   Pulmonary shortness of breath,          Cardiovascular hypertension,     Neuro/Psych  Neuromuscular disease    GI/Hepatic hiatal hernia,   Endo/Other    Renal/GU      Musculoskeletal   Abdominal   Peds  Hematology   Anesthesia Other Findings   Reproductive/Obstetrics                          Anesthesia Physical Anesthesia Plan  ASA: II  Anesthesia Plan: General   Post-op Pain Management:    Induction: Intravenous  Airway Management Planned: LMA  Additional Equipment:   Intra-op Plan:   Post-operative Plan:   Informed Consent: I have reviewed the patients History and Physical, chart, labs and discussed the procedure including the risks, benefits and alternatives for the proposed anesthesia with the patient or authorized representative who has indicated his/her understanding and acceptance.     Plan Discussed with: CRNA, Anesthesiologist and Surgeon  Anesthesia Plan Comments:         Anesthesia Quick Evaluation

## 2013-11-25 NOTE — Discharge Instructions (Signed)
[facility] °Office Phone Number [phone] ° °BREAST BIOPSY/ PARTIAL MASTECTOMY: POST OP INSTRUCTIONS ° °Always review your discharge instruction sheet given to you by the facility where your surgery was performed. ° °IF YOU HAVE DISABILITY OR FAMILY LEAVE FORMS, YOU MUST BRING THEM TO THE OFFICE FOR PROCESSING.  DO NOT GIVE THEM TO YOUR DOCTOR. ° °1. A prescription for pain medication may be given to you upon discharge.  Take your pain medication as prescribed, if needed.  If narcotic pain medicine is not needed, then you may take acetaminophen (Tylenol), naprosyn (Alleve) or ibuprofen (Advil) as needed. °2. Take your usually prescribed medications unless otherwise directed °3. If you need a refill on your pain medication, please contact your pharmacy.  They will contact our office to request authorization.  Prescriptions will not be filled after 5pm or on week-ends. °4. You should eat very light the first 24 hours after surgery, such as soup, crackers, pudding, etc.  Resume your normal diet the day after surgery. °5. Most patients will experience some swelling and bruising in the breast.  Ice packs and a good support bra will help.  Wear the breast binder provided or a sports bra for 72 hours day and night.  After that wear a sports bra during the day until you return to the office. Swelling and bruising can take several days to resolve.  °6. It is common to experience some constipation if taking pain medication after surgery.  Increasing fluid intake and taking a stool softener will usually help or prevent this problem from occurring.  A mild laxative (Milk of Magnesia or Miralax) should be taken according to package directions if there are no bowel movements after 48 hours. °7. Unless discharge instructions indicate otherwise, you may remove your bandages 48 hours after surgery and you may shower at that time.  You may have steri-strips (small skin tapes) in place directly over the incision.   These strips should be left on the skin for 7-10 days and will come off on their own.  If your surgeon used skin glue on the incision, you may shower in 24 hours.  The glue will flake off over the next 2-3 weeks.  Any sutures or staples will be removed at the office during your follow-up visit. °8. ACTIVITIES:  You may resume regular daily activities (gradually increasing) beginning the next day.  Wearing a good support bra or sports bra minimizes pain and swelling.  You may have sexual intercourse when it is comfortable. °a. You may drive when you no longer are taking prescription pain medication, you can comfortably wear a seatbelt, and you can safely maneuver your car and apply brakes. °b. RETURN TO WORK:  ______________________________________________________________________________________ °9. You should see your doctor in the office for a follow-up appointment approximately two weeks after your surgery.  Your doctor’s nurse will typically make your follow-up appointment when she calls you with your pathology report.  Expect your pathology report 3-4 business days after your surgery.  You may call to check if you do not hear from us after three days. °10. OTHER INSTRUCTIONS: _______________________________________________________________________________________________ _____________________________________________________________________________________________________________________________________ °_____________________________________________________________________________________________________________________________________ °_____________________________________________________________________________________________________________________________________ ° °WHEN TO CALL DR WAKEFIELD: °1. Fever over 101.0 °2. Nausea and/or vomiting. °3. Extreme swelling or bruising. °4. Continued bleeding from incision. °5. Increased pain, redness, or drainage from the incision. ° °The clinic staff is available to  answer your questions during regular business hours.  Please don’t hesitate to call and ask to speak to one of the nurses for   clinical concerns.  If you have a medical emergency, go to the nearest emergency room or call 911.  A surgeon from [facility] is always on call at the hospital.  For further questions, please visit [email or web address]   What to eat:  For your first meals, you should eat lightly; only small meals initially.  If you do not have nausea, you may eat larger meals.  Avoid spicy, greasy and heavy food.    General Anesthesia, Adult, Care After  Refer to this sheet in the next few weeks. These instructions provide you with information on caring for yourself after your procedure. Your health care provider may also give you more specific instructions. Your treatment has been planned according to current medical practices, but problems sometimes occur. Call your health care provider if you have any problems or questions after your procedure.  WHAT TO EXPECT AFTER THE PROCEDURE  After the procedure, it is typical to experience:  Sleepiness.  Nausea and vomiting. HOME CARE INSTRUCTIONS  For the first 24 hours after general anesthesia:  Have a responsible person with you.  Do not drive a car. If you are alone, do not take public transportation.  Do not drink alcohol.  Do not take medicine that has not been prescribed by your health care provider.  Do not sign important papers or make important decisions.  You may resume a normal diet and activities as directed by your health care provider.  Change bandages (dressings) as directed.  If you have questions or problems that seem related to general anesthesia, call the hospital and ask for the anesthetist or anesthesiologist on call. SEEK MEDICAL CARE IF:  You have nausea and vomiting that continue the day after anesthesia.  You develop a rash. SEEK IMMEDIATE MEDICAL CARE IF:  You have difficulty breathing.    You have chest pain.  You have any allergic problems. Document Released: 01/19/2001 Document Revised: 06/15/2013 Document Reviewed: 04/28/2013  [facility] Patient Information 2014 [place], [place].

## 2013-11-25 NOTE — Anesthesia Postprocedure Evaluation (Signed)
Anesthesia Post Note  Patient: Sarah Moyer  Procedure(s) Performed: Procedure(s) (LRB): LEFT BREAST WIRE GUIDED LUMPECTOMY (Left)  Anesthesia type: General  Patient location: PACU  Post pain: Pain level controlled and Adequate analgesia  Post assessment: Post-op Vital signs reviewed, Patient's Cardiovascular Status Stable, Respiratory Function Stable, Patent Airway and Pain level controlled  Last Vitals:  Filed Vitals:   11/25/13 1327  BP: 142/78  Pulse: 86  Temp: 36.6 C  Resp:     Post vital signs: Reviewed and stable  Level of consciousness: awake, alert  and oriented  Complications: No apparent anesthesia complications

## 2013-11-25 NOTE — Transfer of Care (Signed)
Immediate Anesthesia Transfer of Care Note  Patient: Sarah Moyer  Procedure(s) Performed: Procedure(s): LEFT BREAST WIRE GUIDED LUMPECTOMY (Left)  Patient Location: PACU  Anesthesia Type:General  Level of Consciousness: awake, alert  and oriented  Airway & Oxygen Therapy: Patient Spontanous Breathing and Patient connected to nasal cannula oxygen  Post-op Assessment: Report given to PACU RN, Post -op Vital signs reviewed and stable and Patient moving all extremities X 4  Post vital signs: Reviewed and stable  Complications: No apparent anesthesia complications

## 2013-11-25 NOTE — Op Note (Signed)
Preoperative diagnosis: Left breast dcis, clinical stage 0 Postoperative diagnosis: same as above Procedure: Left breast wire guided partial mastectomy Surgeon: Dr Serita Grammes Anesthesia: general EBL: minimal Specimen: 1. Left breast marked with paint 2. Inferior margin marked short superior, long lateral, double deep 3. Lateral margin marked as above Dispo: to pacu stable Sponge and needle count correct at completion Drains: None Complications: None  Indications: This is an 80 year old female who underwent a screening mammogram with finding of left breast microcalcifications. These underwent a biopsy that showed ductal carcinoma in situ. She was seen in the multidisciplinary clinic and elected to undergo lumpectomy.  Procedure: After informed consent was obtained the patient was first taken at the breast center. She had a wire placed and I had these mammograms in the operating room. She was then brought to the operating room. She was given cefazolin. Sequential compression devices were on her legs. She was placed under general anesthesia without complication. Her left breast was prepped and draped in the standard sterile surgical fashion. Surgical timeout was performed.  I infiltrated Marcaine throughout the upper portion of the left breast. I made a curvilinear incision and used cautery to excise the wire and the surrounding tissue in an attempt to get clear margins. I then did a mammogram which confirmed removal of the clip, calcifications, and wire. I did remove an additional inferior and lateral margin to ensure that my margins are clear. I then marked the cavity with clips. I then closes with 2-0 Vicryl, 3-0 Vicryl, 4-0 Monocryl, and Dermabond and Steri-Strips. I infiltrated additional 10 cc of Marcaine throughout this period breast binder was placed. She tolerated as well as expected and transferred to recovery stable.

## 2013-11-25 NOTE — H&P (View-Only) (Signed)
Patient ID: Sarah Moyer, female   DOB: October 23, 1930, 80 y.o.   MRN: [id number]  Chief Complaint  Patient presents with  . Other    HPI Sarah Moyer is a 80 y.o. female.  Referred by Dr Luberta Robertson HPI 9 yof who is otherwise healthy who presents after undergoing screening mm that shows a 1.4 cm left upper outer quadrant area of calcifications.  Biopsy was done that shows DCIS er positive at 100%, pr positive at 27%.  MR shows only postbiopsy change present.  She comes in today without any complaints referable to either breast to discuss options.  Past Medical History  Diagnosis Date  . Hiatal hernia   . Hyperlipidemia   . Hypertension   . DDD (degenerative disc disease)   . Diverticular disease     With colonic polyps  . Osteopenia     Past Surgical History  Procedure Laterality Date  . Appendectomy    . Hip arthroplasty  06/21/2012    Procedure: ARTHROPLASTY BIPOLAR HIP;  Surgeon: Mauri Pole, MD;  Location: [facility];  Service: Orthopedics;  Laterality: Left;    History reviewed. No pertinent family history.  Social History History  Substance Use Topics  . Smoking status: Never Smoker   . Smokeless tobacco: Not on file  . Alcohol Use: No    No Known Allergies  Current Outpatient Prescriptions  Medication Sig Dispense Refill  . calcium-vitamin D (OSCAL WITH D) 500-200 MG-UNIT per tablet Take 2 tablets by mouth daily.      . fish oil-omega-3 fatty acids 1000 MG capsule Take 1 capsule by mouth daily.        . simvastatin (ZOCOR) 20 MG tablet Take 20 mg by mouth daily.        No current facility-administered medications for this visit.    Review of Systems Review of Systems  Constitutional: Negative for fever, chills and unexpected weight change.  HENT: Negative for congestion, hearing loss, sore throat, trouble swallowing and voice change.   Eyes: Negative for visual disturbance.  Respiratory: Negative for cough and wheezing.   Cardiovascular: Negative  for chest pain, palpitations and leg swelling.  Gastrointestinal: Negative for nausea, vomiting, abdominal pain, diarrhea, constipation, blood in stool, abdominal distention and anal bleeding.  Genitourinary: Negative for hematuria, vaginal bleeding and difficulty urinating.  Musculoskeletal: Negative for arthralgias.  Skin: Negative for rash and wound.  Neurological: Negative for seizures, syncope and headaches.  Hematological: Negative for adenopathy. Does not bruise/bleed easily.  Psychiatric/Behavioral: Negative for confusion.    There were no vitals taken for this visit.  Physical Exam Physical Exam  Vitals reviewed. Constitutional: She appears well-developed and well-nourished.  Eyes: No scleral icterus.  Neck: Neck supple.  Cardiovascular: Normal rate, regular rhythm and normal heart sounds.   Pulmonary/Chest: Effort normal and breath sounds normal. She has no wheezes. She has no rales. Right breast exhibits no inverted nipple, no mass, no nipple discharge, no skin change and no tenderness. Left breast exhibits no inverted nipple, no mass, no nipple discharge, no skin change and no tenderness.  Lymphadenopathy:    She has no cervical adenopathy.    She has no axillary adenopathy.       Right: No supraclavicular adenopathy present.       Left: No supraclavicular adenopathy present.    Data Reviewed EXAM:  BILATERAL BREAST MRI WITH AND WITHOUT CONTRAST  LABS: BUN and creatinine were obtained on site at [place] at  [address]  Results: BUN 13 mg/dL, Creatinine 0.9 mg/dL.  TECHNIQUE:  Multiplanar, multisequence MR images of both breasts were obtained  prior to and following the intravenous administration of 13ml of  MultiHance  THREE-DIMENSIONAL MR IMAGE RENDERING ON INDEPENDENT WORKSTATION:  Three-dimensional MR images were rendered by post-processing of the  original MR data on an independent workstation. The  three-dimensional MR images were  interpreted, and findings are  reported in the following complete MRI report for this study. Three  dimensional images were evaluated at the independent DynaCad  workstation  COMPARISON: Previous exams  FINDINGS:  Breast composition: c: Heterogeneous fibroglandular tissue  Background parenchymal enhancement: Moderate  Right breast: There is a 5 x 4 x 3 mm circumscribed enhancing mass  located within the lower outer quadrant right breast within the  anterior 1/3 (superficial). This is most consistent with a small  intramammary lymph node. This is stable mammographically dating back  to examinations dated 07/28/2007 consistent with a benign finding.  There are no additional enhancing foci.  Left breast: There is post biopsy change associated with the left  breast stereotactic biopsy. There is no worrisome enhancement within  the left breast. The left breast DCIS is better sized by  mammography.  Lymph nodes: No abnormal appearing lymph nodes.  Ancillary findings: There is a large hiatal hernia present.  IMPRESSION:  1. Post biopsy change in the area of the recent left breast  stereotactic core biopsy. The extent of DCIS would be better  evaluated by the span of the calcifications.  2. 5 mm enhancing benign nodule located within the lower outer  quadrant of the right breast most consistent with a small  intramammary lymph node (stable since 2008).  3. Large hiatal hernia.    Assessment    Left breast dcis    Plan    Left breast wire guided lumpectomy      We discussed the staging and pathophysiology of breast cancer. We discussed all of the different options for treatment for breast cancer including surgery, chemotherapy, radiation therapy, Herceptin, and antiestrogen therapy.   We will not plan a sentinel node biopsy due to small area of DCIS We discussed the options for treatment of the breast cancer which included lumpectomy versus a mastectomy. We discussed the performance  of the lumpectomy with a wire placement. We discussed up to a 5% chance of a positive margin requiring reexcision in the operating room. We also discussed that she may need radiation therapy or antiestrogen therapy or both if she undergoes lumpectomy. We discussed mastectomy as well. We discussed that there is no difference in her survival whether she undergoes lumpectomy possibly with radiation therapy or antiestrogen therapy versus a mastectomy.  We discussed the risks of operation including bleeding, infection, possible reoperation. She understands her further therapy will be based on what her stages at the time of her operation.     Betsaida [place] 11/16/2013, 2:17 PM    

## 2013-11-28 ENCOUNTER — Encounter (HOSPITAL_COMMUNITY): Payer: Self-pay | Admitting: General Surgery

## 2013-12-12 ENCOUNTER — Encounter (HOSPITAL_BASED_OUTPATIENT_CLINIC_OR_DEPARTMENT_OTHER): Payer: Commercial Managed Care - HMO | Admitting: Oncology

## 2013-12-12 ENCOUNTER — Other Ambulatory Visit: Payer: Self-pay | Admitting: Emergency Medicine

## 2013-12-13 ENCOUNTER — Encounter (INDEPENDENT_AMBULATORY_CARE_PROVIDER_SITE_OTHER): Payer: Medicare HMO | Admitting: General Surgery

## 2013-12-13 NOTE — Progress Notes (Signed)
  This encounter was created in error - please disregard.  Patient left the office prior to being seen, Reasoning: bad weather and she did not want to be seen late.  Marcy Panning, MD Medical/Oncology [facility] [phone] (beeper) [phone] (Office)  12/13/2013, 8:07 AM  This encounter was created in error - please disregard.

## 2013-12-14 ENCOUNTER — Telehealth: Payer: Self-pay | Admitting: Oncology

## 2013-12-14 ENCOUNTER — Telehealth (INDEPENDENT_AMBULATORY_CARE_PROVIDER_SITE_OTHER): Payer: Self-pay

## 2013-12-14 NOTE — Telephone Encounter (Signed)
Patient is needing P/O appt she had sx 11-25-13 part. Lumpectomy, Please advise nothing until end of this month. Patient states she is doing well. I informed her I would have DR. Wakefield's assistant call her

## 2013-12-14 NOTE — Telephone Encounter (Signed)
I need to see this week or next week

## 2013-12-14 NOTE — Telephone Encounter (Signed)
I can see her on 2/27 at 12:30

## 2013-12-14 NOTE — Telephone Encounter (Signed)
, °

## 2013-12-15 NOTE — Telephone Encounter (Signed)
Called Sarah Moyer to make her an appt for next week. I made her appt with Dr Donne Hazel for 12/20/13. The Sarah Moyer understands.

## 2013-12-20 ENCOUNTER — Encounter (INDEPENDENT_AMBULATORY_CARE_PROVIDER_SITE_OTHER): Payer: Self-pay | Admitting: General Surgery

## 2013-12-20 ENCOUNTER — Ambulatory Visit (INDEPENDENT_AMBULATORY_CARE_PROVIDER_SITE_OTHER): Payer: Medicare HMO | Admitting: General Surgery

## 2013-12-20 VITALS — BP 136/80 | HR 72 | Temp 97.9°F | Resp 14 | Ht 64.0 in | Wt 141.4 lb

## 2013-12-20 DIAGNOSIS — C50912 Malignant neoplasm of unspecified site of left female breast: Secondary | ICD-10-CM

## 2013-12-20 DIAGNOSIS — Z09 Encounter for follow-up examination after completed treatment for conditions other than malignant neoplasm: Secondary | ICD-10-CM

## 2013-12-20 DIAGNOSIS — C50919 Malignant neoplasm of unspecified site of unspecified female breast: Secondary | ICD-10-CM

## 2013-12-20 NOTE — Addendum Note (Signed)
Addended by: Illene Regulus on: 12/20/2013 03:28 PM   Modules accepted: Orders

## 2013-12-20 NOTE — Progress Notes (Signed)
Subjective:     Patient ID: Sarah Moyer, female   DOB: 05/22/1930, 80 y.o.   MRN: [id number]  HPI 35 yof s/p left breast lumpectomy for 1.8 cm high grade er/pr positive dcis with negative margins.  She returns today doing well without complaint.  Review of Systems     Objective:   Physical Exam Healing left breast incision without infection    Assessment:     Stage 0 left breast dcis     Plan:     She is doing well and will be released to full activity.  I gave her exercises to begin. She will be referred to see Dr Everardo All for discussion of antiestrogen medication as this is closer to her home. I will see back in one year

## 2013-12-23 DIAGNOSIS — D0512 Intraductal carcinoma in situ of left breast: Secondary | ICD-10-CM | POA: Insufficient documentation

## 2014-01-27 ENCOUNTER — Telehealth: Payer: Self-pay | Admitting: *Deleted

## 2014-01-27 NOTE — Telephone Encounter (Signed)
Mailed after appt letter to pt. 

## 2014-02-14 ENCOUNTER — Encounter: Payer: Self-pay | Admitting: *Deleted

## 2014-02-15 ENCOUNTER — Encounter: Payer: Self-pay | Admitting: Cardiology

## 2014-02-15 ENCOUNTER — Ambulatory Visit (INDEPENDENT_AMBULATORY_CARE_PROVIDER_SITE_OTHER): Payer: Commercial Managed Care - HMO | Admitting: Cardiology

## 2014-02-15 VITALS — BP 162/96 | HR 70 | Ht 64.0 in | Wt 142.7 lb

## 2014-02-15 DIAGNOSIS — R42 Dizziness and giddiness: Secondary | ICD-10-CM | POA: Insufficient documentation

## 2014-02-15 DIAGNOSIS — R9431 Abnormal electrocardiogram [ECG] [EKG]: Secondary | ICD-10-CM

## 2014-02-15 DIAGNOSIS — R0602 Shortness of breath: Secondary | ICD-10-CM

## 2014-02-15 DIAGNOSIS — I10 Essential (primary) hypertension: Secondary | ICD-10-CM

## 2014-02-15 NOTE — Patient Instructions (Signed)
The current medical regimen is effective;  continue present plan and medications.  Follow up as needed 

## 2014-02-15 NOTE — Progress Notes (Signed)
HPI The patient presents for evaluation of an abnormal EKG. I last saw her in 2011.  She has had a left anterior fascicular block and later left bundle branch block. She had echocardiography in 2009 that was essentially unremarkable. Stress testing in 2011 for dyspnea was unrevealing with a well preserved ejection fraction. She has been treated for breast cancer with lumpectomy and radiation. Her radiation oncologist noticed her slow heart rate, apparently some premature ectopic beats in her left bundle branch block and wanted her to be referred for followup. She has had no cardiovascular symptoms by her report. She does get some very mild orthostasis which she's had for quite a while and she does have to deal with this by moving slowly when she stands up. She has not had any presyncope or syncope. She denies any chest pressure, neck or arm discomfort. She doesn't feel any palpitations. She has no shortness of breath, PND or orthopnea. She has had no weight gain or edema.  No Known Allergies  Current Outpatient Prescriptions  Medication Sig Dispense Refill  . aspirin EC 81 MG tablet Take 81 mg by mouth daily.      . Calcium Carbonate-Vitamin D (CALCIUM PLUS VITAMIN D PO) Take by mouth daily.      . simvastatin (ZOCOR) 20 MG tablet Take 20 mg by mouth daily.        No current facility-administered medications for this visit.    Past Medical History  Diagnosis Date  . Hiatal hernia   . Hyperlipidemia   . DDD (degenerative disc disease)   . Diverticular disease     With colonic polyps  . Osteopenia   . Hypertension      no meds    dr Radene Gunning  . Arrhythmia     left BBB,    PVC'S  . Cancer 10/2013    LEFT BREAST CANCER--LUMPECTOMY    Past Surgical History  Procedure Laterality Date  . Appendectomy    . Hip arthroplasty  06/21/2012    Procedure: ARTHROPLASTY BIPOLAR HIP;  Surgeon: Mauri Pole, MD;  Location: [facility];  Service: Orthopedics;  Laterality: Left;  . Breast lumpectomy with  needle localization Left 11/25/2013    Procedure: LEFT BREAST WIRE GUIDED LUMPECTOMY;  Surgeon: Rolm Bookbinder, MD;  Location: [place];  Service: General;  Laterality: Left;  . Breast surgery      No family history on file.  History   Social History  . Marital Status: Married    Spouse Name: N/A    Number of Children: N/A  . Years of Education: N/A   Occupational History  . Not on file.   Social History Main Topics  . Smoking status: Never Smoker   . Smokeless tobacco: Not on file  . Alcohol Use: No  . Drug Use: No  . Sexual Activity: Yes   Other Topics Concern  . Not on file   Social History Narrative  . No narrative on file    ROS:  Positive for constipation, neck pain.  Otherwise as stated in the HPI and negative for all other systems.  PHYSICAL EXAM BP 162/96  Pulse 70  Ht 5\' 4"  (1.626 m)  Wt 142 lb 11.2 oz (64.728 kg)  BMI 24.48 kg/m2 GENERAL:  Well appearing, for her age 87:  Pupils equal round and reactive, fundi not visualized, oral mucosa unremarkable NECK:  No jugular venous distention, waveform within normal limits, carotid upstroke brisk and symmetric, no bruits, no thyromegaly LYMPHATICS:  No cervical, inguinal adenopathy LUNGS:  Clear to auscultation bilaterally BACK:  No CVA tenderness CHEST:  Unremarkable HEART:  PMI not displaced or sustained,S1 and S2 within normal limits, no S3, no S4, no clicks, no rubs, no murmurs ABD:  Flat, positive bowel sounds normal in frequency in pitch, no bruits, no rebound, no guarding, no midline pulsatile mass, no hepatomegaly, no splenomegaly EXT:  2 plus pulses throughout, no edema, no cyanosis no clubbing, bilateral femoral bruits SKIN:  No rashes no nodules NEURO:  Cranial nerves II through XII grossly intact, motor grossly intact throughout PSYCH:  Cognitively intact, oriented to person place and time  EKG:  Sinus rhythm, rate 70, left bundle branch block, premature atrial contractions .  02/15/2014  ASSESSMENT AND PLAN  ABNORMAL EKG:  This has been stable left bundle branch block with no symptoms associated. No further cardiovascular testing is indicated.  DIZZINESS:  This is mild orthostasis and she practices appropriate avoidance measures.  HTN:  Her BP is elevated today. However, this is somewhat unusual. At this point I would not change her medicines but if she needs further management she certainly would need to avoid AV nodal blocking agents.

## 2014-04-06 ENCOUNTER — Telehealth: Payer: Self-pay | Admitting: *Deleted

## 2014-04-06 NOTE — Telephone Encounter (Signed)
Patient called and left voicemail that she is now going to [facility] since Feb 2015. Will not need appts here at [facility]. No return call needed.

## 2014-06-12 DIAGNOSIS — Z96642 Presence of left artificial hip joint: Secondary | ICD-10-CM | POA: Insufficient documentation

## 2014-06-12 DIAGNOSIS — M810 Age-related osteoporosis without current pathological fracture: Secondary | ICD-10-CM | POA: Insufficient documentation

## 2014-12-12 DIAGNOSIS — C50412 Malignant neoplasm of upper-outer quadrant of left female breast: Secondary | ICD-10-CM | POA: Diagnosis not present

## 2014-12-13 DIAGNOSIS — M5380 Other specified dorsopathies, site unspecified: Secondary | ICD-10-CM | POA: Diagnosis not present

## 2014-12-13 DIAGNOSIS — I10 Essential (primary) hypertension: Secondary | ICD-10-CM | POA: Diagnosis not present

## 2014-12-13 DIAGNOSIS — I739 Peripheral vascular disease, unspecified: Secondary | ICD-10-CM | POA: Diagnosis not present

## 2014-12-13 DIAGNOSIS — K449 Diaphragmatic hernia without obstruction or gangrene: Secondary | ICD-10-CM | POA: Diagnosis not present

## 2014-12-13 DIAGNOSIS — N390 Urinary tract infection, site not specified: Secondary | ICD-10-CM | POA: Diagnosis not present

## 2014-12-13 DIAGNOSIS — Q892 Congenital malformations of other endocrine glands: Secondary | ICD-10-CM | POA: Diagnosis not present

## 2014-12-13 DIAGNOSIS — Z0000 Encounter for general adult medical examination without abnormal findings: Secondary | ICD-10-CM | POA: Diagnosis not present

## 2014-12-13 DIAGNOSIS — M810 Age-related osteoporosis without current pathological fracture: Secondary | ICD-10-CM | POA: Diagnosis not present

## 2014-12-13 DIAGNOSIS — E785 Hyperlipidemia, unspecified: Secondary | ICD-10-CM | POA: Diagnosis not present

## 2014-12-13 DIAGNOSIS — J302 Other seasonal allergic rhinitis: Secondary | ICD-10-CM | POA: Diagnosis not present

## 2014-12-14 DIAGNOSIS — Q892 Congenital malformations of other endocrine glands: Secondary | ICD-10-CM | POA: Diagnosis not present

## 2014-12-14 DIAGNOSIS — M810 Age-related osteoporosis without current pathological fracture: Secondary | ICD-10-CM | POA: Diagnosis not present

## 2015-01-16 DIAGNOSIS — D0510 Intraductal carcinoma in situ of unspecified breast: Secondary | ICD-10-CM | POA: Diagnosis not present

## 2015-01-24 ENCOUNTER — Other Ambulatory Visit: Payer: Self-pay

## 2015-01-24 DIAGNOSIS — C50912 Malignant neoplasm of unspecified site of left female breast: Secondary | ICD-10-CM

## 2015-01-24 DIAGNOSIS — Z1239 Encounter for other screening for malignant neoplasm of breast: Secondary | ICD-10-CM

## 2015-01-29 ENCOUNTER — Other Ambulatory Visit: Payer: Self-pay | Admitting: *Deleted

## 2015-01-29 DIAGNOSIS — C50912 Malignant neoplasm of unspecified site of left female breast: Secondary | ICD-10-CM

## 2015-01-29 DIAGNOSIS — Z1239 Encounter for other screening for malignant neoplasm of breast: Secondary | ICD-10-CM

## 2015-01-31 ENCOUNTER — Ambulatory Visit
Admission: RE | Admit: 2015-01-31 | Discharge: 2015-01-31 | Disposition: A | Discharge status: Home / Self Care | Payer: Commercial Managed Care - HMO | Source: Ambulatory Visit | Attending: General Surgery | Admitting: General Surgery

## 2015-01-31 DIAGNOSIS — Z853 Personal history of malignant neoplasm of breast: Secondary | ICD-10-CM | POA: Diagnosis not present

## 2015-04-20 DIAGNOSIS — H9193 Unspecified hearing loss, bilateral: Secondary | ICD-10-CM | POA: Diagnosis not present

## 2015-04-20 DIAGNOSIS — I10 Essential (primary) hypertension: Secondary | ICD-10-CM | POA: Diagnosis not present

## 2015-04-20 DIAGNOSIS — H6123 Impacted cerumen, bilateral: Secondary | ICD-10-CM | POA: Diagnosis not present

## 2015-04-20 DIAGNOSIS — Z6824 Body mass index (BMI) 24.0-24.9, adult: Secondary | ICD-10-CM | POA: Diagnosis not present

## 2015-04-24 DIAGNOSIS — H903 Sensorineural hearing loss, bilateral: Secondary | ICD-10-CM | POA: Diagnosis not present

## 2015-05-16 DIAGNOSIS — Z961 Presence of intraocular lens: Secondary | ICD-10-CM | POA: Diagnosis not present

## 2015-05-16 DIAGNOSIS — H26493 Other secondary cataract, bilateral: Secondary | ICD-10-CM | POA: Diagnosis not present

## 2015-05-22 IMAGING — MG MM LT BREAST BX W LOC DEV 1ST LESION IMAGE BX SPEC STEREO GUIDE
2 series · 2 of 2 positions shown · non-contrast
Comparison: Previous exams.

ADDENDUM:
Pathology results: Results from the stereotactic left breast biopsy
revealed high-grade DCIS. This is concordant with the imaging
findings. The patient has been notified of the results. She is doing
well and denies any biopsy site complications.

She has [REDACTED] appointment with Dr. Armelim
11/16/2013 and a breast MRI has been scheduled for the patient on
The patient has been instructed to call the [REDACTED] with any
questions or concerns.
CLINICAL DATA: Suspicious calcifications in the left breast. Biopsy
was recommended.
EXAM:
LEFT STEREOTACTIC CORE NEEDLE BIOPSY

[L CC]
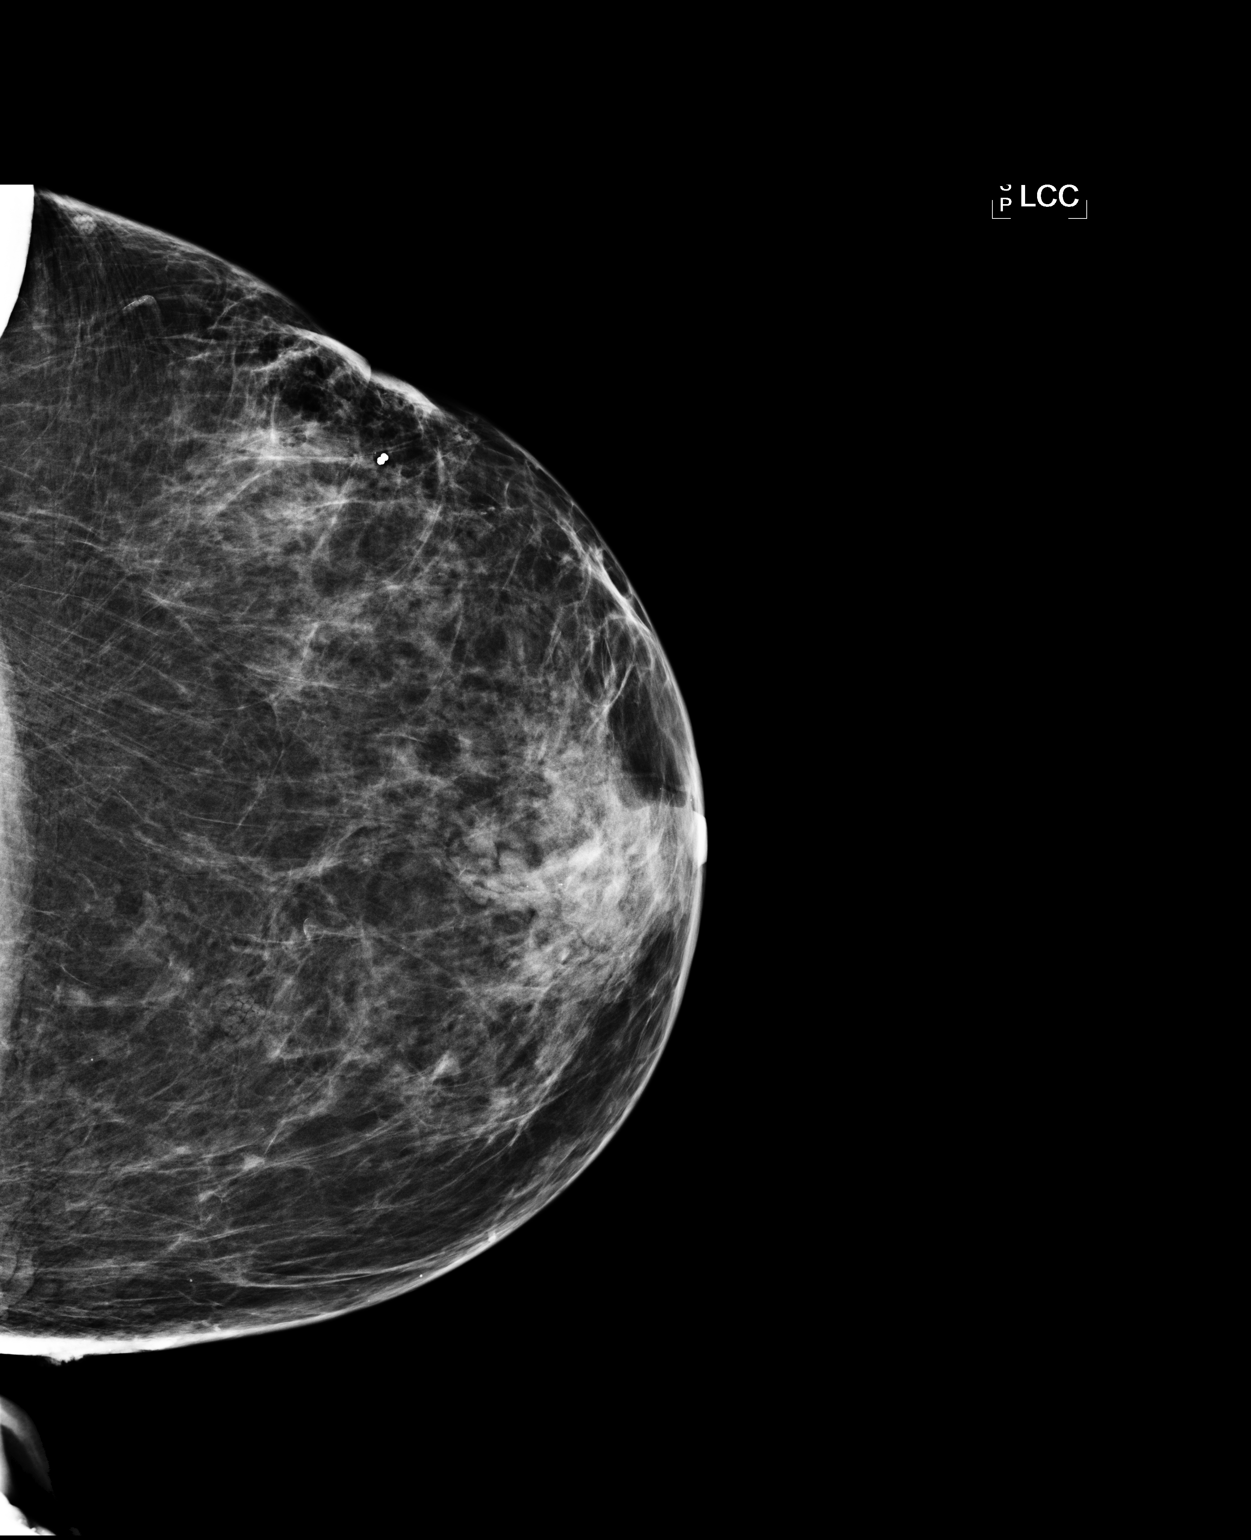

[L ML]
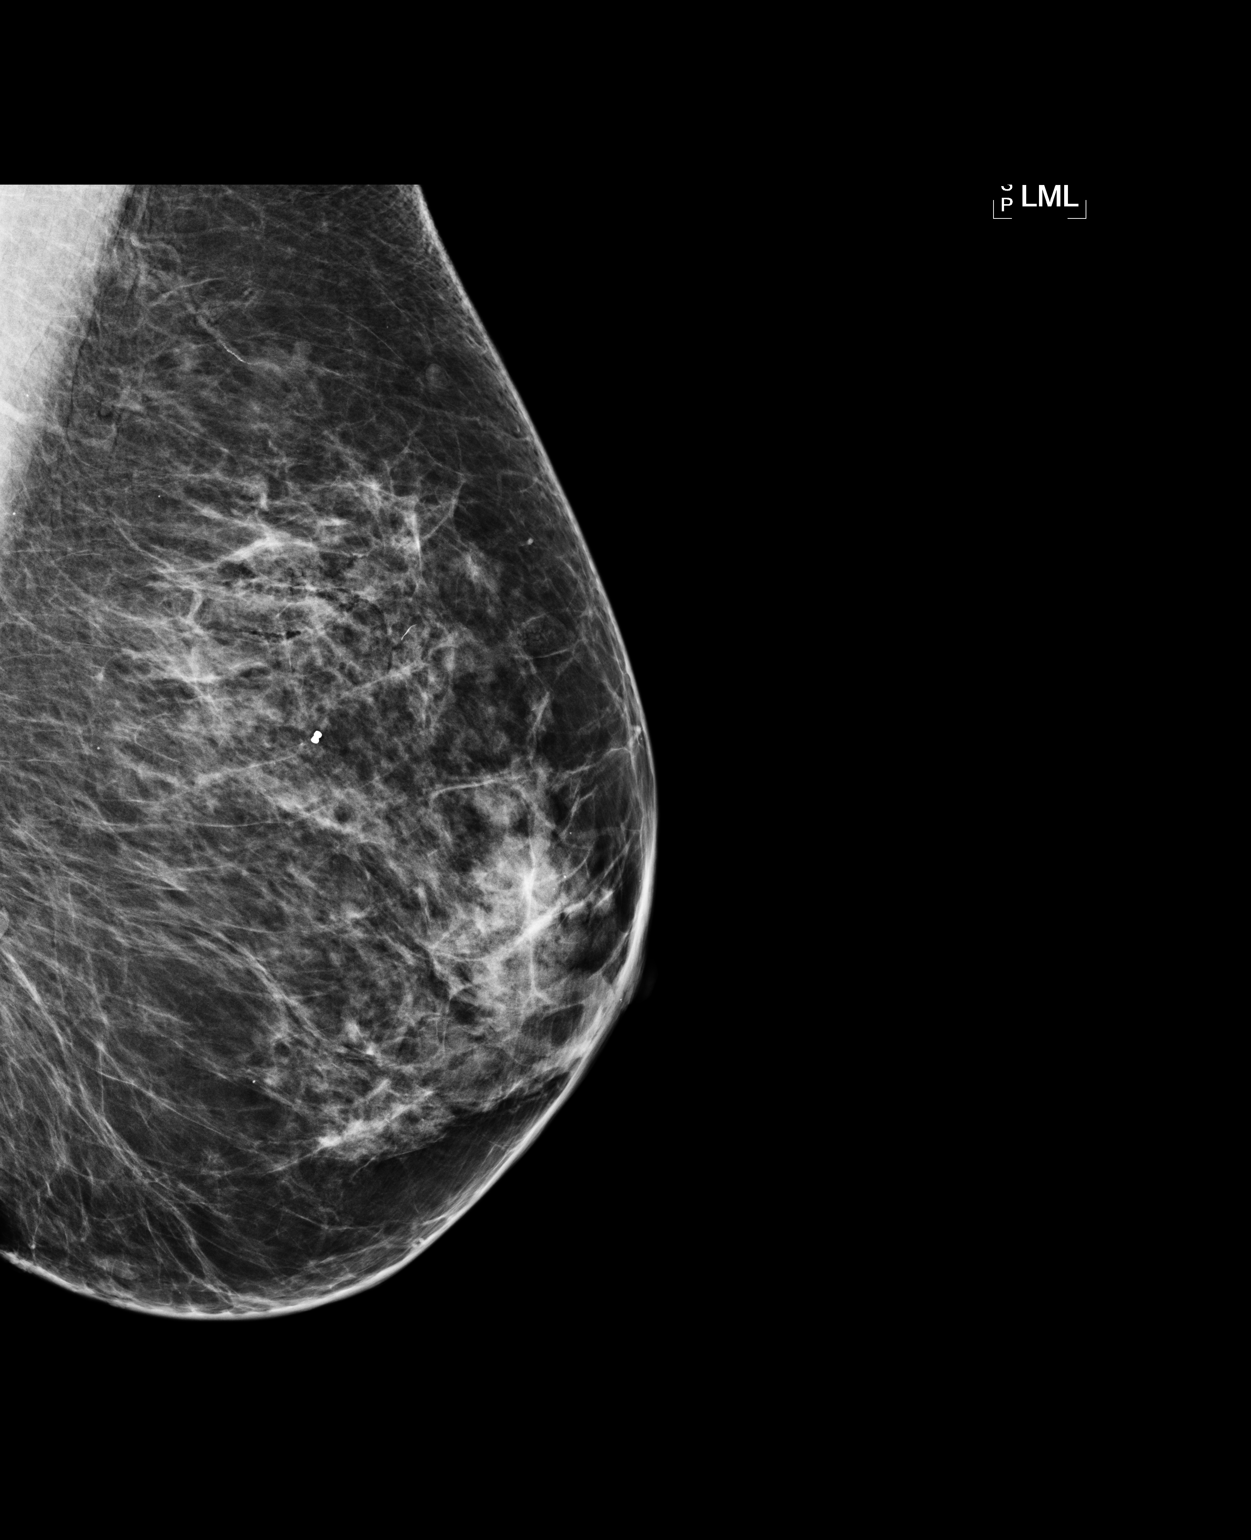

[2 of 2 positions shown; findings below may reference images not displayed]



Using sterile technique and 2% Lidocaine as local anesthetic, under
stereotactic guidance, a 9 gauge vacuum assisted device was used to
perform core needle biopsy of calcifications in the upper outer left
breast, posterior 3rd using a superior to inferior approach.
Specimen radiograph was performed showing multiple calcifications
within several cores. Specimens with calcifications are identified
for pathology.

At the conclusion of the procedure, a dumbbell-shaped tissue marker
clip was deployed into the biopsy cavity. Follow-up 2-view mammogram
confirmed clip placement to be satisfactory.
IMPRESSION: Stereotactic-guided biopsy of the left breast. No apparent
complications.

## 2015-06-12 DIAGNOSIS — C50412 Malignant neoplasm of upper-outer quadrant of left female breast: Secondary | ICD-10-CM | POA: Diagnosis not present

## 2015-06-26 DIAGNOSIS — D0512 Intraductal carcinoma in situ of left breast: Secondary | ICD-10-CM | POA: Diagnosis not present

## 2015-06-26 DIAGNOSIS — H9193 Unspecified hearing loss, bilateral: Secondary | ICD-10-CM | POA: Diagnosis not present

## 2015-06-26 DIAGNOSIS — E785 Hyperlipidemia, unspecified: Secondary | ICD-10-CM | POA: Diagnosis not present

## 2015-06-26 DIAGNOSIS — S72002A Fracture of unspecified part of neck of left femur, initial encounter for closed fracture: Secondary | ICD-10-CM | POA: Diagnosis not present

## 2015-06-26 DIAGNOSIS — M810 Age-related osteoporosis without current pathological fracture: Secondary | ICD-10-CM | POA: Diagnosis not present

## 2015-06-26 DIAGNOSIS — I10 Essential (primary) hypertension: Secondary | ICD-10-CM | POA: Diagnosis not present

## 2015-06-26 DIAGNOSIS — Z6824 Body mass index (BMI) 24.0-24.9, adult: Secondary | ICD-10-CM | POA: Diagnosis not present

## 2015-12-10 DIAGNOSIS — R1012 Left upper quadrant pain: Secondary | ICD-10-CM | POA: Insufficient documentation

## 2015-12-10 DIAGNOSIS — D0512 Intraductal carcinoma in situ of left breast: Secondary | ICD-10-CM | POA: Diagnosis not present

## 2015-12-19 ENCOUNTER — Other Ambulatory Visit: Payer: Self-pay | Admitting: Internal Medicine

## 2015-12-19 DIAGNOSIS — Z9889 Other specified postprocedural states: Secondary | ICD-10-CM

## 2015-12-19 DIAGNOSIS — Z853 Personal history of malignant neoplasm of breast: Secondary | ICD-10-CM

## 2015-12-31 DIAGNOSIS — I10 Essential (primary) hypertension: Secondary | ICD-10-CM | POA: Diagnosis not present

## 2015-12-31 DIAGNOSIS — R8299 Other abnormal findings in urine: Secondary | ICD-10-CM | POA: Diagnosis not present

## 2015-12-31 DIAGNOSIS — E784 Other hyperlipidemia: Secondary | ICD-10-CM | POA: Diagnosis not present

## 2015-12-31 DIAGNOSIS — N390 Urinary tract infection, site not specified: Secondary | ICD-10-CM | POA: Diagnosis not present

## 2015-12-31 DIAGNOSIS — M810 Age-related osteoporosis without current pathological fracture: Secondary | ICD-10-CM | POA: Diagnosis not present

## 2016-01-07 DIAGNOSIS — M810 Age-related osteoporosis without current pathological fracture: Secondary | ICD-10-CM | POA: Diagnosis not present

## 2016-01-07 DIAGNOSIS — R0989 Other specified symptoms and signs involving the circulatory and respiratory systems: Secondary | ICD-10-CM | POA: Diagnosis not present

## 2016-01-07 DIAGNOSIS — Z0000 Encounter for general adult medical examination without abnormal findings: Secondary | ICD-10-CM | POA: Diagnosis not present

## 2016-01-07 DIAGNOSIS — I10 Essential (primary) hypertension: Secondary | ICD-10-CM | POA: Diagnosis not present

## 2016-01-07 DIAGNOSIS — M5380 Other specified dorsopathies, site unspecified: Secondary | ICD-10-CM | POA: Diagnosis not present

## 2016-01-07 DIAGNOSIS — H9193 Unspecified hearing loss, bilateral: Secondary | ICD-10-CM | POA: Diagnosis not present

## 2016-01-07 DIAGNOSIS — K449 Diaphragmatic hernia without obstruction or gangrene: Secondary | ICD-10-CM | POA: Diagnosis not present

## 2016-01-07 DIAGNOSIS — Q892 Congenital malformations of other endocrine glands: Secondary | ICD-10-CM | POA: Diagnosis not present

## 2016-01-07 DIAGNOSIS — D0512 Intraductal carcinoma in situ of left breast: Secondary | ICD-10-CM | POA: Diagnosis not present

## 2016-01-24 DIAGNOSIS — L0889 Other specified local infections of the skin and subcutaneous tissue: Secondary | ICD-10-CM | POA: Diagnosis not present

## 2016-01-24 DIAGNOSIS — Z6824 Body mass index (BMI) 24.0-24.9, adult: Secondary | ICD-10-CM | POA: Diagnosis not present

## 2016-01-24 DIAGNOSIS — S80922A Unspecified superficial injury of left lower leg, initial encounter: Secondary | ICD-10-CM | POA: Diagnosis not present

## 2016-02-06 ENCOUNTER — Ambulatory Visit
Admission: RE | Admit: 2016-02-06 | Discharge: 2016-02-06 | Disposition: A | Discharge status: Home / Self Care | Payer: Commercial Managed Care - HMO | Source: Ambulatory Visit | Attending: Internal Medicine | Admitting: Internal Medicine

## 2016-02-06 DIAGNOSIS — Z853 Personal history of malignant neoplasm of breast: Secondary | ICD-10-CM

## 2016-02-06 DIAGNOSIS — R922 Inconclusive mammogram: Secondary | ICD-10-CM | POA: Diagnosis not present

## 2016-02-06 DIAGNOSIS — Z9889 Other specified postprocedural states: Secondary | ICD-10-CM

## 2016-04-01 DIAGNOSIS — M542 Cervicalgia: Secondary | ICD-10-CM | POA: Diagnosis not present

## 2016-04-01 DIAGNOSIS — Z6824 Body mass index (BMI) 24.0-24.9, adult: Secondary | ICD-10-CM | POA: Diagnosis not present

## 2016-04-17 ENCOUNTER — Ambulatory Visit (INDEPENDENT_AMBULATORY_CARE_PROVIDER_SITE_OTHER): Payer: Commercial Managed Care - HMO | Admitting: Otolaryngology

## 2016-04-17 DIAGNOSIS — H6123 Impacted cerumen, bilateral: Secondary | ICD-10-CM | POA: Diagnosis not present

## 2016-07-01 DIAGNOSIS — H01021 Squamous blepharitis right upper eyelid: Secondary | ICD-10-CM | POA: Diagnosis not present

## 2016-07-01 DIAGNOSIS — H01024 Squamous blepharitis left upper eyelid: Secondary | ICD-10-CM | POA: Diagnosis not present

## 2016-07-01 DIAGNOSIS — Z0100 Encounter for examination of eyes and vision without abnormal findings: Secondary | ICD-10-CM | POA: Diagnosis not present

## 2016-07-09 DIAGNOSIS — M5380 Other specified dorsopathies, site unspecified: Secondary | ICD-10-CM | POA: Diagnosis not present

## 2016-07-09 DIAGNOSIS — Z23 Encounter for immunization: Secondary | ICD-10-CM | POA: Diagnosis not present

## 2016-07-09 DIAGNOSIS — D0512 Intraductal carcinoma in situ of left breast: Secondary | ICD-10-CM | POA: Diagnosis not present

## 2016-07-09 DIAGNOSIS — R413 Other amnesia: Secondary | ICD-10-CM | POA: Diagnosis not present

## 2016-07-09 DIAGNOSIS — I10 Essential (primary) hypertension: Secondary | ICD-10-CM | POA: Diagnosis not present

## 2016-07-09 DIAGNOSIS — M810 Age-related osteoporosis without current pathological fracture: Secondary | ICD-10-CM | POA: Diagnosis not present

## 2016-07-09 DIAGNOSIS — K449 Diaphragmatic hernia without obstruction or gangrene: Secondary | ICD-10-CM | POA: Diagnosis not present

## 2016-08-27 DIAGNOSIS — Z6823 Body mass index (BMI) 23.0-23.9, adult: Secondary | ICD-10-CM | POA: Diagnosis not present

## 2016-08-27 DIAGNOSIS — R413 Other amnesia: Secondary | ICD-10-CM | POA: Diagnosis not present

## 2016-10-31 DIAGNOSIS — M545 Low back pain: Secondary | ICD-10-CM | POA: Diagnosis not present

## 2016-10-31 DIAGNOSIS — Z96649 Presence of unspecified artificial hip joint: Secondary | ICD-10-CM | POA: Diagnosis not present

## 2016-10-31 DIAGNOSIS — Z96642 Presence of left artificial hip joint: Secondary | ICD-10-CM | POA: Diagnosis not present

## 2016-10-31 DIAGNOSIS — Z4789 Encounter for other orthopedic aftercare: Secondary | ICD-10-CM | POA: Diagnosis not present

## 2016-12-29 ENCOUNTER — Other Ambulatory Visit: Payer: Self-pay | Admitting: Internal Medicine

## 2016-12-29 DIAGNOSIS — Z9889 Other specified postprocedural states: Secondary | ICD-10-CM

## 2017-01-15 DIAGNOSIS — I10 Essential (primary) hypertension: Secondary | ICD-10-CM | POA: Diagnosis not present

## 2017-01-15 DIAGNOSIS — N390 Urinary tract infection, site not specified: Secondary | ICD-10-CM | POA: Diagnosis not present

## 2017-01-15 DIAGNOSIS — M810 Age-related osteoporosis without current pathological fracture: Secondary | ICD-10-CM | POA: Diagnosis not present

## 2017-01-15 DIAGNOSIS — E784 Other hyperlipidemia: Secondary | ICD-10-CM | POA: Diagnosis not present

## 2017-01-15 DIAGNOSIS — R8299 Other abnormal findings in urine: Secondary | ICD-10-CM | POA: Diagnosis not present

## 2017-01-22 DIAGNOSIS — N390 Urinary tract infection, site not specified: Secondary | ICD-10-CM | POA: Diagnosis not present

## 2017-01-22 DIAGNOSIS — E784 Other hyperlipidemia: Secondary | ICD-10-CM | POA: Diagnosis not present

## 2017-01-22 DIAGNOSIS — K449 Diaphragmatic hernia without obstruction or gangrene: Secondary | ICD-10-CM | POA: Diagnosis not present

## 2017-01-22 DIAGNOSIS — M810 Age-related osteoporosis without current pathological fracture: Secondary | ICD-10-CM | POA: Diagnosis not present

## 2017-01-22 DIAGNOSIS — D0512 Intraductal carcinoma in situ of left breast: Secondary | ICD-10-CM | POA: Diagnosis not present

## 2017-01-22 DIAGNOSIS — Z0000 Encounter for general adult medical examination without abnormal findings: Secondary | ICD-10-CM | POA: Diagnosis not present

## 2017-01-22 DIAGNOSIS — K5909 Other constipation: Secondary | ICD-10-CM | POA: Diagnosis not present

## 2017-01-22 DIAGNOSIS — R413 Other amnesia: Secondary | ICD-10-CM | POA: Diagnosis not present

## 2017-01-22 DIAGNOSIS — I10 Essential (primary) hypertension: Secondary | ICD-10-CM | POA: Diagnosis not present

## 2017-02-02 ENCOUNTER — Encounter (HOSPITAL_COMMUNITY): Payer: Self-pay | Admitting: *Deleted

## 2017-02-02 ENCOUNTER — Emergency Department (HOSPITAL_COMMUNITY): Payer: Medicare HMO

## 2017-02-02 ENCOUNTER — Inpatient Hospital Stay (HOSPITAL_COMMUNITY)
Admission: EM | Admit: 2017-02-02 | Discharge: 2017-02-05 | DRG: 470 | Disposition: A | Discharge status: Skilled Nursing Facility | Payer: Medicare HMO | Attending: Internal Medicine | Admitting: Internal Medicine

## 2017-02-02 DIAGNOSIS — M8580 Other specified disorders of bone density and structure, unspecified site: Secondary | ICD-10-CM | POA: Diagnosis present

## 2017-02-02 DIAGNOSIS — Z66 Do not resuscitate: Secondary | ICD-10-CM | POA: Diagnosis present

## 2017-02-02 DIAGNOSIS — Z7982 Long term (current) use of aspirin: Secondary | ICD-10-CM | POA: Diagnosis not present

## 2017-02-02 DIAGNOSIS — E785 Hyperlipidemia, unspecified: Secondary | ICD-10-CM | POA: Diagnosis present

## 2017-02-02 DIAGNOSIS — R918 Other nonspecific abnormal finding of lung field: Secondary | ICD-10-CM | POA: Diagnosis not present

## 2017-02-02 DIAGNOSIS — S72009A Fracture of unspecified part of neck of unspecified femur, initial encounter for closed fracture: Secondary | ICD-10-CM | POA: Diagnosis not present

## 2017-02-02 DIAGNOSIS — M6281 Muscle weakness (generalized): Secondary | ICD-10-CM | POA: Diagnosis not present

## 2017-02-02 DIAGNOSIS — Y9239 Other specified sports and athletic area as the place of occurrence of the external cause: Secondary | ICD-10-CM

## 2017-02-02 DIAGNOSIS — Z96649 Presence of unspecified artificial hip joint: Secondary | ICD-10-CM

## 2017-02-02 DIAGNOSIS — Z471 Aftercare following joint replacement surgery: Secondary | ICD-10-CM | POA: Diagnosis not present

## 2017-02-02 DIAGNOSIS — I10 Essential (primary) hypertension: Secondary | ICD-10-CM | POA: Diagnosis not present

## 2017-02-02 DIAGNOSIS — W010XXA Fall on same level from slipping, tripping and stumbling without subsequent striking against object, initial encounter: Secondary | ICD-10-CM | POA: Diagnosis present

## 2017-02-02 DIAGNOSIS — Z01811 Encounter for preprocedural respiratory examination: Secondary | ICD-10-CM

## 2017-02-02 DIAGNOSIS — M25551 Pain in right hip: Secondary | ICD-10-CM | POA: Diagnosis not present

## 2017-02-02 DIAGNOSIS — R52 Pain, unspecified: Secondary | ICD-10-CM | POA: Diagnosis not present

## 2017-02-02 DIAGNOSIS — Z79899 Other long term (current) drug therapy: Secondary | ICD-10-CM

## 2017-02-02 DIAGNOSIS — Z8249 Family history of ischemic heart disease and other diseases of the circulatory system: Secondary | ICD-10-CM | POA: Diagnosis not present

## 2017-02-02 DIAGNOSIS — E860 Dehydration: Secondary | ICD-10-CM | POA: Diagnosis present

## 2017-02-02 DIAGNOSIS — R451 Restlessness and agitation: Secondary | ICD-10-CM | POA: Diagnosis not present

## 2017-02-02 DIAGNOSIS — W19XXXA Unspecified fall, initial encounter: Secondary | ICD-10-CM | POA: Diagnosis present

## 2017-02-02 DIAGNOSIS — R41841 Cognitive communication deficit: Secondary | ICD-10-CM | POA: Diagnosis not present

## 2017-02-02 DIAGNOSIS — F0390 Unspecified dementia without behavioral disturbance: Secondary | ICD-10-CM | POA: Diagnosis not present

## 2017-02-02 DIAGNOSIS — S72001A Fracture of unspecified part of neck of right femur, initial encounter for closed fracture: Secondary | ICD-10-CM | POA: Diagnosis present

## 2017-02-02 DIAGNOSIS — Z853 Personal history of malignant neoplasm of breast: Secondary | ICD-10-CM

## 2017-02-02 DIAGNOSIS — D72829 Elevated white blood cell count, unspecified: Secondary | ICD-10-CM | POA: Diagnosis not present

## 2017-02-02 DIAGNOSIS — K449 Diaphragmatic hernia without obstruction or gangrene: Secondary | ICD-10-CM | POA: Diagnosis not present

## 2017-02-02 DIAGNOSIS — K5900 Constipation, unspecified: Secondary | ICD-10-CM | POA: Diagnosis not present

## 2017-02-02 DIAGNOSIS — S72041A Displaced fracture of base of neck of right femur, initial encounter for closed fracture: Secondary | ICD-10-CM | POA: Diagnosis not present

## 2017-02-02 DIAGNOSIS — S79919A Unspecified injury of unspecified hip, initial encounter: Secondary | ICD-10-CM | POA: Diagnosis not present

## 2017-02-02 DIAGNOSIS — R0902 Hypoxemia: Secondary | ICD-10-CM | POA: Diagnosis not present

## 2017-02-02 DIAGNOSIS — Z96642 Presence of left artificial hip joint: Secondary | ICD-10-CM | POA: Diagnosis not present

## 2017-02-02 DIAGNOSIS — Z96641 Presence of right artificial hip joint: Secondary | ICD-10-CM | POA: Diagnosis not present

## 2017-02-02 DIAGNOSIS — Z01810 Encounter for preprocedural cardiovascular examination: Secondary | ICD-10-CM | POA: Diagnosis not present

## 2017-02-02 DIAGNOSIS — R262 Difficulty in walking, not elsewhere classified: Secondary | ICD-10-CM | POA: Diagnosis not present

## 2017-02-02 LAB — CBC WITH DIFFERENTIAL/PLATELET
Basophils Absolute: 0 10*3/uL (ref 0.0–0.1)
Basophils Relative: 0 %
EOS ABS: 0.1 10*3/uL (ref 0.0–0.7)
Eosinophils Relative: 1 %
HEMATOCRIT: 40.8 % (ref 36.0–46.0)
HEMOGLOBIN: 14 g/dL (ref 12.0–15.0)
LYMPHS ABS: 1.3 10*3/uL (ref 0.7–4.0)
Lymphocytes Relative: 12 %
MCH: 31.3 pg (ref 26.0–34.0)
MCHC: 34.3 g/dL (ref 30.0–36.0)
MCV: 91.3 fL (ref 78.0–100.0)
MONOS PCT: 7 %
Monocytes Absolute: 0.8 10*3/uL (ref 0.1–1.0)
NEUTROS ABS: 8.9 10*3/uL — AB (ref 1.7–7.7)
NEUTROS PCT: 80 %
Platelets: 214 10*3/uL (ref 150–400)
RBC: 4.47 MIL/uL (ref 3.87–5.11)
RDW: 12.7 % (ref 11.5–15.5)
WBC: 11.1 10*3/uL — ABNORMAL HIGH (ref 4.0–10.5)

## 2017-02-02 LAB — BASIC METABOLIC PANEL
Anion gap: 7 (ref 5–15)
BUN: 19 mg/dL (ref 6–20)
CHLORIDE: 107 mmol/L (ref 101–111)
CO2: 27 mmol/L (ref 22–32)
CREATININE: 0.95 mg/dL (ref 0.44–1.00)
Calcium: 9.3 mg/dL (ref 8.9–10.3)
GFR calc Af Amer: >60 mL/min (ref >60)
GFR calc non Af Amer: 53 mL/min — ABNORMAL LOW (ref >60)
GLUCOSE: 99 mg/dL (ref 65–99)
Potassium: 4.2 mmol/L (ref 3.5–5.1)
Sodium: 141 mmol/L (ref 135–145)

## 2017-02-02 LAB — SURGICAL PCR SCREEN
MRSA, PCR: NEGATIVE
STAPHYLOCOCCUS AUREUS: NEGATIVE

## 2017-02-02 LAB — PROTIME-INR
INR: 1.05
Prothrombin Time: 13.7 seconds (ref 11.4–15.2)

## 2017-02-02 MED ORDER — SODIUM CHLORIDE 0.9 % IV BOLUS (SEPSIS): 500.0000 mL | Freq: Once | INTRAVENOUS | Status: DC

## 2017-02-02 MED ORDER — SIMVASTATIN 20 MG PO TABS
20.0000 mg | ORAL_TABLET | Freq: Every day | ORAL | Status: DC
  Administered 2017-02-04 – 2017-02-05 (×2): 20 mg via ORAL
  Filled 2017-02-02 (×2): qty 1

## 2017-02-02 MED ORDER — CALCIUM CARBONATE-VITAMIN D 500-200 MG-UNIT PO TABS
1.0000 | ORAL_TABLET | Freq: Every day | ORAL | Status: DC
  Administered 2017-02-04 – 2017-02-05 (×2): 1 via ORAL
  Filled 2017-02-02 (×2): qty 1

## 2017-02-02 MED ORDER — SODIUM CHLORIDE 0.9 % IV SOLN
INTRAVENOUS | Status: DC
  Administered 2017-02-02: 75 mL/h via INTRAVENOUS
  Administered 2017-02-03: 06:00:00 via INTRAVENOUS
  Administered 2017-02-03: 18:00:00 75 mL/h via INTRAVENOUS

## 2017-02-02 MED ORDER — FENTANYL CITRATE (PF) 100 MCG/2ML IJ SOLN
75.0000 ug | Freq: Once | INTRAMUSCULAR | Status: AC
Start: 2017-02-02 — End: 2017-02-02
  Administered 2017-02-02: 13:00:00 via INTRAVENOUS
  Filled 2017-02-02: qty 2

## 2017-02-02 MED ORDER — QUETIAPINE FUMARATE 25 MG PO TABS: 25.0000 mg | ORAL_TABLET | Freq: Every evening | ORAL | Status: DC | PRN

## 2017-02-02 MED ORDER — HEPARIN SODIUM (PORCINE) 5000 UNIT/ML IJ SOLN
5000.0000 [IU] | Freq: Three times a day (TID) | INTRAMUSCULAR | Status: DC
  Administered 2017-02-02 – 2017-02-04 (×3): 5000 [IU] via SUBCUTANEOUS
  Filled 2017-02-02 (×3): qty 1

## 2017-02-02 MED ORDER — DONEPEZIL HCL 10 MG PO TABS
10.0000 mg | ORAL_TABLET | Freq: Every evening | ORAL | Status: DC
  Administered 2017-02-02 – 2017-02-04 (×3): 10 mg via ORAL
  Filled 2017-02-02: qty 1
  Filled 2017-02-02 (×2): qty 2

## 2017-02-02 MED ORDER — CALCIUM CARBONATE-VITAMIN D 500-50 MG-UNIT PO CAPS: ORAL_CAPSULE | Freq: Every day | ORAL | Status: DC

## 2017-02-02 MED ORDER — ACETAMINOPHEN 325 MG PO TABS
650.0000 mg | ORAL_TABLET | ORAL | Status: DC | PRN
  Administered 2017-02-02 – 2017-02-04 (×4): 650 mg via ORAL
  Filled 2017-02-02 (×4): qty 2

## 2017-02-02 MED ORDER — IBUPROFEN 400 MG PO TABS: 400.0000 mg | ORAL_TABLET | ORAL | Status: DC | PRN

## 2017-02-02 MED ORDER — FENTANYL CITRATE (PF) 100 MCG/2ML IJ SOLN
75.0000 ug | Freq: Once | INTRAMUSCULAR | Status: AC
  Administered 2017-02-02: 75 ug via INTRAVENOUS
  Filled 2017-02-02: qty 2

## 2017-02-02 NOTE — ED Notes (Signed)
Report given to Delton

## 2017-02-02 NOTE — ED Notes (Signed)
Patient transported to X-ray 

## 2017-02-02 NOTE — ED Provider Notes (Signed)
[place] DEPT Provider Note   CSN: [id number] Arrival date & time: 02/02/17  1010     History   Chief Complaint Chief Complaint  Patient presents with  . Hip Injury    HPI Sarah Moyer is a 83 y.o. female.  The history is provided by the patient.  Hip Pain  This is a new problem. The current episode started 1 to 2 hours ago. The problem occurs constantly. The problem has not changed since onset.Pertinent negatives include no chest pain, no abdominal pain, no headaches and no shortness of breath. Exacerbated by: movement. Nothing relieves the symptoms.    Past Medical History:  Diagnosis Date  . Arrhythmia    left BBB,    PVC'S  . Cancer ([place]) 10/2013   LEFT BREAST CANCER--LUMPECTOMY  . DDD (degenerative disc disease)   . Diverticular disease    With colonic polyps  . Hiatal hernia   . Hyperlipidemia   . Hypertension     no meds    dr Radene Gunning  . Osteopenia     Patient Active Problem List   Diagnosis Date Noted  . Closed right hip fracture ([place]) 02/02/2017  . Dizziness 02/15/2014  . Breast cancer of upper-outer quadrant of left female breast ([place]) 11/09/2013  . Hyperlipidemia 06/20/2012  . Osteopenia 06/20/2012  . Leukocytosis 06/20/2012  . Fracture of femoral neck, left, closed ([place]) 06/20/2012  . ESSENTIAL HYPERTENSION, BENIGN 09/12/2010  . DYSPNEA 09/12/2010  . ELECTROCARDIOGRAM, ABNORMAL 09/12/2010    Past Surgical History:  Procedure Laterality Date  . APPENDECTOMY    . BREAST LUMPECTOMY WITH NEEDLE LOCALIZATION Left 11/25/2013   Procedure: LEFT BREAST WIRE GUIDED LUMPECTOMY;  Surgeon: Rolm Bookbinder, MD;  Location: [place];  Service: General;  Laterality: Left;  . BREAST SURGERY    . CATARACT EXTRACTION    . HIP ARTHROPLASTY  06/21/2012   Procedure: ARTHROPLASTY BIPOLAR HIP;  Surgeon: Mauri Pole, MD;  Location: [facility];  Service: Orthopedics;  Laterality: Left;    OB History    No data available       Home Medications    Prior to  Admission medications   Medication Sig Start Date End Date Taking? Authorizing Provider  acetaminophen (TYLENOL) 500 MG tablet Take 500 mg by mouth every 6 (six) hours as needed. 11/06/16  Yes Historical Provider, MD  aspirin EC 81 MG tablet Take 81 mg by mouth daily.   Yes Historical Provider, MD  Calcium Carbonate-Vitamin D (CALCIUM PLUS VITAMIN D PO) Take by mouth daily.   Yes Historical Provider, MD  donepezil (ARICEPT) 10 MG tablet Take 10 mg by mouth every evening. 01/21/17  Yes Historical Provider, MD  simvastatin (ZOCOR) 20 MG tablet Take 20 mg by mouth daily.    Yes Historical Provider, MD    Family History Family History  Problem Relation Age of Onset  . Heart Problems Mother     Died age 56, no details/Died age 63, no details  . CAD Brother 89  . CAD Brother 11    Social History Social History  Substance Use Topics  . Smoking status: Never Smoker  . Smokeless tobacco: Never Used  . Alcohol use No     Allergies   Patient has no known allergies.   Review of Systems Review of Systems  Respiratory: Negative for shortness of breath.   Cardiovascular: Negative for chest pain.  Gastrointestinal: Negative for abdominal pain.  Neurological: Negative for headaches.  All other systems are reviewed and are negative  for acute change except as noted in the HPI    Physical Exam Updated Vital Signs BP (!) 160/73 (BP Location: Right Arm)   Pulse 61   Temp 98.8 F (37.1 C) (Oral)   Resp 16   Ht 5\' 4"  (1.626 m)   Wt 130 lb (59 kg)   SpO2 100%   BMI 22.31 kg/m   Physical Exam  Constitutional: She is oriented to person, place, and time. She appears well-developed and well-nourished. No distress.  HENT:  Head: Normocephalic and atraumatic.  Nose: Nose normal.  Eyes: Conjunctivae and EOM are normal. Pupils are equal, round, and reactive to light. Right eye exhibits no discharge. Left eye exhibits no discharge. No scleral icterus.  Neck: Normal range of motion. Neck  supple.  Cardiovascular: Normal rate and regular rhythm.  Exam reveals no gallop and no friction rub.   No murmur heard. Pulses:      Dorsalis pedis pulses are 1+ on the right side, and 1+ on the left side.  Pulmonary/Chest: Effort normal and breath sounds normal. No stridor. No respiratory distress. She has no rales.  Abdominal: Soft. She exhibits no distension. There is no tenderness.  Musculoskeletal: She exhibits no edema.       Right hip: She exhibits tenderness and bony tenderness. She exhibits normal range of motion.  Neurological: She is alert and oriented to person, place, and time.  Skin: Skin is warm and dry. No rash noted. She is not diaphoretic. No erythema.  Psychiatric: She has a normal mood and affect.  Vitals reviewed.    ED Treatments / Results  Labs (all labs ordered are listed, but only abnormal results are displayed) Labs Reviewed  CBC WITH DIFFERENTIAL/PLATELET - Abnormal; Notable for the following:       Result Value   WBC 11.1 (*)    Neutro Abs 8.9 (*)    All other components within normal limits  BASIC METABOLIC PANEL - Abnormal; Notable for the following:    GFR calc non Af Amer 53 (*)    All other components within normal limits  PROTIME-INR  CBC  CREATININE, SERUM    EKG  EKG Interpretation None       Radiology Dg Chest Port 1 View  Result Date: 02/02/2017 CLINICAL DATA:  Pre operative respiratory exam.  Right hip fracture. EXAM: PORTABLE CHEST 1 VIEW COMPARISON:  11/23/2013 FINDINGS: Heart size and pulmonary vascularity are normal. Large chronic hiatal hernia. Lungs are clear. IMPRESSION: Large chronic hiatal hernia.  No acute abnormality. Electronically Signed   By: Lorriane Shire M.D.   On: 02/02/2017 11:14   Dg Hip Unilat W Or Wo Pelvis 2-3 Views Right  Result Date: 02/02/2017 CLINICAL DATA:  Right hip pain secondary to a fall at home this morning. EXAM: DG HIP (WITH OR WITHOUT PELVIS) 2-3V RIGHT COMPARISON:  Radiographs dated 06/21/2012  and 06/20/2012 FINDINGS: There is a slightly impacted subcapital fracture of the right femoral neck. Pelvic bones are intact. Proximal left femoral prosthesis in place. IMPRESSION: Right femoral neck fracture. Electronically Signed   By: Lorriane Shire M.D.   On: 02/02/2017 10:46    Procedures Procedures (including critical care time)  Medications Ordered in ED Medications  sodium chloride 0.9 % bolus 500 mL (not administered)  donepezil (ARICEPT) tablet 10 mg (not administered)  simvastatin (ZOCOR) tablet 20 mg (not administered)  Calcium Carbonate-Vitamin D 500-50 MG-UNIT CAPS (not administered)  heparin injection 5,000 Units (not administered)  acetaminophen (TYLENOL) tablet 650 mg (not administered)  ibuprofen (ADVIL,MOTRIN) tablet 400 mg (not administered)  QUEtiapine (SEROQUEL) tablet 25 mg (not administered)  fentaNYL (SUBLIMAZE) injection 75 mcg (75 mcg Intravenous Given 02/02/17 1109)  fentaNYL (SUBLIMAZE) injection 75 mcg ( Intravenous Given 02/02/17 1244)     Initial Impression / Assessment and Plan / ED Course  I have reviewed the triage vital signs and the nursing notes.  Pertinent labs & imaging results that were available during my care of the patient were reviewed by me and considered in my medical decision making (see chart for details).     Right femoral neck fracture. No other injuries on exam. Given pain meds. preop labs and imaging ordered. Ortho consulted.  Pt admitted to Hospitalist for pain control.    Final Clinical Impressions(s) / ED Diagnoses   Final diagnoses:  Pre-op chest exam  Closed displaced fracture of right femoral neck ([place])      Fatima Blank, MD 02/02/17 [phone]

## 2017-02-02 NOTE — ED Triage Notes (Signed)
Pt brought in by EMS fro fall and right hip injury

## 2017-02-02 NOTE — ED Notes (Signed)
Bed: WA09 Expected date:  Expected time:  Means of arrival:  Comments: EMS 80s hip pain

## 2017-02-02 NOTE — ED Notes (Signed)
Family at bedside. 

## 2017-02-02 NOTE — H&P (Addendum)
History and Physical    Sarah Moyer:[id number] DOB: 1930/05/08 DOA: 02/02/2017    PCP: Tivis Ringer, MD  Patient coming from: home  Chief Complaint: fall and right hip pain  HPI: Sarah Moyer is a 83 y.o. female with medical history of L breast CA s/p treatment, HTN, dementia who was exercising at a [employer] class at the [facility] when she fell and developed R hip pain. She is found to have a R hip fracture and Ortho has asked for an admission. They will see her tomorrow.   ED Course:  BP 160/73 Right femoral neck fx and slightly impacted subcapital fx CXR >> Large chronic hiatal hernia   Review of Systems:  All other systems reviewed and apart from HPI, are negative.  Past Medical History:  Diagnosis Date  . Arrhythmia    left BBB,    PVC'S  . Cancer ([place]) 10/2013   LEFT BREAST CANCER--LUMPECTOMY  . DDD (degenerative disc disease)   . Diverticular disease    With colonic polyps  . Hiatal hernia   . Hyperlipidemia   . Hypertension     no meds    dr Radene Gunning  . Osteopenia     Past Surgical History:  Procedure Laterality Date  . APPENDECTOMY    . BREAST LUMPECTOMY WITH NEEDLE LOCALIZATION Left 11/25/2013   Procedure: LEFT BREAST WIRE GUIDED LUMPECTOMY;  Surgeon: Rolm Bookbinder, MD;  Location: [facility];  Service: General;  Laterality: Left;  . BREAST SURGERY    . CATARACT EXTRACTION    . HIP ARTHROPLASTY  06/21/2012   Procedure: ARTHROPLASTY BIPOLAR HIP;  Surgeon: Mauri Pole, MD;  Location: [facility];  Service: Orthopedics;  Laterality: Left;    Social History:   reports that she has never smoked. She has never used smokeless tobacco. She reports that she does not drink alcohol or use drugs.  Lives with her husband.   No Known Allergies  Family History  Problem Relation Age of Onset  . Heart Problems Mother     Died age 36, no details/Died age 12, no details  . CAD Brother 4  . CAD Brother 54     Prior to Admission medications   Medication  Sig Start Date End Date Taking? Authorizing Provider  acetaminophen (TYLENOL) 500 MG tablet Take 500 mg by mouth every 6 (six) hours as needed. 11/06/16  Yes Historical Provider, MD  aspirin EC 81 MG tablet Take 81 mg by mouth daily.   Yes Historical Provider, MD  Calcium Carbonate-Vitamin D (CALCIUM PLUS VITAMIN D PO) Take by mouth daily.   Yes Historical Provider, MD  donepezil (ARICEPT) 10 MG tablet Take 10 mg by mouth every evening. 01/21/17  Yes Historical Provider, MD  simvastatin (ZOCOR) 20 MG tablet Take 20 mg by mouth daily.    Yes Historical Provider, MD    Physical Exam: Vitals:   02/02/17 1018 02/02/17 1021  BP: (!) 160/73   Pulse: 61   Resp: 16   Temp: 98.8 F (37.1 C)   TempSrc: Oral   SpO2: 100%   Weight:  59 kg (130 lb)  Height:  5\' 4"  (1.626 m)      Constitutional: NAD, calm, comfortable Eyes: PERTLA, lids and conjunctivae normal ENMT: Mucous membranes are dry. Posterior pharynx clear of any exudate or lesions. Normal dentition.  Neck: normal, supple, no masses, no thyromegaly Respiratory: clear to auscultation bilaterally, no wheezing, no crackles. Normal respiratory effort. No accessory muscle use.  Cardiovascular: S1 &  S2 heard, regular rate and rhythm, no murmurs / rubs / gallops. No extremity edema. 2+ pedal pulses. No carotid bruits.  Abdomen: No distension, no tenderness, no masses palpated. No hepatosplenomegaly. Bowel sounds normal.  Musculoskeletal: no clubbing / cyanosis. No joint deformity upper and lower extremities. Good ROM, no contractures. Normal muscle tone.  Skin: no rashes, lesions, ulcers. No induration Neurologic: CN 2-12 grossly intact. Sensation intact, DTR normal. Strength 5/5 in all 4 limbs.  Psychiatric: Normal judgment and insight. Alert and oriented x 2. Normal mood.     Labs on Admission: I have personally reviewed following labs and imaging studies  CBC:  Recent Labs Lab 02/02/17 1104  WBC 11.1*  NEUTROABS 8.9*  HGB 14.0    HCT 40.8  MCV 91.3  PLT 767   Basic Metabolic Panel:  Recent Labs Lab 02/02/17 1104  NA 141  K 4.2  CL 107  CO2 27  GLUCOSE 99  BUN 19  CREATININE 0.95  CALCIUM 9.3   GFR: Estimated Creatinine Clearance: 38.1 mL/min (by C-G formula based on SCr of 0.95 mg/dL). Liver Function Tests: No results for input(s): AST, ALT, ALKPHOS, BILITOT, PROT, ALBUMIN in the last 168 hours. No results for input(s): LIPASE, AMYLASE in the last 168 hours. No results for input(s): AMMONIA in the last 168 hours. Coagulation Profile:  Recent Labs Lab 02/02/17 1104  INR 1.05   Cardiac Enzymes: No results for input(s): CKTOTAL, CKMB, CKMBINDEX, TROPONINI in the last 168 hours. BNP (last 3 results) No results for input(s): PROBNP in the last 8760 hours. HbA1C: No results for input(s): HGBA1C in the last 72 hours. CBG: No results for input(s): GLUCAP in the last 168 hours. Lipid Profile: No results for input(s): CHOL, HDL, LDLCALC, TRIG, CHOLHDL, LDLDIRECT in the last 72 hours. Thyroid Function Tests: No results for input(s): TSH, T4TOTAL, FREET4, T3FREE, THYROIDAB in the last 72 hours. Anemia Panel: No results for input(s): VITAMINB12, FOLATE, FERRITIN, TIBC, IRON, RETICCTPCT in the last 72 hours. Urine analysis:    Component Value Date/Time   COLORURINE YELLOW 06/20/2012 1802   APPEARANCEUR CLEAR 06/20/2012 1802   LABSPEC 1.016 06/20/2012 1802   PHURINE 7.0 06/20/2012 1802   GLUCOSEU NEGATIVE 06/20/2012 1802   HGBUR SMALL (A) 06/20/2012 1802   BILIRUBINUR NEGATIVE 06/20/2012 1802   KETONESUR 15 (A) 06/20/2012 1802   PROTEINUR NEGATIVE 06/20/2012 1802   UROBILINOGEN 1.0 06/20/2012 1802   NITRITE NEGATIVE 06/20/2012 1802   LEUKOCYTESUR NEGATIVE 06/20/2012 1802   Sepsis Labs: @LABRCNTIP (procalcitonin:4,lacticidven:4) )No results found for this or any previous visit (from the past 240 hour(s)).   Radiological Exams on Admission: Dg Chest Port 1 View  Result Date:  02/02/2017 CLINICAL DATA:  Pre operative respiratory exam.  Right hip fracture. EXAM: PORTABLE CHEST 1 VIEW COMPARISON:  11/23/2013 FINDINGS: Heart size and pulmonary vascularity are normal. Large chronic hiatal hernia. Lungs are clear. IMPRESSION: Large chronic hiatal hernia.  No acute abnormality. Electronically Signed   By: Lorriane Shire M.D.   On: 02/02/2017 11:14   Dg Hip Unilat W Or Wo Pelvis 2-3 Views Right  Result Date: 02/02/2017 CLINICAL DATA:  Right hip pain secondary to a fall at home this morning. EXAM: DG HIP (WITH OR WITHOUT PELVIS) 2-3V RIGHT COMPARISON:  Radiographs dated 06/21/2012 and 06/20/2012 FINDINGS: There is a slightly impacted subcapital fracture of the right femoral neck. Pelvic bones are intact. Proximal left femoral prosthesis in place. IMPRESSION: Right femoral neck fracture. Electronically Signed   By: Lorriane Shire M.D.  On: 02/02/2017 10:46    EKG: Independently reviewed. Sinus rhythm with LBBB (chronic) with LAD   Assessment/Plan Principal Problem:   Closed right hip fracture  - per ortho- I have been given the message that she will be evaluated tomorrow -pain control>> Given 75 mcg of Fentanyl in ER. I have ordered Tylenol and Motrin PRN.  - no recent chest pain or pressure or severe dyspnea with exertion (if history is accurate)- EKG unchanged- mild risk for hip replacement  Active Problems:   Essential hypertension, benign - not medically treated- follow    Hyperlipidemia - statin    Dementia - Aricept- when she had her last hip surgery, she did become anxious and restless at night- I have therefore ordered PRN Seroquel  Mild dehydration - NS @ 75 cc/ hr for 1 L      DVT prophylaxis: Heparin  Code Status: DNR  Family Communication: daughter and husband at bedside   Disposition Plan: med/surg  Consults called: ortho, Dr Alvan Dame called by ER  Admission status: inpatient    Debbe Odea MD [facility] Pager: [email or web address] Password  [id number] 7PM-7AM, please contact night-coverage   02/02/2017, 1:20 PM

## 2017-02-03 ENCOUNTER — Encounter (HOSPITAL_COMMUNITY): Payer: Self-pay | Admitting: Orthopedic Surgery

## 2017-02-03 ENCOUNTER — Inpatient Hospital Stay (HOSPITAL_COMMUNITY): Payer: Medicare HMO

## 2017-02-03 ENCOUNTER — Inpatient Hospital Stay (HOSPITAL_COMMUNITY): Payer: Medicare HMO | Admitting: Certified Registered Nurse Anesthetist

## 2017-02-03 ENCOUNTER — Encounter (HOSPITAL_COMMUNITY)
Admission: EM | Disposition: A | Discharge status: Skilled Nursing Facility | Payer: Self-pay | Source: Home / Self Care | Attending: Internal Medicine

## 2017-02-03 DIAGNOSIS — I10 Essential (primary) hypertension: Secondary | ICD-10-CM

## 2017-02-03 DIAGNOSIS — S72001A Fracture of unspecified part of neck of right femur, initial encounter for closed fracture: Principal | ICD-10-CM

## 2017-02-03 DIAGNOSIS — F0390 Unspecified dementia without behavioral disturbance: Secondary | ICD-10-CM | POA: Diagnosis not present

## 2017-02-03 DIAGNOSIS — E785 Hyperlipidemia, unspecified: Secondary | ICD-10-CM | POA: Diagnosis not present

## 2017-02-03 HISTORY — PX: HIP ARTHROPLASTY: SHX981

## 2017-02-03 LAB — BASIC METABOLIC PANEL
Anion gap: 4 — ABNORMAL LOW (ref 5–15)
BUN: 19 mg/dL (ref 6–20)
CHLORIDE: 109 mmol/L (ref 101–111)
CO2: 26 mmol/L (ref 22–32)
CREATININE: 1 mg/dL (ref 0.44–1.00)
Calcium: 8.7 mg/dL — ABNORMAL LOW (ref 8.9–10.3)
GFR calc Af Amer: 58 mL/min — ABNORMAL LOW (ref >60)
GFR calc non Af Amer: 50 mL/min — ABNORMAL LOW (ref >60)
GLUCOSE: 97 mg/dL (ref 65–99)
Potassium: 4.3 mmol/L (ref 3.5–5.1)
Sodium: 139 mmol/L (ref 135–145)

## 2017-02-03 LAB — CBC
HCT: 36.5 % (ref 36.0–46.0)
Hemoglobin: 12.3 g/dL (ref 12.0–15.0)
MCH: 31.3 pg (ref 26.0–34.0)
MCHC: 33.7 g/dL (ref 30.0–36.0)
MCV: 92.9 fL (ref 78.0–100.0)
PLATELETS: 180 10*3/uL (ref 150–400)
RBC: 3.93 MIL/uL (ref 3.87–5.11)
RDW: 12.7 % (ref 11.5–15.5)
WBC: 8.6 10*3/uL (ref 4.0–10.5)

## 2017-02-03 SURGERY — HEMIARTHROPLASTY, HIP, DIRECT ANTERIOR APPROACH, FOR FRACTURE
Anesthesia: General | Site: Hip | Laterality: Right

## 2017-02-03 MED ORDER — ROCURONIUM BROMIDE 50 MG/5ML IV SOSY
PREFILLED_SYRINGE | INTRAVENOUS | Status: AC
  Filled 2017-02-03: qty 5

## 2017-02-03 MED ORDER — ROCURONIUM BROMIDE 50 MG/5ML IV SOSY
PREFILLED_SYRINGE | INTRAVENOUS | Status: DC | PRN
  Administered 2017-02-03: 25 mg via INTRAVENOUS
  Administered 2017-02-03: 10 mg via INTRAVENOUS

## 2017-02-03 MED ORDER — FENTANYL CITRATE (PF) 100 MCG/2ML IJ SOLN
INTRAMUSCULAR | Status: AC
  Filled 2017-02-03: qty 2

## 2017-02-03 MED ORDER — POVIDONE-IODINE 10 % EX SWAB: 2.0000 "application " | Freq: Once | CUTANEOUS | Status: DC

## 2017-02-03 MED ORDER — SUGAMMADEX SODIUM 200 MG/2ML IV SOLN
INTRAVENOUS | Status: AC
  Filled 2017-02-03: qty 2

## 2017-02-03 MED ORDER — PHENOL 1.4 % MT LIQD: 1.0000 | OROMUCOSAL | Status: DC | PRN

## 2017-02-03 MED ORDER — LIDOCAINE 2% (20 MG/ML) 5 ML SYRINGE
INTRAMUSCULAR | Status: DC | PRN
  Administered 2017-02-03: 60 mg via INTRAVENOUS

## 2017-02-03 MED ORDER — ONDANSETRON HCL 4 MG/2ML IJ SOLN
INTRAMUSCULAR | Status: AC
  Filled 2017-02-03: qty 2

## 2017-02-03 MED ORDER — SUGAMMADEX SODIUM 200 MG/2ML IV SOLN
INTRAVENOUS | Status: DC | PRN
  Administered 2017-02-03: 120 mg via INTRAVENOUS

## 2017-02-03 MED ORDER — CEFAZOLIN IN D5W 1 GM/50ML IV SOLN
1.0000 g | Freq: Four times a day (QID) | INTRAVENOUS | Status: AC
  Administered 2017-02-03 – 2017-02-04 (×2): 1 g via INTRAVENOUS
  Filled 2017-02-03 (×2): qty 50

## 2017-02-03 MED ORDER — STERILE WATER FOR IRRIGATION IR SOLN
Status: DC | PRN
  Administered 2017-02-03: 2000 mL

## 2017-02-03 MED ORDER — CEFAZOLIN SODIUM-DEXTROSE 2-4 GM/100ML-% IV SOLN
2.0000 g | INTRAVENOUS | Status: AC
  Administered 2017-02-03: 2 g via INTRAVENOUS

## 2017-02-03 MED ORDER — METHOCARBAMOL 500 MG PO TABS: 500.0000 mg | ORAL_TABLET | Freq: Four times a day (QID) | ORAL | Status: DC | PRN

## 2017-02-03 MED ORDER — ONDANSETRON HCL 4 MG/2ML IJ SOLN: 4.0000 mg | Freq: Four times a day (QID) | INTRAMUSCULAR | Status: DC | PRN

## 2017-02-03 MED ORDER — FENTANYL CITRATE (PF) 100 MCG/2ML IJ SOLN
25.0000 ug | INTRAMUSCULAR | Status: DC | PRN
  Administered 2017-02-03 (×2): 50 ug via INTRAVENOUS

## 2017-02-03 MED ORDER — ONDANSETRON HCL 4 MG/2ML IJ SOLN
INTRAMUSCULAR | Status: DC | PRN
  Administered 2017-02-03: 4 mg via INTRAVENOUS

## 2017-02-03 MED ORDER — DEXAMETHASONE SODIUM PHOSPHATE 10 MG/ML IJ SOLN
INTRAMUSCULAR | Status: AC
  Filled 2017-02-03: qty 1

## 2017-02-03 MED ORDER — 0.9 % SODIUM CHLORIDE (POUR BTL) OPTIME
TOPICAL | Status: DC | PRN
  Administered 2017-02-03: 1000 mL

## 2017-02-03 MED ORDER — METHOCARBAMOL 1000 MG/10ML IJ SOLN
500.0000 mg | Freq: Four times a day (QID) | INTRAVENOUS | Status: DC | PRN
  Administered 2017-02-03: 500 mg via INTRAVENOUS
  Filled 2017-02-03: qty 5
  Filled 2017-02-03: qty 550

## 2017-02-03 MED ORDER — DEXAMETHASONE SODIUM PHOSPHATE 10 MG/ML IJ SOLN
INTRAMUSCULAR | Status: DC | PRN
  Administered 2017-02-03: 10 mg via INTRAVENOUS

## 2017-02-03 MED ORDER — FERROUS SULFATE 325 (65 FE) MG PO TABS
325.0000 mg | ORAL_TABLET | Freq: Three times a day (TID) | ORAL | Status: DC
  Administered 2017-02-04 – 2017-02-05 (×3): 325 mg via ORAL
  Filled 2017-02-03 (×3): qty 1

## 2017-02-03 MED ORDER — EPHEDRINE 5 MG/ML INJ
INTRAVENOUS | Status: AC
  Filled 2017-02-03: qty 10

## 2017-02-03 MED ORDER — HYDROCODONE-ACETAMINOPHEN 5-325 MG PO TABS
1.0000 | ORAL_TABLET | Freq: Four times a day (QID) | ORAL | Status: DC | PRN
Start: 2017-02-03 — End: 2017-02-05
  Administered 2017-02-03: 2 via ORAL
  Administered 2017-02-03 (×2): 1 via ORAL
  Administered 2017-02-04 – 2017-02-05 (×2): 2 via ORAL
  Filled 2017-02-03 (×3): qty 2
  Filled 2017-02-03: qty 1
  Filled 2017-02-03: qty 2

## 2017-02-03 MED ORDER — FENTANYL CITRATE (PF) 100 MCG/2ML IJ SOLN
25.0000 ug | INTRAMUSCULAR | Status: DC | PRN
  Administered 2017-02-03 (×3): 50 ug via INTRAVENOUS

## 2017-02-03 MED ORDER — DEXAMETHASONE SODIUM PHOSPHATE 10 MG/ML IJ SOLN
8.0000 mg | Freq: Once | INTRAMUSCULAR | Status: DC
Start: 2017-02-03 — End: 2017-02-03

## 2017-02-03 MED ORDER — HYDROCODONE-ACETAMINOPHEN 5-325 MG PO TABS
ORAL_TABLET | ORAL | Status: AC
  Administered 2017-02-03: 15:00:00 1 via ORAL
  Filled 2017-02-03: qty 1

## 2017-02-03 MED ORDER — SUCCINYLCHOLINE CHLORIDE 200 MG/10ML IV SOSY
PREFILLED_SYRINGE | INTRAVENOUS | Status: AC
  Filled 2017-02-03: qty 10

## 2017-02-03 MED ORDER — CEFAZOLIN SODIUM-DEXTROSE 2-4 GM/100ML-% IV SOLN
INTRAVENOUS | Status: AC
  Filled 2017-02-03: qty 100

## 2017-02-03 MED ORDER — OXYCODONE HCL 5 MG PO TABS: 5.0000 mg | ORAL_TABLET | Freq: Once | ORAL | Status: DC | PRN

## 2017-02-03 MED ORDER — SUCCINYLCHOLINE CHLORIDE 200 MG/10ML IV SOSY
PREFILLED_SYRINGE | INTRAVENOUS | Status: DC | PRN
  Administered 2017-02-03: 100 mg via INTRAVENOUS

## 2017-02-03 MED ORDER — PROPOFOL 10 MG/ML IV BOLUS
INTRAVENOUS | Status: AC
  Filled 2017-02-03: qty 40

## 2017-02-03 MED ORDER — LACTATED RINGERS IV SOLN
INTRAVENOUS | Status: DC
  Administered 2017-02-03 (×2): via INTRAVENOUS

## 2017-02-03 MED ORDER — MORPHINE SULFATE (PF) 2 MG/ML IV SOLN: 0.5000 mg | INTRAVENOUS | Status: DC | PRN

## 2017-02-03 MED ORDER — CHLORHEXIDINE GLUCONATE 4 % EX LIQD
60.0000 mL | Freq: Once | CUTANEOUS | Status: DC
Start: 2017-02-03 — End: 2017-02-03

## 2017-02-03 MED ORDER — LIDOCAINE 2% (20 MG/ML) 5 ML SYRINGE
INTRAMUSCULAR | Status: AC
  Filled 2017-02-03: qty 5

## 2017-02-03 MED ORDER — PROPOFOL 10 MG/ML IV BOLUS
INTRAVENOUS | Status: DC | PRN
  Administered 2017-02-03: 100 mg via INTRAVENOUS

## 2017-02-03 MED ORDER — OXYCODONE HCL 5 MG/5ML PO SOLN
5.0000 mg | Freq: Once | ORAL | Status: DC | PRN
  Filled 2017-02-03: qty 5

## 2017-02-03 MED ORDER — FENTANYL CITRATE (PF) 100 MCG/2ML IJ SOLN
INTRAMUSCULAR | Status: DC | PRN
  Administered 2017-02-03 (×4): 50 ug via INTRAVENOUS

## 2017-02-03 MED ORDER — MENTHOL 3 MG MT LOZG: 1.0000 | LOZENGE | OROMUCOSAL | Status: DC | PRN

## 2017-02-03 MED ORDER — ONDANSETRON HCL 4 MG PO TABS
4.0000 mg | ORAL_TABLET | Freq: Four times a day (QID) | ORAL | Status: DC | PRN
  Administered 2017-02-04: 17:00:00 4 mg via ORAL
  Filled 2017-02-03: qty 1

## 2017-02-03 MED ORDER — EPHEDRINE SULFATE 50 MG/ML IJ SOLN
INTRAMUSCULAR | Status: DC | PRN
  Administered 2017-02-03: 10 mg via INTRAVENOUS

## 2017-02-03 SURGICAL SUPPLY — 42 items
BAG ZIPLOCK 12X15 (MISCELLANEOUS) ×4 IMPLANT
BLADE SAW SGTL 11.0X1.19X90.0M (BLADE) ×4 IMPLANT
BLADE SAW SGTL 18X1.27X75 (BLADE) ×3 IMPLANT
BLADE SAW SGTL 18X1.27X75MM (BLADE) ×1
CAPT HIP HEMI 2 ×4 IMPLANT
COVER PERINEAL POST (MISCELLANEOUS) ×4 IMPLANT
COVER SURGICAL LIGHT HANDLE (MISCELLANEOUS) ×8 IMPLANT
DERMABOND ADVANCED (GAUZE/BANDAGES/DRESSINGS) ×2
DERMABOND ADVANCED .7 DNX12 (GAUZE/BANDAGES/DRESSINGS) ×2 IMPLANT
DRAPE INCISE IOBAN 85X60 (DRAPES) ×4 IMPLANT
DRAPE ORTHO SPLIT 77X108 STRL (DRAPES) ×4
DRAPE POUCH INSTRU U-SHP 10X18 (DRAPES) ×4 IMPLANT
DRAPE STERI IOBAN 125X83 (DRAPES) ×4 IMPLANT
DRAPE SURG 17X11 SM STRL (DRAPES) ×4 IMPLANT
DRAPE SURG ORHT 6 SPLT 77X108 (DRAPES) ×4 IMPLANT
DRAPE U-SHAPE 47X51 STRL (DRAPES) ×12 IMPLANT
DRESSING AQUACEL AG SP 3.5X10 (GAUZE/BANDAGES/DRESSINGS) ×2 IMPLANT
DRSG AQUACEL AG SP 3.5X10 (GAUZE/BANDAGES/DRESSINGS) ×4
DURAPREP 26ML APPLICATOR (WOUND CARE) ×8 IMPLANT
ELECT BLADE TIP CTD 4 INCH (ELECTRODE) ×4 IMPLANT
ELECT REM PT RETURN 15FT ADLT (MISCELLANEOUS) ×8 IMPLANT
EVACUATOR 1/8 PVC DRAIN (DRAIN) ×4 IMPLANT
FACESHIELD WRAPAROUND (MASK) ×16 IMPLANT
GLOVE BIOGEL M 7.0 STRL (GLOVE) ×8 IMPLANT
GLOVE BIOGEL PI IND STRL 7.5 (GLOVE) ×10 IMPLANT
GLOVE BIOGEL PI INDICATOR 7.5 (GLOVE) ×10
GLOVE ORTHO TXT STRL SZ7.5 (GLOVE) ×8 IMPLANT
GLOVE SURG SS PI 7.0 STRL IVOR (GLOVE) ×4 IMPLANT
GOWN STRL REUS W/TWL LRG LVL3 (GOWN DISPOSABLE) ×4 IMPLANT
GOWN STRL REUS W/TWL XL LVL3 (GOWN DISPOSABLE) ×12 IMPLANT
KIT BASIN OR (CUSTOM PROCEDURE TRAY) ×8 IMPLANT
MANIFOLD NEPTUNE II (INSTRUMENTS) ×8 IMPLANT
PACK GENERAL/GYN (CUSTOM PROCEDURE TRAY) ×4 IMPLANT
PACK TOTAL JOINT (CUSTOM PROCEDURE TRAY) ×4 IMPLANT
POSITIONER SURGICAL ARM (MISCELLANEOUS) ×8 IMPLANT
SUT ETHIBOND NAB CT1 #1 30IN (SUTURE) ×4 IMPLANT
SUT MNCRL AB 4-0 PS2 18 (SUTURE) ×8 IMPLANT
SUT VIC AB 1 CT1 36 (SUTURE) ×8 IMPLANT
SUT VIC AB 2-0 CT1 27 (SUTURE) ×8
SUT VIC AB 2-0 CT1 TAPERPNT 27 (SUTURE) ×8 IMPLANT
SUT VLOC 180 0 24IN GS25 (SUTURE) ×4 IMPLANT
TOWEL OR 17X26 10 PK STRL BLUE (TOWEL DISPOSABLE) ×12 IMPLANT

## 2017-02-03 NOTE — Anesthesia Procedure Notes (Signed)
Procedure Name: Intubation Date/Time: 02/03/2017 11:24 AM Performed by: Anne Fu Pre-anesthesia Checklist: Patient identified, Emergency Drugs available, Suction available, Patient being monitored and Timeout performed Patient Re-evaluated:Patient Re-evaluated prior to inductionOxygen Delivery Method: Circle system utilized Preoxygenation: Pre-oxygenation with 100% oxygen Intubation Type: IV induction Ventilation: Mask ventilation without difficulty Laryngoscope Size: Mac and 4 Grade View: Grade I Tube type: Oral Tube size: 7.5 mm Number of attempts: 1 Airway Equipment and Method: Stylet Placement Confirmation: ETT inserted through vocal cords under direct vision,  positive ETCO2,  CO2 detector and breath sounds checked- equal and bilateral Secured at: 21 cm Tube secured with: Tape Dental Injury: Teeth and Oropharynx as per pre-operative assessment  Comments: [employer] student X 1 attempt with success.

## 2017-02-03 NOTE — Transfer of Care (Signed)
Immediate Anesthesia Transfer of Care Note  Patient: Sarah Moyer  Procedure(s) Performed: Procedure(s): ARTHROPLASTY BIPOLAR HIP (HEMIARTHROPLASTY) RIGHT (Right)  Patient Location: PACU  Anesthesia Type:General  Level of Consciousness:  sedated, patient cooperative and responds to stimulation  Airway & Oxygen Therapy:Patient Spontanous Breathing and Patient connected to face mask oxgen  Post-op Assessment:  Report given to PACU RN and Post -op Vital signs reviewed and stable  Post vital signs:  Reviewed and stable  Last Vitals:  Vitals:   02/02/17 2212 02/03/17 0542  BP: (!) 140/55 (!) 156/67  Pulse: (!) 48 (!) 59  Resp: 15 16  Temp: 36.8 C 62.9 C    Complications: No apparent anesthesia complications

## 2017-02-03 NOTE — Progress Notes (Signed)
[facility] PROGRESS NOTE  JIA DOTTAVIO ZES:[id number] DOB: 20-Sep-1930 DOA: 02/02/2017  PCP: Tivis Ringer, MD  Brief History/Interval Summary: 83 y.o. female with medical history of L breast CA s/p treatment, HTN, dementia who was exercising at a Silver Sneakers class at the [facility] when she fell and developed R hip pain. She is found to have a R hip fracture. She was hospitalized for further management.  Reason for Visit: Right hip fracture  Consultants: Orthopedics  Procedures: None  Antibiotics: None  Subjective/Interval History: Patient denies any significant pain in the right hip at this time. Denies any nausea, vomiting. No chest pain or shortness of breath. Husband is at the bedside.  ROS: Denies any headaches  Objective:  Vital Signs  Vitals:   02/02/17 1021 02/02/17 1350 02/02/17 2212 02/03/17 0542  BP:  (!) 157/58 (!) 140/55 (!) 156/67  Pulse:  71 (!) 48 (!) 59  Resp:  15 15 16   Temp:  97.9 F (36.6 C) 98.3 F (36.8 C) 98.7 F (37.1 C)  TempSrc:  Oral Oral Oral  SpO2:  100% 95% 97%  Weight: 59 kg (130 lb) 59 kg (130 lb)    Height: 5\' 4"  (1.626 m) 5\' 4"  (1.626 m)      Intake/Output Summary (Last 24 hours) at 02/03/17 1057 Last data filed at 02/03/17 0901  Gross per 24 hour  Intake               80 ml  Output             1140 ml  Net            -1060 ml   Filed Weights   02/02/17 1021 02/02/17 1350  Weight: 59 kg (130 lb) 59 kg (130 lb)    General appearance: alert, cooperative, appears stated age and no distress Resp: clear to auscultation bilaterally Cardio: regular rate and rhythm, S1, S2 normal, no murmur, click, rub or gallop GI: soft, non-tender; bowel sounds normal; no masses,  no organomegaly Extremities: extremities normal, atraumatic, no cyanosis or edema Neurologic: Awake and alert. Oriented 3. No focal neurological deficits.  Lab Results:  Data Reviewed: I have personally reviewed following labs and imaging  studies  CBC:  Recent Labs Lab 02/02/17 1104 02/03/17 0450  WBC 11.1* 8.6  NEUTROABS 8.9*  --   HGB 14.0 12.3  HCT 40.8 36.5  MCV 91.3 92.9  PLT 214 263    Basic Metabolic Panel:  Recent Labs Lab 02/02/17 1104 02/03/17 0450  NA 141 139  K 4.2 4.3  CL 107 109  CO2 27 26  GLUCOSE 99 97  BUN 19 19  CREATININE 0.95 1.00  CALCIUM 9.3 8.7*    GFR: Estimated Creatinine Clearance: 36.2 mL/min (by C-G formula based on SCr of 1 mg/dL).  Coagulation Profile:  Recent Labs Lab 02/02/17 1104  INR 1.05    Recent Results (from the past 240 hour(s))  Surgical PCR screen     Status: None   Collection Time: 02/02/17  2:28 PM  Result Value Ref Range Status   MRSA, PCR NEGATIVE NEGATIVE Final   Staphylococcus aureus NEGATIVE NEGATIVE Final    Comment:        The Xpert SA Assay (FDA approved for NASAL specimens in patients over 46 years of age), is one component of a comprehensive surveillance program.  Test performance has been validated by [facility] for patients greater than or equal to 39 year old. It is not intended  to diagnose infection nor to guide or monitor treatment.       Radiology Studies: Dg Chest Port 1 View  Result Date: 02/02/2017 CLINICAL DATA:  Pre operative respiratory exam.  Right hip fracture. EXAM: PORTABLE CHEST 1 VIEW COMPARISON:  11/23/2013 FINDINGS: Heart size and pulmonary vascularity are normal. Large chronic hiatal hernia. Lungs are clear. IMPRESSION: Large chronic hiatal hernia.  No acute abnormality. Electronically Signed   By: Lorriane Shire M.D.   On: 02/02/2017 11:14   Dg Hip Unilat W Or Wo Pelvis 2-3 Views Right  Result Date: 02/02/2017 CLINICAL DATA:  Right hip pain secondary to a fall at home this morning. EXAM: DG HIP (WITH OR WITHOUT PELVIS) 2-3V RIGHT COMPARISON:  Radiographs dated 06/21/2012 and 06/20/2012 FINDINGS: There is a slightly impacted subcapital fracture of the right femoral neck. Pelvic bones are intact. Proximal  left femoral prosthesis in place. IMPRESSION: Right femoral neck fracture. Electronically Signed   By: Lorriane Shire M.D.   On: 02/02/2017 10:46     Medications:  Scheduled: . [MAR Hold] calcium-vitamin D  1 tablet Oral Q breakfast  . ceFAZolin      .  ceFAZolin (ANCEF) IV  2 g Intravenous On Call to OR  . chlorhexidine  60 mL Topical Once  . dexamethasone  8 mg Intravenous Once  . [MAR Hold] donepezil  10 mg Oral QPM  . [MAR Hold] heparin  5,000 Units Subcutaneous Q8H  . povidone-iodine  2 application Topical Once  . [MAR Hold] simvastatin  20 mg Oral Daily  . [MAR Hold] sodium chloride  500 mL Intravenous Once   Continuous: . sodium chloride Stopped (02/03/17 1038)  . lactated ringers 75 mL/hr at 02/03/17 1039   PRN:[MAR Hold] acetaminophen, [MAR Hold] ibuprofen, [MAR Hold] QUEtiapine  Assessment/Plan:  Principal Problem:   Closed right hip fracture (HCC) Active Problems:   Essential hypertension, benign   Hyperlipidemia   Dementia    Closed right hip fracture  Patient undergo surgery this morning. Pain is reasonably well controlled at this time. Deemed to be low risk for hip replacement by admitting MD.  Essential hypertension, benign Blood pressure noted to be elevated, which could be due to pain. However, at the same time. Pain appears to be reasonably well. She is not noted to be on any medications for the same at home. Continue to monitor for now.  Hyperlipidemia Statin  Dementia Continue Aricept. Apparently when she had her last hip surgery, she did become anxious and restless at night. On PRN Seroquel.  Mild dehydration Gently hydrated.  DVT Prophylaxis: Definitive prophylaxis after surgery    Code Status: DO NOT RESUSCITATE  Family Communication: Discussed with the patient and her husband  Disposition Plan: Await surgery. She'll likely need to go to skilled nursing facility for rehabilitation.   LOS: 1 day   [place]  [facility] Pager [phone] 02/03/2017, 10:57 AM  If 7PM-7AM, please contact night-coverage at [email or web address], password [facility]

## 2017-02-03 NOTE — Anesthesia Procedure Notes (Deleted)
Performed by: Zenna Traister M       

## 2017-02-03 NOTE — Op Note (Signed)
NAME:  Sarah Moyer, Sarah Moyer                ACCOUNT NO.:  [id number]   MEDICAL RECORD NO.: [id number]   LOCATION:  7341                         FACILITY:  [facility]   DATE OF BIRTH:  Sep 09, 2030  PHYSICIAN:  Pietro Cassis. Alvan Dame, M.D.     DATE OF PROCEDURE:  02/03/17                               OPERATIVE REPORT     PREOPERATIVE DIAGNOSIS:  Right displaced femoral neck fracture.   POSTOPERATIVE DIAGNOSIS:  Right displaced femoral neck fracture.   PROCEDURE:  Right hip hemiarthroplasty utilizing DePuy component, size 3 standard Tri-Lock stem with a 69mm unipolar ball with a +0 adapter.   SURGEON:  Pietro Cassis. Alvan Dame, MD   ASSISTANT:  Nehemiah Massed, PA-C.   ANESTHESIA:  General.   SPECIMENS:  None.   DRAINS:  None.   BLOOD LOSS:  About 250 cc.   COMPLICATIONS:  None.   INDICATION OF PROCEDURE:  Sarah Moyer is a pleasant 83 year old female who lives Independently with her husband.  She unfortunately had a fall while at a silver sneakers program at the [facility].  She was admitted to the hospital after radiographs revealed a femoral neck fracture.  She was seen and evaluated and was scheduled for surgery for fixation.  The necessity of surgical repair was discussed with she and her family.  Consent was obtained after reviewing risks of infection, DVT, component failure, and need for revision surgery.  We specifically discussed percutaneous screw fixation verus hemiarthroplasty.   PROCEDURE IN DETAIL:  The patient was brought to the operative theater. Once adequate anesthesia, preoperative antibiotics, 2 g of Ancef administered, the patient was positioned into the left lateral decubitus position with the right side up.  The right lower extremity was then prepped and draped in sterile fashion.  A time-out was performed identifying the patient, planned procedure, and extremity.   A lateral incision was made off the proximal trochanter. Sharp dissection was carried down to the iliotibial band and  gluteal fascia. The gluteal fascia was then incised for posterior approach.  The short external rotators were taken down separate from the posterior capsule. An L capsulotomy was made preserving the posterior leaflet for later anatomic repair. Fracture site was identified and after removing comminuted segments of the posterior femoral neck, the femoral head was removed without difficulty and measured on the back table  using the sizing rings and determined to be 47 mm in diameter.   The proximal femur was then exposed.  Retractors placed.  I then drilled, opened the proximal femur.  Then I hand reamed once and  Irrigated the canal to try to prevent fat emboli.  I began broaching the femur with a starter broach up to a size 3 broach with good medial and lateral metaphyseal fit without evidence of any torsion or movement.  A trial reduction was carried out with a standard offset neck and a +0 adapter with a 36mm ball.  The hip reduced nicely.  The leg lengths appeared to be equal compared to the down leg.   The hip went through a range of motion without evidence of any subluxation or impingement.   Given these findings, the trial components removed.  The  final 3 standard  Tri-Lock stem was opened.  After irrigating the canal, the final stem was impacted and sat at the level where the broach was. Based on this and the trial reduction, a +0 adapter was opened and impacted in the 65mm unipolar ball onto a clean and dry trunnion.  The hip had been irrigated throughout the case and again at this point.  I re- Approximated the posterior capsule to the superior leaflet using a  #1 Vicryl.  The remainder of the wound was closed with #1 Vicryl and #0 V-lock sutures in the iliotibial band and gluteal fascia, a  2-0 Vicryl in the sub-Q tissue and a running 4-0 Monocryl in the skin.  The hip was cleaned, dried, and dressed sterilely using Dermabond and Aquacel dressing.  She was then brought to  recovery room, extubated in stable condition, tolerating the procedure well.  Nehemiah Massed, PA-C was present and utilized as Environmental consultant for the entire case from  Preoperative positioning to management of the contralateral extremity and retractors to  General facilitation of the procedure.  He was also involved with primary wound closure.         Pietro Cassis Alvan Dame, M.D.

## 2017-02-03 NOTE — Anesthesia Preprocedure Evaluation (Signed)
Anesthesia Evaluation  Patient identified by MRN, date of birth, ID band Patient awake    Reviewed: Allergy & Precautions, H&P , NPO status , Patient's Chart, lab work & pertinent test results  Airway Mallampati: II   Neck ROM: full    Dental   Pulmonary shortness of breath,    breath sounds clear to auscultation       Cardiovascular hypertension,  Rhythm:regular Rate:Normal     Neuro/Psych  Neuromuscular disease    GI/Hepatic hiatal hernia,   Endo/Other    Renal/GU      Musculoskeletal  (+) Arthritis ,   Abdominal   Peds  Hematology   Anesthesia Other Findings   Reproductive/Obstetrics                             Anesthesia Physical Anesthesia Plan  ASA: II  Anesthesia Plan: General   Post-op Pain Management:    Induction: Intravenous  Airway Management Planned: Oral ETT  Additional Equipment:   Intra-op Plan:   Post-operative Plan: Extubation in OR  Informed Consent: I have reviewed the patients History and Physical, chart, labs and discussed the procedure including the risks, benefits and alternatives for the proposed anesthesia with the patient or authorized representative who has indicated his/her understanding and acceptance.     Plan Discussed with: CRNA, Anesthesiologist and Surgeon  Anesthesia Plan Comments:         Anesthesia Quick Evaluation

## 2017-02-03 NOTE — Consult Note (Signed)
Reason for Consult: Right hip fracture Referring Physician: [place] Pink, MD  Sarah Moyer is an 83 y.o. female.  HPI: 83 yo female tripped and fell while at the [facility] at silver sneakers.  She had broken her left hip 5 years ago and I had performed and hemiarthroplasty.  She presented to the ER with right hip pain and inability to bear weight.  At time of evaluation she only complained of her right hip pain, no upper extremity injuries.   She recently had some left hip pain but it resolved spontaneously  Past Medical History:  Diagnosis Date  . Arrhythmia    left BBB,    PVC'S  . Cancer ([place]) 10/2013   LEFT BREAST CANCER--LUMPECTOMY  . DDD (degenerative disc disease)   . Diverticular disease    With colonic polyps  . Hiatal hernia   . Hyperlipidemia   . Hypertension     no meds    dr Sarah Moyer  . Osteopenia     Past Surgical History:  Procedure Laterality Date  . APPENDECTOMY    . BREAST LUMPECTOMY WITH NEEDLE LOCALIZATION Left 11/25/2013   Procedure: LEFT BREAST WIRE GUIDED LUMPECTOMY;  Surgeon: Sarah Bookbinder, MD;  Location: [place];  Service: General;  Laterality: Left;  . BREAST SURGERY    . CATARACT EXTRACTION    . HIP ARTHROPLASTY  06/21/2012   Procedure: ARTHROPLASTY BIPOLAR HIP;  Surgeon: Sarah Pole, MD;  Location: [facility];  Service: Orthopedics;  Laterality: Left;    Family History  Problem Relation Age of Onset  . Heart Problems Mother     Died age 10, no details/Died age 60, no details  . CAD Brother 70  . CAD Brother 17    Social History:  reports that she has never smoked. She has never used smokeless tobacco. She reports that she does not drink alcohol or use drugs.  Allergies: No Known Allergies  Medications: I have reviewed the patient's current medications.  Results for orders placed or performed during the hospital encounter of 02/02/17 (from the past 24 hour(s))  CBC with Differential/Platelet     Status: Abnormal   Collection Time: 02/02/17 11:04 AM   Result Value Ref Range   WBC 11.1 (H) 4.0 - 10.5 K/uL   RBC 4.47 3.87 - 5.11 MIL/uL   Hemoglobin 14.0 12.0 - 15.0 g/dL   HCT 40.8 36.0 - 46.0 %   MCV 91.3 78.0 - 100.0 fL   MCH 31.3 26.0 - 34.0 pg   MCHC 34.3 30.0 - 36.0 g/dL   RDW 12.7 11.5 - 15.5 %   Platelets 214 150 - 400 K/uL   Neutrophils Relative % 80 %   Neutro Abs 8.9 (H) 1.7 - 7.7 K/uL   Lymphocytes Relative 12 %   Lymphs Abs 1.3 0.7 - 4.0 K/uL   Monocytes Relative 7 %   Monocytes Absolute 0.8 0.1 - 1.0 K/uL   Eosinophils Relative 1 %   Eosinophils Absolute 0.1 0.0 - 0.7 K/uL   Basophils Relative 0 %   Basophils Absolute 0.0 0.0 - 0.1 K/uL  Basic metabolic panel     Status: Abnormal   Collection Time: 02/02/17 11:04 AM  Result Value Ref Range   Sodium 141 135 - 145 mmol/L   Potassium 4.2 3.5 - 5.1 mmol/L   Chloride 107 101 - 111 mmol/L   CO2 27 22 - 32 mmol/L   Glucose, Bld 99 65 - 99 mg/dL   BUN 19 6 - 20 mg/dL  Creatinine, Ser 0.95 0.44 - 1.00 mg/dL   Calcium 9.3 8.9 - 10.3 mg/dL   GFR calc non Af Amer 53 (L) >60 mL/min   GFR calc Af Amer >60 >60 mL/min   Anion gap 7 5 - 15  Protime-INR     Status: None   Collection Time: 02/02/17 11:04 AM  Result Value Ref Range   Prothrombin Time 13.7 11.4 - 15.2 seconds   INR 1.05   Surgical PCR screen     Status: None   Collection Time: 02/02/17  2:28 PM  Result Value Ref Range   MRSA, PCR NEGATIVE NEGATIVE   Staphylococcus aureus NEGATIVE NEGATIVE  Basic metabolic panel     Status: Abnormal   Collection Time: 02/03/17  4:50 AM  Result Value Ref Range   Sodium 139 135 - 145 mmol/L   Potassium 4.3 3.5 - 5.1 mmol/L   Chloride 109 101 - 111 mmol/L   CO2 26 22 - 32 mmol/L   Glucose, Bld 97 65 - 99 mg/dL   BUN 19 6 - 20 mg/dL   Creatinine, Ser 1.00 0.44 - 1.00 mg/dL   Calcium 8.7 (L) 8.9 - 10.3 mg/dL   GFR calc non Af Amer 50 (L) >60 mL/min   GFR calc Af Amer 58 (L) >60 mL/min   Anion gap 4 (L) 5 - 15  CBC     Status: None   Collection Time: 02/03/17  4:50 AM   Result Value Ref Range   WBC 8.6 4.0 - 10.5 K/uL   RBC 3.93 3.87 - 5.11 MIL/uL   Hemoglobin 12.3 12.0 - 15.0 g/dL   HCT 36.5 36.0 - 46.0 %   MCV 92.9 78.0 - 100.0 fL   MCH 31.3 26.0 - 34.0 pg   MCHC 33.7 30.0 - 36.0 g/dL   RDW 12.7 11.5 - 15.5 %   Platelets 180 150 - 400 K/uL    X-ray: CLINICAL DATA:  Right hip pain secondary to a fall at home this morning.  EXAM: DG HIP (WITH OR WITHOUT PELVIS) 2-3V RIGHT  COMPARISON:  Radiographs dated 06/21/2012 and 06/20/2012  FINDINGS: There is a slightly impacted subcapital fracture of the right femoral neck.  Pelvic bones are intact.  Proximal left femoral prosthesis in place.  IMPRESSION: Right femoral neck fracture.   Electronically Signed   By: Sarah Moyer M.D.  ROS: Review of Systems  Respiratory: Negative for shortness of breath.   Cardiovascular: Negative for chest pain.  Gastrointestinal: Negative for abdominal pain.  Neurological: Negative for headaches.  All other systems are reviewed and are negative for acute change except as noted in the HPI  Blood pressure (!) 156/67, pulse (!) 59, temperature 98.7 F (37.1 C), temperature source Oral, resp. rate 16, height 5\' 4"  (1.626 m), weight 59 kg (130 lb), SpO2 97 %.  Physical Exam  Awake alert Some difficulty hearing Daughter and husband in room RLE slight short with external rotation, NVI Left hip normal  Both UE normal, ROM, no pain  General medical exam reviewed for pertinent findings  Assessment/Plan: Right femoral neck (hip) fracture  I had a discussion with Sarah Moyer and her family options for treatment based on the radiographic appearance of her fracture.  After reviewing risks and benefits, pros, and cons of hip screw fixation versus doing what we had done 5 years ago (hemiarthroplasty) we decided that a single operation with a lower risk of re-operation risk seemed to be the best option  Consent ordered NPO for  OR today Ancef on call  to the OR  Post op course reviewed  Sarah Moyer D 02/03/2017, 10:10 AM

## 2017-02-04 LAB — BASIC METABOLIC PANEL
ANION GAP: 6 (ref 5–15)
BUN: 17 mg/dL (ref 6–20)
CHLORIDE: 107 mmol/L (ref 101–111)
CO2: 24 mmol/L (ref 22–32)
Calcium: 8.5 mg/dL — ABNORMAL LOW (ref 8.9–10.3)
Creatinine, Ser: 0.88 mg/dL (ref 0.44–1.00)
GFR calc Af Amer: >60 mL/min (ref >60)
GFR, EST NON AFRICAN AMERICAN: 59 mL/min — AB (ref >60)
GLUCOSE: 138 mg/dL — AB (ref 65–99)
POTASSIUM: 4.4 mmol/L (ref 3.5–5.1)
Sodium: 137 mmol/L (ref 135–145)

## 2017-02-04 LAB — CBC
HCT: 33.4 % — ABNORMAL LOW (ref 36.0–46.0)
Hemoglobin: 11.6 g/dL — ABNORMAL LOW (ref 12.0–15.0)
MCH: 31.8 pg (ref 26.0–34.0)
MCHC: 34.7 g/dL (ref 30.0–36.0)
MCV: 91.5 fL (ref 78.0–100.0)
PLATELETS: 181 10*3/uL (ref 150–400)
RBC: 3.65 MIL/uL — AB (ref 3.87–5.11)
RDW: 12.5 % (ref 11.5–15.5)
WBC: 10.8 10*3/uL — ABNORMAL HIGH (ref 4.0–10.5)

## 2017-02-04 MED ORDER — ASPIRIN EC 81 MG PO TBEC
81.0000 mg | DELAYED_RELEASE_TABLET | Freq: Two times a day (BID) | ORAL | Status: DC
  Administered 2017-02-04 – 2017-02-05 (×3): 81 mg via ORAL
  Filled 2017-02-04 (×5): qty 1

## 2017-02-04 MED ORDER — ASPIRIN 81 MG PO CHEW: 81.0000 mg | CHEWABLE_TABLET | Freq: Two times a day (BID) | ORAL | 0 refills | Status: DC

## 2017-02-04 MED ORDER — HYDROCODONE-ACETAMINOPHEN 5-325 MG PO TABS: 1.0000 | ORAL_TABLET | Freq: Four times a day (QID) | ORAL | 0 refills | Status: DC | PRN

## 2017-02-04 MED ORDER — FERROUS SULFATE 325 (65 FE) MG PO TABS: 325.0000 mg | ORAL_TABLET | Freq: Three times a day (TID) | ORAL | 3 refills | Status: DC

## 2017-02-04 NOTE — Clinical Social Work Placement (Deleted)
   CLINICAL SOCIAL WORK PLACEMENT  NOTE  Date:  02/04/2017  Patient Details  Name: LATANIA BASCOMB MRN: [id number] Date of Birth: Jan 04, 1930  Clinical Social Work is seeking post-discharge placement for this patient at the [place] level of care (*CSW will initial, date and re-position this form in  chart as items are completed):  Yes   Patient/family provided with [place] Work Department's list of facilities offering this level of care within the geographic area requested by the patient (or if unable, by the patient's family).  Yes   Patient/family informed of their freedom to choose among providers that offer the needed level of care, that participate in Medicare, Medicaid or managed care program needed by the patient, have an available bed and are willing to accept the patient.  Yes   Patient/family informed of [facility]'s ownership interest in [facility] and [facility], as well as of the fact that they are under no obligation to receive care at these facilities.  PASRR submitted to EDS on       PASRR number received on       Existing PASRR number confirmed on 02/04/17 ([id number])     FL2 transmitted to all facilities in geographic area requested by pt/family on       FL2 transmitted to all facilities within larger geographic area on 02/04/17     Patient informed that his/her managed care company has contracts with or will negotiate with certain facilities, including the following:            Patient/family informed of bed offers received.  Patient chooses bed at       Physician recommends and patient chooses bed at      Patient to be transferred to   on  .  Patient to be transferred to facility by       Patient family notified on   of transfer.  Name of family member notified:        PHYSICIAN       Additional Comment:    _______________________________________________ Amador Cunas, LCSW 02/04/2017,  12:25 PM

## 2017-02-04 NOTE — Evaluation (Signed)
Physical Therapy Evaluation Patient Details Name: Sarah Moyer MRN: [id number] DOB: Apr 22, 1930 Today's Date: 02/04/2017   History of Present Illness  s/p R  hemiarthroplasty , posterior approach due to femoral neck fx. PMH:  breast CA, dementia, L THA 5 years ago  Clinical Impression  The patient  Assisted from recliner to bed. Noted to have  Weight on the right leg. Should progress to ambulating short distances. Pt admitted with above diagnosis. Pt currently with functional limitations due to the deficits listed below (see PT Problem List).  Pt will benefit from skilled PT to increase their independence and safety with mobility to allow discharge to the venue listed below.       Follow Up Recommendations SNF;Supervision/Assistance - 24 hour    Equipment Recommendations  None recommended by PT    Recommendations for Other Services       Precautions / Restrictions Precautions Precautions: Posterior Hip;Fall Restrictions Other Position/Activity Restrictions: WBAT      Mobility  Bed Mobility   Bed Mobility: Sit to Supine       Sit to supine: Mod assist   General bed mobility comments: assist for legs and trunk; cues for sequence  Transfers Overall transfer level: Needs assistance Equipment used: Rolling walker (2 wheeled) Transfers: Sit to/from [facility] Sit to Stand: Mod assist;+2 physical assistance;+2 safety/equipment Stand pivot transfers: Mod assist;+2 physical assistance;+2 safety/equipment       General transfer comment: assist to rise and stabilize. Cues for UE/LE placement and assist to keep RLE extended when sitting.  Cues to weight shift forward when standing  Ambulation/Gait                Stairs            Wheelchair Mobility    Modified Rankin (Stroke Patients Only)       Balance                                             Pertinent Vitals/Pain Faces Pain Scale: Hurts even more Pain  Location: Rt hip Pain Descriptors / Indicators: Sore;Spasm Pain Intervention(s): Patient requesting pain meds-RN notified    Home Living Family/patient expects to be discharged to:: Skilled nursing facility Living Arrangements: Spouse/significant other                    Prior Function Level of Independence: Independent               Hand Dominance        Extremity/Trunk Assessment   Upper Extremity Assessment Upper Extremity Assessment: Defer to OT evaluation    Lower Extremity Assessment Lower Extremity Assessment: RLE deficits/detail RLE Deficits / Details: needs assistance to lift leg.       Communication   Communication: No difficulties  Cognition Arousal/Alertness: Awake/alert Behavior During Therapy: WFL for tasks assessed/performed                                   General Comments: per chart, h/o dementia.  WFLs for this session      General Comments      Exercises     Assessment/Plan    PT Assessment Patient needs continued PT services  PT Problem List Decreased strength;Decreased range of motion;Decreased activity tolerance;Decreased balance;Decreased mobility;Decreased knowledge of precautions;Decreased  safety awareness;Decreased knowledge of use of DME;Pain       PT Treatment Interventions DME instruction;Gait training;Functional mobility training;Therapeutic activities;Therapeutic exercise;Patient/family education    PT Goals (Current goals can be found in the Care Plan section)  Acute Rehab PT Goals Patient Stated Goal: get back to independence. [facility] [employer] PT Goal Formulation: With patient/family Time For Goal Achievement: 02/18/17 Potential to Achieve Goals: Good    Frequency Min 3X/week   Barriers to discharge        Co-evaluation               End of Session   Activity Tolerance: Patient tolerated treatment well Patient left: in bed;with bed alarm set;with  family/visitor present Nurse Communication: Mobility status PT Visit Diagnosis: Unsteadiness on feet (R26.81);Difficulty in walking, not elsewhere classified (R26.2);Pain Pain - Right/Left: Right Pain - part of body: Hip    Time: 1342-1406 PT Time Calculation (min) (ACUTE ONLY): 24 min   Charges:   PT Evaluation $PT Eval Low Complexity: 1 Procedure PT Treatments $Therapeutic Activity: 8-22 mins   PT G CodesTresa Moyer PT [phone]   Sarah Moyer 02/04/2017, 6:06 PM

## 2017-02-04 NOTE — Progress Notes (Addendum)
[facility] PROGRESS NOTE  Sarah Moyer OEV:[id number] DOB: Nov 19, 1929 DOA: 02/02/2017  PCP: Tivis Ringer, MD  Brief History/Interval Summary: 83 y.o. female with medical history of L breast CA s/p treatment, HTN, dementia who was exercising at a Silver [employer]neakers class at the [facility] when she fell and developed R hip pain. She is found to have a R hip fracture. She was hospitalized for further management.  Reason for Visit: Right hip fracture  Consultants: Orthopedics  Procedures:  Right hip hemiarthroplasty 4/10  Antibiotics: None  Subjective/Interval History: Patient was transferred from bed to chair. She got a little short of breath with this activity. Doing better after a few minutes. States that pain is reasonably well controlled. Symptoms muscle spasm in his care. Denies any chest pain.   ROS: Denies any headaches  Objective:  Vital Signs  Vitals:   02/03/17 2240 02/04/17 0227 02/04/17 0644 02/04/17 0939  BP: (!) 134/51 (!) 110/44 (!) 121/42 (!) 123/48  Pulse: 63 85 (!) 57 (!) 54  Resp: 18 18 18 18   Temp: 98.3 F (36.8 C) 97.9 F (36.6 C) 98.4 F (36.9 C) 97.7 F (36.5 C)  TempSrc: Oral Oral Oral Oral  SpO2: 98% 99% 99% 100%  Weight:      Height:        Intake/Output Summary (Last 24 hours) at 02/04/17 1129 Last data filed at 02/04/17 0913  Gross per 24 hour  Intake             3105 ml  Output             2825 ml  Net              280 ml   Filed Weights   02/02/17 1021 02/02/17 1350  Weight: 59 kg (130 lb) 59 kg (130 lb)    General appearance: alert, cooperative, appears stated age and no distress Resp: clear to auscultation bilaterally Cardio: regular rate and rhythm, S1, S2 normal, no murmur, click, rub or gallop GI: soft, non-tender; bowel sounds normal; no masses,  no organomegaly Extremities: No bruising noted over the right thigh Neurologic: Awake and alert. Oriented 3. No focal neurological deficits.  Lab Results:  Data  Reviewed: I have personally reviewed following labs and imaging studies  CBC:  Recent Labs Lab 02/02/17 1104 02/03/17 0450 02/04/17 0437  WBC 11.1* 8.6 10.8*  NEUTROABS 8.9*  --   --   HGB 14.0 12.3 11.6*  HCT 40.8 36.5 33.4*  MCV 91.3 92.9 91.5  PLT 214 180 829    Basic Metabolic Panel:  Recent Labs Lab 02/02/17 1104 02/03/17 0450 02/04/17 0437  NA 141 139 137  K 4.2 4.3 4.4  CL 107 109 107  CO2 27 26 24   GLUCOSE 99 97 138*  BUN 19 19 17   CREATININE 0.95 1.00 0.88  CALCIUM 9.3 8.7* 8.5*    GFR: Estimated Creatinine Clearance: 41.1 mL/min (by C-G formula based on SCr of 0.88 mg/dL).  Coagulation Profile:  Recent Labs Lab 02/02/17 1104  INR 1.05    Recent Results (from the past 240 hour(s))  Surgical PCR screen     Status: None   Collection Time: 02/02/17  2:28 PM  Result Value Ref Range Status   MRSA, PCR NEGATIVE NEGATIVE Final   Staphylococcus aureus NEGATIVE NEGATIVE Final    Comment:        The Xpert SA Assay (FDA approved for NASAL specimens in patients over 41 years of age), is one  component of a comprehensive surveillance program.  Test performance has been validated by [facility] for patients greater than or equal to 76 year old. It is not intended to diagnose infection nor to guide or monitor treatment.       Radiology Studies: Pelvis Portable  Result Date: 02/03/2017 CLINICAL DATA:  Status post right hip replacement. EXAM: PORTABLE PELVIS 1-2 VIEWS COMPARISON:  June 21, 2012 FINDINGS: The patient is status post right hip replacement in the interval. Acetabular and femoral components are in good position. No other abnormalities. IMPRESSION: Status post right hip replacement as above. Electronically Signed   By: Dorise Bullion III M.D   On: 02/03/2017 13:43     Medications:  Scheduled: . calcium-vitamin D  1 tablet Oral Q breakfast  . donepezil  10 mg Oral QPM  . ferrous sulfate  325 mg Oral TID PC  . heparin  5,000 Units  Subcutaneous Q8H  . simvastatin  20 mg Oral Daily  . sodium chloride  500 mL Intravenous Once   Continuous: . sodium chloride 20 mL/hr at 02/04/17 0600   TJQ:ZESPQZRAQTMAU, HYDROcodone-acetaminophen, menthol-cetylpyridinium **OR** phenol, methocarbamol **OR** methocarbamol (ROBAXIN)  IV, morphine injection, ondansetron **OR** ondansetron (ZOFRAN) IV, QUEtiapine  Assessment/Plan:  Principal Problem:   Closed right hip fracture (HCC) Active Problems:   Essential hypertension, benign   Hyperlipidemia   Dementia    Closed right hip fracture  Patient is status post right hip hemiarthroplasty. Patient is stable from a medical standpoint. Pain control. Started on aspirin for DVT prophylaxis per orthopedics.   Essential hypertension, benign Blood pressure was noted to be elevated initially, most likely due to pain. Now improved. Continue to monitor.  Hyperlipidemia Statin  Dementia Continue Aricept. Apparently when she had her last hip surgery, she did become anxious and restless at night. On PRN Seroquel.  Mild dehydration Gently hydrated.  Normocytic anemia Drop in hemoglobin is likely dilutional. No evidence for overt bleeding.  DVT Prophylaxis: Aspirin   Code Status: DO NOT RESUSCITATE  Family Communication: Discussed with the patient and her husband  Disposition Plan: Seems to be medically stable. PT evaluation today. Anticipate discharge to SNF in the next 1-2 days.   LOS: 2 days   [place] [facility] Pager [phone] 02/04/2017, 11:29 AM  If 7PM-7AM, please contact night-coverage at [email or web address], password [facility]

## 2017-02-04 NOTE — NC FL2 (Signed)
[place] LEVEL OF CARE SCREENING TOOL     IDENTIFICATION  Patient Name: Sarah Moyer Birthdate: [birth year] Sex: female Admission Date (Current Location): 02/02/2017  [facility] and [place] Number:  Herbalist and Address:  [facility],  [place] [address], [place]      Provider Number: [id number]  Attending Physician Name and Address:  Bonnielee Haff, MD  Relative Name and Phone Number:       Current Level of Care: Hospital Recommended Level of Care: [place] Prior Approval Number:    Date Approved/Denied:   PASRR Number: [id number]  Discharge Plan: SNF    Current Diagnoses: Patient Active Problem List   Diagnosis Date Noted  . Closed right hip fracture ([place]) 02/02/2017  . Dementia 02/02/2017  . Dizziness 02/15/2014  . Breast cancer of upper-outer quadrant of left female breast ([place]) 11/09/2013  . Hyperlipidemia 06/20/2012  . Osteopenia 06/20/2012  . Leukocytosis 06/20/2012  . Fracture of femoral neck, left, closed ([place]) 06/20/2012  . Essential hypertension, benign 09/12/2010  . DYSPNEA 09/12/2010  . ELECTROCARDIOGRAM, ABNORMAL 09/12/2010    Orientation RESPIRATION BLADDER Height & Weight     Self, Time, Situation, Place  O2 Continent Weight: 130 lb (59 kg) Height:  5\' 4"  (162.6 cm)  BEHAVIORAL SYMPTOMS/MOOD NEUROLOGICAL BOWEL NUTRITION STATUS      Continent    AMBULATORY STATUS COMMUNICATION OF NEEDS Skin   Extensive Assist Verbally Surgical wounds                       Personal Care Assistance Level of Assistance  Bathing, Feeding, Dressing Bathing Assistance: Limited assistance Feeding assistance: Limited assistance Dressing Assistance: Maximum assistance     Functional Limitations Info  Hearing, Sight, Speech Sight Info: Adequate Hearing Info: Impaired Speech Info: Adequate    SPECIAL CARE FACTORS FREQUENCY  PT (By licensed PT), OT (By licensed OT)                     Contractures Contractures Info: Not present    Additional Factors Info  Code Status Code Status Info: DNR             Current Medications (02/04/2017):  This is the current hospital active medication list Current Facility-Administered Medications  Medication Dose Route Frequency Provider Last Rate Last Dose  . 0.9 %  sodium chloride infusion   Intravenous Continuous Debbe Odea, MD 20 mL/hr at 02/04/17 0600    . acetaminophen (TYLENOL) tablet 650 mg  650 mg Oral Q4H PRN Debbe Odea, MD   650 mg at 02/03/17 0333  . calcium-vitamin D (OSCAL WITH D) 500-200 MG-UNIT per tablet 1 tablet  1 tablet Oral Q breakfast Debbe Odea, MD   1 tablet at 02/04/17 0800  . donepezil (ARICEPT) tablet 10 mg  10 mg Oral QPM Debbe Odea, MD   10 mg at 02/03/17 1756  . ferrous sulfate tablet 325 mg  325 mg Oral TID PC Danae Orleans, PA-C      . heparin injection 5,000 Units  5,000 Units Subcutaneous Q8H Debbe Odea, MD   5,000 Units at 02/04/17 0708  . HYDROcodone-acetaminophen (NORCO/VICODIN) 5-325 MG per tablet 1-2 tablet  1-2 tablet Oral Q6H PRN Danae Orleans, PA-C   2 tablet at 02/04/17 0800  . menthol-cetylpyridinium (CEPACOL) lozenge 3 mg  1 lozenge Oral PRN Danae Orleans, PA-C       Or  . phenol (CHLORASEPTIC) mouth spray 1 spray  1 spray Mouth/Throat PRN Danae Orleans, PA-C      . methocarbamol (ROBAXIN) tablet 500 mg  500 mg Oral Q6H PRN Danae Orleans, PA-C       Or  . methocarbamol (ROBAXIN) 500 mg in dextrose 5 % 50 mL IVPB  500 mg Intravenous Q6H PRN Danae Orleans, PA-C   500 mg at 02/03/17 1318  . morphine 2 MG/ML injection 0.5 mg  0.5 mg Intravenous Q2H PRN Danae Orleans, PA-C      . ondansetron [facility]) tablet 4 mg  4 mg Oral Q6H PRN Danae Orleans, PA-C       Or  . ondansetron [facility]) injection 4 mg  4 mg Intravenous Q6H PRN Danae Orleans, PA-C      . QUEtiapine (SEROQUEL) tablet 25 mg  25 mg Oral QHS PRN Debbe Odea, MD      . simvastatin (ZOCOR) tablet 20 mg  20 mg Oral  Daily Debbe Odea, MD   20 mg at 02/04/17 0800  . sodium chloride 0.9 % bolus 500 mL  500 mL Intravenous Once Fatima Blank, MD         Discharge Medications: Please see discharge summary for a list of discharge medications.  Relevant Imaging Results:  Relevant Lab Results:   Additional Information SS#: [id number]  Amador Cunas, [place]

## 2017-02-04 NOTE — Progress Notes (Signed)
LCSW met with pt and pt's husband to provide current SNF offers. Pt has accepted offer from Sarah Moyer. Spoke with Marianna Fuss at [facility] who confirmed they have initiated prior auth with [facility] LCSW will follow.   Wandra Feinstein, MSW, LCSW

## 2017-02-04 NOTE — Evaluation (Signed)
Occupational Therapy Evaluation Patient Details Name: Sarah Moyer MRN: [id number] DOB: 07/19/1930 Today's Date: 02/04/2017    History of Present Illness s/p R THA, posterior approach due to femoral neck fx. PMH:  breast CA, dementia, L THA 5 years ago   Clinical Impression   This 83 year old female was admitted for the above.  She was independent and active prior to admission.  Currently she needs mod +2 assistance for transfers and sit to stand and up to max +2 assistance for LB adls. She will benefit from continued OT in acute as well as follow up therapy at SNF    Follow Up Recommendations  SNF    Equipment Recommendations  3 in 1 bedside commode (if she doesn't have this)    Recommendations for Other Services       Precautions / Restrictions Precautions Precautions: Posterior Hip;Fall Restrictions Weight Bearing Restrictions: No Other Position/Activity Restrictions: WBAT      Mobility Bed Mobility Overal bed mobility: Needs Assistance Bed Mobility: Supine to Sit     Supine to sit: Max assist     General bed mobility comments: assist for legs and trunk; cues for sequence  Transfers Overall transfer level: Needs assistance Equipment used: Rolling walker (2 wheeled) Transfers: Sit to/from [facility] Sit to Stand: Mod assist;+2 physical assistance;+2 safety/equipment Stand pivot transfers: Mod assist;+2 physical assistance;+2 safety/equipment       General transfer comment: assist to rise and stabilize. Cues for UE/LE placement and assist to keep RLE extended when sitting.  Cues to weight shift forward when standing    Balance                                           ADL either performed or assessed with clinical judgement   ADL Overall ADL's : Needs assistance/impaired Eating/Feeding: Independent   Grooming: Set up;Brushing hair;Sitting   Upper Body Bathing: Set up;Sitting   Lower Body Bathing: Moderate  assistance;Sit to/from stand;+2 for physical assistance;+2 for safety/equipment   Upper Body Dressing : Minimal assistance;Sitting   Lower Body Dressing: Maximal assistance;+2 for physical assistance;Sit to/from stand;Adhering to hip precautions   Toilet Transfer: Moderate assistance;+2 for safety/equipment;+2 for physical assistance;Stand-pivot;RW Toilet Transfer Details (indicate cue type and reason): to chair           General ADL Comments: pt has AE at home.  She felt a little dizzy:  BP WFLs, sats 98-100% although WOB increased.  Reviewed THPs and ADLs. Pt had catheter in place at time of evaluation     Vision         Perception     Praxis      Pertinent Vitals/Pain Pain Assessment: Faces Faces Pain Scale: Hurts little more Pain Location: L hip Pain Descriptors / Indicators: Sore;Spasm Pain Intervention(s): Limited activity within patient's tolerance;Monitored during session;Premedicated before session;Repositioned;Patient requesting pain meds-RN notified     Hand Dominance     Extremity/Trunk Assessment Upper Extremity Assessment Upper Extremity Assessment: Generalized weakness           Communication     Cognition Arousal/Alertness: Awake/alert Behavior During Therapy: WFL for tasks assessed/performed                                   General Comments: per chart, h/o dementia.  WFLs for  this session   General Comments       Exercises     Shoulder Instructions      Home Living Family/patient expects to be discharged to:: Unsure Living Arrangements: Spouse/significant other                                      Prior Functioning/Environment                   OT Problem List: Decreased strength;Impaired balance (sitting and/or standing);Decreased activity tolerance;Decreased knowledge of use of DME or AE;Decreased knowledge of precautions;Pain      OT Treatment/Interventions: Self-care/ADL training;DME and/or  AE instruction;Patient/family education;Balance training    OT Goals(Current goals can be found in the care plan section) Acute Rehab OT Goals Patient Stated Goal: get back to independence. Pt exercises at [facility] with silver sneakers OT Goal Formulation: With patient/family Time For Goal Achievement: 02/11/17 Potential to Achieve Goals: Good ADL Goals Pt Will Perform Lower Body Bathing: with min assist;with adaptive equipment;sit to/from stand Pt Will Perform Lower Body Dressing: with min assist;with adaptive equipment;sit to/from stand Pt Will Transfer to Toilet: with min assist;bedside commode;stand pivot transfer Pt Will Perform Toileting - Clothing Manipulation and hygiene: with min assist;sit to/from stand Additional ADL Goal #1: pt will verbalize 3/3 posterior hip precautions  OT Frequency: Min 2X/week   Barriers to D/C:            Co-evaluation              End of Session    Activity Tolerance: Patient limited by fatigue Patient left: in chair;with call bell/phone within reach;with chair alarm set;with family/visitor present  OT Visit Diagnosis: Unsteadiness on feet (R26.81);Pain Pain - Right/Left: Right Pain - part of body: Hip                Time: 1003-1036 OT Time Calculation (min): 33 min Charges:  OT General Charges $OT Visit: 1 Procedure OT Evaluation $OT Eval Moderate Complexity: 1 Procedure OT Treatments $Therapeutic Activity: 8-22 mins G-Codes:     [place], OTR/L [phone] 02/04/2017  Sarah Moyer 02/04/2017, 10:59 AM

## 2017-02-04 NOTE — Clinical Social Work Note (Signed)
Clinical Social Work Assessment  Patient Details  Name: Sarah Moyer MRN: [id number] Date of Birth: Apr 16, 1930  Date of referral:  02/04/17               Reason for consult:  Facility Placement                Permission sought to share information with:  Facility Sport and exercise psychologist, Family Supports Permission granted to share information::  Yes, Verbal Permission Granted  Name::     Husband/Sarah Moyer Daughter/Sarah Moyer  Agency::  [facility]  Relationship::     Contact Information:     Housing/Transportation Living arrangements for the past 2 months:  Single Family Home Source of Information:  Patient, Spouse, Adult Children Patient Interpreter Needed:  None Criminal Activity/Legal Involvement Pertinent to Current Situation/Hospitalization:  No - Comment as needed Significant Relationships:  Adult Children, Spouse Lives with:  Spouse Do you feel safe going back to the place where you live?  Yes Need for family participation in patient care:  Yes (Comment)  Care giving concerns:     Facilities manager / plan:  LCSW met with pt, pt's spouse, and pt's dtr at bedside re anticipated need for SNF. Per OT, pt will need SNF for rehab. PT currently pending. Pt admitted from home with her husband and has supportive children who assist as needed. Pt with previous SNF stay at [facility] after a hip fracture and prefers this facility again. LCSW reviewed placement process and answered questions. Pt's insurance will require prior auth for SNF. Once pt has accepted an offer, facility will obtain auth. Will f/u with offers once available.   Employment status:  Retired Nurse, adult PT Recommendations:  [place] / Referral to community resources:  [place]  Patient/Family's Response to care:    Patient/Family's Understanding of and Emotional Response to Diagnosis, Current Treatment, and Prognosis:    Emotional Assessment Appearance:  Well-Groomed, Appears stated age Attitude/Demeanor/Rapport:    Affect (typically observed):  Hopeful, Accepting, Adaptable, Appropriate Orientation:  Oriented to Self, Oriented to Place, Oriented to  Time, Oriented to Situation Alcohol / Substance use:  Never Used Psych involvement (Current and /or in the community):  No (Comment)  Discharge Needs  Concerns to be addressed:  Discharge Planning Concerns Readmission within the last 30 days:  No Current discharge risk:    Barriers to Discharge:  [place], [place], [place] 02/04/2017, 12:21 PM

## 2017-02-04 NOTE — Clinical Social Work Placement (Addendum)
   CLINICAL SOCIAL WORK PLACEMENT  NOTE  Date:  02/04/2017  Patient Details  Name: Sarah Moyer MRN: [id number] Date of Birth: 06-Aug-1930  Clinical Social Work is seeking post-discharge placement for this patient at the [place] level of care (*CSW will initial, date and re-position this form in  chart as items are completed):  Yes   Patient/family provided with [place] Work Department's list of facilities offering this level of care within the geographic area requested by the patient (or if unable, by the patient's family).  Yes   Patient/family informed of their freedom to choose among providers that offer the needed level of care, that participate in Medicare, Medicaid or managed care program needed by the patient, have an available bed and are willing to accept the patient.  Yes   Patient/family informed of [place]'s ownership interest in [facility] and [facility], as well as of the fact that they are under no obligation to receive care at these facilities.  PASRR submitted to EDS on       PASRR number received on       Existing PASRR number confirmed on 02/04/17 ([id number])     FL2 transmitted to all facilities in geographic area requested by pt/family on       FL2 transmitted to all facilities within larger geographic area on 02/04/17     Patient informed that his/her managed care company has contracts with or will negotiate with certain facilities, including the following:        Yes   Patient/family informed of bed offers received.  Patient chooses bed at [facility]     Physician recommends and patient chooses bed at      Patient to be transferred to  4/12 on  .  Patient to be transferred to facility by  [facility]     Patient family notified on  4/12 of transfer.  Name of family member notified:   Spouse at Bedside.     PHYSICIAN       Additional Comment:     _______________________________________________ Amador Cunas, LCSW 02/04/2017, 2:46 PM

## 2017-02-04 NOTE — Anesthesia Postprocedure Evaluation (Signed)
Anesthesia Post Note  Patient: Sarah Moyer  Procedure(s) Performed: Procedure(s) (LRB): ARTHROPLASTY BIPOLAR HIP (HEMIARTHROPLASTY) RIGHT (Right)  Patient location during evaluation: PACU Anesthesia Type: General Level of consciousness: awake and alert and patient cooperative Pain management: pain level controlled Vital Signs Assessment: post-procedure vital signs reviewed and stable Respiratory status: spontaneous breathing and respiratory function stable Cardiovascular status: stable Anesthetic complications: no       Last Vitals:  Vitals:   02/04/17 0644 02/04/17 0939  BP: (!) 121/42 (!) 123/48  Pulse: (!) 57 (!) 54  Resp: 18 18  Temp: 36.9 C 36.5 C    Last Pain:  Vitals:   02/04/17 0939  TempSrc: Oral  PainSc:                  [name]

## 2017-02-04 NOTE — Progress Notes (Signed)
     Subjective: 1 Day Post-Op Procedure(s) (LRB): ARTHROPLASTY BIPOLAR HIP (HEMIARTHROPLASTY) RIGHT (Right)   Patient reports pain as mild, pain controlled.  Feels much better as compared to prior to surgery. No reported events throughout the night.   Objective:   VITALS:   Vitals:   02/04/17 0227 02/04/17 0644  BP: (!) 110/44 (!) 121/42  Pulse: 85 (!) 57  Resp: 18 18  Temp: 97.9 F (36.6 C) 98.4 F (36.9 C)    Dorsiflexion/Plantar flexion intact Incision: dressing C/Moyer/I No cellulitis present Compartment soft  LABS  Recent Labs  02/02/17 1104 02/03/17 0450 02/04/17 0437  HGB 14.0 12.3 11.6*  HCT 40.8 36.5 33.4*  WBC 11.1* 8.6 10.8*  PLT 214 180 181     Recent Labs  02/02/17 1104 02/03/17 0450 02/04/17 0437  NA 141 139 137  K 4.2 4.3 4.4  BUN 19 19 17   CREATININE 0.95 1.00 0.88  GLUCOSE 99 97 138*     Assessment/Plan: 1 Day Post-Op Procedure(s) (LRB): ARTHROPLASTY BIPOLAR HIP (HEMIARTHROPLASTY) RIGHT (Right) Up with therapy Discharge disposition to be determined   Ortho recommendations:  ASA 81 mg bid for 4 weeks for anticoagulation, unless other medically indicated.  Norco for pain management (Rx written).  MiraLax and Colace for constipation  Iron 325 mg tid for 2-3 weeks   WBAT on the right leg.  Dressing to remain in place until follow in clinic in 2 weeks.  Dressing is waterproof and may shower with it in place.  Follow up in 2 weeks at [facility]. Follow up with OLIN,Sarah Moyer in 2 weeks.  Contact information:  [facility] [address], [address] [place] Sarah Moyer   PAC  02/04/2017, 8:20 AM

## 2017-02-05 ENCOUNTER — Inpatient Hospital Stay (HOSPITAL_COMMUNITY): Payer: Medicare HMO

## 2017-02-05 ENCOUNTER — Inpatient Hospital Stay
Admission: RE | Admit: 2017-02-05 | Discharge: 2017-02-21 | Disposition: A | Discharge status: Home / Self Care | Payer: Medicare HMO | Source: Ambulatory Visit | Attending: Internal Medicine | Admitting: Internal Medicine

## 2017-02-05 DIAGNOSIS — D509 Iron deficiency anemia, unspecified: Secondary | ICD-10-CM | POA: Diagnosis not present

## 2017-02-05 DIAGNOSIS — K449 Diaphragmatic hernia without obstruction or gangrene: Secondary | ICD-10-CM | POA: Diagnosis not present

## 2017-02-05 DIAGNOSIS — M810 Age-related osteoporosis without current pathological fracture: Secondary | ICD-10-CM | POA: Diagnosis not present

## 2017-02-05 DIAGNOSIS — S72001S Fracture of unspecified part of neck of right femur, sequela: Secondary | ICD-10-CM | POA: Diagnosis not present

## 2017-02-05 DIAGNOSIS — R41841 Cognitive communication deficit: Secondary | ICD-10-CM | POA: Diagnosis not present

## 2017-02-05 DIAGNOSIS — F0390 Unspecified dementia without behavioral disturbance: Secondary | ICD-10-CM | POA: Diagnosis not present

## 2017-02-05 DIAGNOSIS — S72002D Fracture of unspecified part of neck of left femur, subsequent encounter for closed fracture with routine healing: Secondary | ICD-10-CM | POA: Diagnosis not present

## 2017-02-05 DIAGNOSIS — M6281 Muscle weakness (generalized): Secondary | ICD-10-CM | POA: Diagnosis not present

## 2017-02-05 DIAGNOSIS — R262 Difficulty in walking, not elsewhere classified: Secondary | ICD-10-CM | POA: Diagnosis not present

## 2017-02-05 DIAGNOSIS — Z96642 Presence of left artificial hip joint: Secondary | ICD-10-CM | POA: Diagnosis not present

## 2017-02-05 DIAGNOSIS — D72829 Elevated white blood cell count, unspecified: Secondary | ICD-10-CM | POA: Diagnosis not present

## 2017-02-05 DIAGNOSIS — R918 Other nonspecific abnormal finding of lung field: Secondary | ICD-10-CM | POA: Diagnosis not present

## 2017-02-05 DIAGNOSIS — S72001A Fracture of unspecified part of neck of right femur, initial encounter for closed fracture: Secondary | ICD-10-CM | POA: Diagnosis not present

## 2017-02-05 DIAGNOSIS — I10 Essential (primary) hypertension: Secondary | ICD-10-CM | POA: Diagnosis not present

## 2017-02-05 DIAGNOSIS — S79919A Unspecified injury of unspecified hip, initial encounter: Secondary | ICD-10-CM | POA: Diagnosis not present

## 2017-02-05 DIAGNOSIS — S72009A Fracture of unspecified part of neck of unspecified femur, initial encounter for closed fracture: Secondary | ICD-10-CM | POA: Diagnosis not present

## 2017-02-05 DIAGNOSIS — E785 Hyperlipidemia, unspecified: Secondary | ICD-10-CM | POA: Diagnosis not present

## 2017-02-05 DIAGNOSIS — D62 Acute posthemorrhagic anemia: Secondary | ICD-10-CM | POA: Diagnosis not present

## 2017-02-05 LAB — BASIC METABOLIC PANEL
Anion gap: 5 (ref 5–15)
BUN: 17 mg/dL (ref 6–20)
CO2: 28 mmol/L (ref 22–32)
Calcium: 8.8 mg/dL — ABNORMAL LOW (ref 8.9–10.3)
Chloride: 108 mmol/L (ref 101–111)
Creatinine, Ser: 0.92 mg/dL (ref 0.44–1.00)
GFR calc Af Amer: >60 mL/min (ref >60)
GFR, EST NON AFRICAN AMERICAN: 56 mL/min — AB (ref >60)
GLUCOSE: 87 mg/dL (ref 65–99)
Potassium: 4.2 mmol/L (ref 3.5–5.1)
SODIUM: 141 mmol/L (ref 135–145)

## 2017-02-05 LAB — CBC
HCT: 32.8 % — ABNORMAL LOW (ref 36.0–46.0)
Hemoglobin: 11.2 g/dL — ABNORMAL LOW (ref 12.0–15.0)
MCH: 31.6 pg (ref 26.0–34.0)
MCHC: 34.1 g/dL (ref 30.0–36.0)
MCV: 92.7 fL (ref 78.0–100.0)
Platelets: 170 10*3/uL (ref 150–400)
RBC: 3.54 MIL/uL — AB (ref 3.87–5.11)
RDW: 12.8 % (ref 11.5–15.5)
WBC: 7.9 10*3/uL (ref 4.0–10.5)

## 2017-02-05 MED ORDER — SENNA 8.6 MG PO TABS
1.0000 | ORAL_TABLET | Freq: Every day | ORAL | Status: DC
  Administered 2017-02-05: 12:00:00 8.6 mg via ORAL
  Filled 2017-02-05: qty 1

## 2017-02-05 MED ORDER — DOCUSATE SODIUM 100 MG PO CAPS: 100.0000 mg | ORAL_CAPSULE | Freq: Two times a day (BID) | ORAL | 0 refills | Status: DC

## 2017-02-05 MED ORDER — POLYETHYLENE GLYCOL 3350 17 G PO PACK: 17.0000 g | PACK | Freq: Every day | ORAL | 0 refills | Status: DC

## 2017-02-05 MED ORDER — POLYETHYLENE GLYCOL 3350 17 G PO PACK: 17.0000 g | PACK | Freq: Two times a day (BID) | ORAL | 0 refills | Status: DC

## 2017-02-05 MED ORDER — METHOCARBAMOL 500 MG PO TABS: 500.0000 mg | ORAL_TABLET | Freq: Four times a day (QID) | ORAL | 0 refills | Status: DC | PRN

## 2017-02-05 MED ORDER — POLYETHYLENE GLYCOL 3350 17 G PO PACK
17.0000 g | PACK | Freq: Every day | ORAL | Status: DC
  Administered 2017-02-05: 12:00:00 17 g via ORAL
  Filled 2017-02-05: qty 1

## 2017-02-05 MED ORDER — SENNA 8.6 MG PO TABS: 1.0000 | ORAL_TABLET | Freq: Every day | ORAL | 0 refills | Status: DC

## 2017-02-05 NOTE — Progress Notes (Signed)
Pt for SNF placement at discharge. No CM needs communicated. Marney Doctor RN,BSN,NCM [phone]

## 2017-02-05 NOTE — Progress Notes (Signed)
Patient transferred to [facility] via [place], report called to Rayne Du, LPN

## 2017-02-05 NOTE — Discharge Summary (Addendum)
[facility]  Physician Discharge Summary   Patient ID: Sarah Moyer MRN: [id number] DOB/AGE: 81-May-1931 81 y.o.  Admit date: 02/02/2017 Discharge date: 02/05/2017  PCP: Tivis Ringer, MD  DISCHARGE DIAGNOSES:  Principal Problem:   Closed right hip fracture ([place]) Active Problems:   Essential hypertension, benign   Hyperlipidemia   Dementia   RECOMMENDATIONS FOR OUTPATIENT FOLLOW UP: 1. CBC and basic metabolic panel in 1 week 2. Patient should ambulate with oxygen through nasal cannula at least for the initial few days. Check sats periodically.   DISCHARGE CONDITION: fair  Diet recommendation: Low-sodium  Filed Weights   02/02/17 1021 02/02/17 1350  Weight: 59 kg (130 lb) 59 kg (130 lb)    INITIAL HISTORY: 83 y.o.femalewith medical history of L breast CA s/p treatment, HTN, dementia who was exercising at a [employer]neakers class at the [facility] when she fell and developed R hip pain. She is found to have a R hip fracture. She was hospitalized for further management.  Consultants: Orthopedics  Procedures:  Righthip hemiarthroplasty 4/10   HOSPITAL COURSE:   Closed right hip fracture  Patient is status post right hip hemiarthroplasty. Patient is stable from a medical standpoint. Pain control. Started on aspirin for DVT prophylaxis per orthopedics. Plan is for patient to go to a skilled nursing facility for rehabilitation. Patient does mention constipation. She'll be given laxatives.  Elevated blood pressure Blood pressure was noted to be elevated initially, most likely due to pain. Now improved. Continue to monitor at skilled nursing facility. Patient without known history of hypertension and is not noted to be on any blood pressure lowering medications at home.  Hyperlipidemia Continue simvastatin  Dementia Continue Aricept. Has been stable.  Normocytic anemia Drop in hemoglobin is likely dilutional. No evidence for overt bleeding. Recheck  labs in 1 week.  While working with physical therapy. Patient became hypoxic with sats in the 70s. She was taken back to her room. At rest she rapidly improved. Sats were back in the mid 90s. Chest x-ray ordered and showed atelectasis. Patient back to baseline. SNF to use O2 while she is ambulating, at least for initial few days.  Overall, stable. Okay for discharge to skilled nursing facility when bed is available.   PERTINENT LABS:  The results of significant diagnostics from this hospitalization (including imaging, microbiology, ancillary and laboratory) are listed below for reference.    Microbiology: Recent Results (from the past 240 hour(s))  Surgical PCR screen     Status: None   Collection Time: 02/02/17  2:28 PM  Result Value Ref Range Status   MRSA, PCR NEGATIVE NEGATIVE Final   Staphylococcus aureus NEGATIVE NEGATIVE Final    Comment:        The Xpert SA Assay (FDA approved for NASAL specimens in patients over 44 years of age), is one component of a comprehensive surveillance program.  Test performance has been validated by [facility] for patients greater than or equal to 86 year old. It is not intended to diagnose infection nor to guide or monitor treatment.      Labs: Basic Metabolic Panel:  Recent Labs Lab 02/02/17 1104 02/03/17 0450 02/04/17 0437 02/05/17 0454  NA 141 139 137 141  K 4.2 4.3 4.4 4.2  CL 107 109 107 108  CO2 27 26 24 28   GLUCOSE 99 97 138* 87  BUN 19 19 17 17   CREATININE 0.95 1.00 0.88 0.92  CALCIUM 9.3 8.7* 8.5* 8.8*   CBC:  Recent Labs Lab  02/02/17 1104 02/03/17 0450 02/04/17 0437 02/05/17 0454  WBC 11.1* 8.6 10.8* 7.9  NEUTROABS 8.9*  --   --   --   HGB 14.0 12.3 11.6* 11.2*  HCT 40.8 36.5 33.4* 32.8*  MCV 91.3 92.9 91.5 92.7  PLT 214 180 181 170    IMAGING STUDIES Pelvis Portable  Result Date: 02/03/2017 CLINICAL DATA:  Status post right hip replacement. EXAM: PORTABLE PELVIS 1-2 VIEWS COMPARISON:  June 21, 2012 FINDINGS: The patient is status post right hip replacement in the interval. Acetabular and femoral components are in good position. No other abnormalities. IMPRESSION: Status post right hip replacement as above. Electronically Signed   By: Dorise Bullion III M.D   On: 02/03/2017 13:43   Dg Chest Port 1 View  Result Date: 02/02/2017 CLINICAL DATA:  Pre operative respiratory exam.  Right hip fracture. EXAM: PORTABLE CHEST 1 VIEW COMPARISON:  11/23/2013 FINDINGS: Heart size and pulmonary vascularity are normal. Large chronic hiatal hernia. Lungs are clear. IMPRESSION: Large chronic hiatal hernia.  No acute abnormality. Electronically Signed   By: Lorriane Shire M.D.   On: 02/02/2017 11:14   Dg Hip Unilat W Or Wo Pelvis 2-3 Views Right  Result Date: 02/02/2017 CLINICAL DATA:  Right hip pain secondary to a fall at home this morning. EXAM: DG HIP (WITH OR WITHOUT PELVIS) 2-3V RIGHT COMPARISON:  Radiographs dated 06/21/2012 and 06/20/2012 FINDINGS: There is a slightly impacted subcapital fracture of the right femoral neck. Pelvic bones are intact. Proximal left femoral prosthesis in place. IMPRESSION: Right femoral neck fracture. Electronically Signed   By: Lorriane Shire M.D.   On: 02/02/2017 10:46    DISCHARGE EXAMINATION: Vitals:   02/04/17 1301 02/04/17 2155 02/05/17 0559 02/05/17 0923  BP: (!) 113/40 (!) 147/46 (!) 151/79 137/69  Pulse: (!) 52 63 72 86  Resp: 18 16 15 16   Temp: 98 F (36.7 C) 98.2 F (36.8 C) 98.2 F (36.8 C) 97.6 F (36.4 C)  TempSrc: Oral Oral Oral Oral  SpO2: 100% 98% 95% 98%  Weight:      Height:       General appearance: alert, cooperative, appears stated age and no distress Resp: clear to auscultation bilaterally Cardio: regular rate and rhythm, S1, S2 normal, no murmur, click, rub or gallop GI: soft, non-tender; bowel sounds normal; no masses,  no organomegaly Extremities: No bruising noted over the right thigh   DISPOSITION: SNF  Discharge Instructions     Call MD for:  extreme fatigue    Complete by:  As directed    Call MD for:  persistant dizziness or light-headedness    Complete by:  As directed    Call MD for:  persistant nausea and vomiting    Complete by:  As directed    Call MD for:  severe uncontrolled pain    Complete by:  As directed    Call MD for:  temperature >100.4    Complete by:  As directed    Diet - low sodium heart healthy    Complete by:  As directed    Discharge instructions    Complete by:  As directed    Please see instructions on the discharge summary.  You were cared for by a hospitalist during your hospital stay. If you have any questions about your discharge medications or the care you received while you were in the hospital after you are discharged, you can call the unit and asked to speak with the hospitalist on call  if the hospitalist that took care of you is not available. Once you are discharged, your primary care physician will handle any further medical issues. Please note that NO REFILLS for any discharge medications will be authorized once you are discharged, as it is imperative that you return to your primary care physician (or establish a relationship with a primary care physician if you do not have one) for your aftercare needs so that they can reassess your need for medications and monitor your lab values. If you do not have a primary care physician, you can call [phone] for a physician referral.   Increase activity slowly    Complete by:  As directed    Weight bearing as tolerated    Complete by:  As directed    Laterality:  right   Extremity:  Lower      ALLERGIES: No Known Allergies   Current Discharge Medication List    START taking these medications   Details  aspirin 81 MG chewable tablet Chew 1 tablet (81 mg total) by mouth 2 (two) times daily. Take for 4 weeks, then resume regular dose. Qty: 60 tablet, Refills: 0    ferrous sulfate 325 (65 FE) MG tablet Take 1 tablet (325 mg  total) by mouth 3 (three) times daily after meals. For 2-3 weeks. Refills: 3    HYDROcodone-acetaminophen (NORCO/VICODIN) 5-325 MG tablet Take 1-2 tablets by mouth every 6 (six) hours as needed for moderate pain. Qty: 40 tablet, Refills: 0    methocarbamol (ROBAXIN) 500 MG tablet Take 1 tablet (500 mg total) by mouth every 6 (six) hours as needed for muscle spasms. Qty: 30 tablet, Refills: 0    polyethylene glycol (MIRALAX / GLYCOLAX) packet Take 17 g by mouth daily. Qty: 14 each, Refills: 0    senna (SENOKOT) 8.6 MG TABS tablet Take 1 tablet (8.6 mg total) by mouth daily. Qty: 120 each, Refills: 0      CONTINUE these medications which have NOT CHANGED   Details  acetaminophen (TYLENOL) 500 MG tablet Take 500 mg by mouth every 6 (six) hours as needed.    Calcium Carbonate-Vitamin D (CALCIUM PLUS VITAMIN D PO) Take by mouth daily.    donepezil (ARICEPT) 10 MG tablet Take 10 mg by mouth every evening.    simvastatin (ZOCOR) 20 MG tablet Take 20 mg by mouth daily.       STOP taking these medications     aspirin EC 81 MG tablet           Contact information for follow-up providers    Mauri Pole, MD. Schedule an appointment as soon as possible for a visit in 2 week(s).   Specialty:  Orthopedic Surgery Contact information: [address] [place] [address] [phone]        Tivis Ringer, MD. Schedule an appointment as soon as possible for a visit in 3 week(s).   Specialty:  Internal Medicine Contact information: [address] [place] [address] [phone]            Contact information for after-discharge care    [facility] Follow up.   Specialty:  Skilled Nursing Facility Contact information: [address] [place] [phone]                  TOTAL DISCHARGE TIME: 71 mins  [facility]  [facility] Pager [phone]  02/05/2017, 10:44  AM

## 2017-02-05 NOTE — Progress Notes (Addendum)
CSW following/assisting with disposition. CSW checked in with Kerri at [facility], the authorization is still pending at this time.  She will follow up with CSW once authorization is received.   2:30pm Authorization received.[facility] called for Transport-2 hour wait time.   Nurse informed. Patient and Spouse inform.   Kathrin Greathouse, Latanya Presser, MSW Clinical Social Worker 5E and Psychiatric Service Line [phone] 02/05/2017  10:38 AM

## 2017-02-05 NOTE — Progress Notes (Signed)
Physical Therapy Treatment Patient Details Name: Sarah Moyer MRN: [id number] DOB: [birth year] Today's Date: 02/05/2017    History of Present Illness s/p R  hemiarthroplasty , posterior approach due to femoral neck fx. PMH:  breast CA, dementia, L THA 5 years ago    PT Comments    POD # 2 am session Assisted OOB to amb to bathroom.  Assisted in bathroom then amb to recliner pt c/o dizziness.  Noted 3/4 dyspnea.  RA 74% and HR 88.  BP 134/82.  Assisted to recliner and reapllied 2 lts nasal.  Within 2 min sats increased to 98% but dizziness/fatigue remained.  Offered fluids and notified RN.   Follow Up Recommendations  SNF     Equipment Recommendations       Recommendations for Other Services       Precautions / Restrictions Precautions Precautions: Posterior Hip;Fall Precaution Comments: pt unable to recall any THR so re educated Restrictions Weight Bearing Restrictions: No Other Position/Activity Restrictions: WBAT    Mobility  Bed Mobility Overal bed mobility: Needs Assistance Bed Mobility: Supine to Sit     Supine to sit: Mod assist;Max assist     General bed mobility comments: assist for legs and trunk; cues for sequence, HOB elevated and use of pad to complete swival/EOB  Transfers Overall transfer level: Needs assistance Equipment used: Rolling walker (2 wheeled) Transfers: Sit to/from [facility] Sit to Stand: Mod assist Stand pivot transfers: Mod assist       General transfer comment: assist to rise and stabilize. Cues for UE/LE placement and assist to keep RLE extended when sitting.  Cues to weight shift forward when standing  Ambulation/Gait Ambulation/Gait assistance: Mod assist;Max assist Ambulation Distance (Feet): 22 Feet (10 feet to bathroom then another 12 feet to recliner) Assistive device: Rolling walker (2 wheeled) Gait Pattern/deviations: Step-to pattern;Decreased stance time - right;Trunk flexed Gait velocity:  decreased   General Gait Details: increased time and support with difficulty advancing R LE.  50% VC's on proper walker to self distance and 75% VC's safety with turns (bathroom)   Stairs            Wheelchair Mobility    Modified Rankin (Stroke Patients Only)       Balance                                            Cognition Arousal/Alertness: Awake/alert   Overall Cognitive Status: Difficult to assess                                 General Comments: required repeat functional VC's and able to epress wants/needs      Exercises  R LE LAQ's x 10 resp b LE AP x 10 reps B LE knee presses x 10 resp B butt squeezes    General Comments        Pertinent Vitals/Pain Pain Assessment: Faces Faces Pain Scale: Hurts little more Pain Location: Rt hip Pain Descriptors / Indicators: Operative site guarding;Discomfort;Grimacing Pain Intervention(s): Monitored during session;Repositioned    Home Living                      Prior Function            PT Goals (current goals can now be found in  the care plan section) Progress towards PT goals: Progressing toward goals    Frequency    Min 3X/week      PT Plan Current plan remains appropriate    Co-evaluation             End of Session Equipment Utilized During Treatment: Gait belt Activity Tolerance: Patient limited by fatigue;Other (comment) (dizzy and low RA sats) Patient left: in chair;with family/visitor present;with call bell/phone within reach Nurse Communication: Mobility status PT Visit Diagnosis: Unsteadiness on feet (R26.81);Difficulty in walking, not elsewhere classified (R26.2);Pain     Time: 7225-7505 PT Time Calculation (min) (ACUTE ONLY): 27 min  Charges:  $Gait Training: 8-22 mins $Therapeutic Activity: 8-22 mins                    G Codes:       Rica Koyanagi  PTA [facility]  Acute  Rehab Pager      [phone]

## 2017-02-05 NOTE — Progress Notes (Signed)
     Subjective: 2 Days Post-Op Procedure(s) (LRB): ARTHROPLASTY BIPOLAR HIP (HEMIARTHROPLASTY) RIGHT (Right)   Patient reports pain as mild, pain controlled.  No reported events throughout the night.  Orthopaedically stable.  Objective:   VITALS:   Vitals:   02/04/17 2155 02/05/17 0559  BP: (!) 147/46 (!) 151/79  Pulse: 63 72  Resp: 16 15  Temp: 98.2 F (36.8 C) 98.2 F (36.8 C)    Dorsiflexion/Plantar flexion intact Incision: dressing C/D/I No cellulitis present Compartment soft  LABS  Recent Labs  02/03/17 0450 02/04/17 0437 02/05/17 0454  HGB 12.3 11.6* 11.2*  HCT 36.5 33.4* 32.8*  WBC 8.6 10.8* 7.9  PLT 180 181 170     Recent Labs  02/03/17 0450 02/04/17 0437 02/05/17 0454  NA 139 137 141  K 4.3 4.4 4.2  BUN 19 17 17   CREATININE 1.00 0.88 0.92  GLUCOSE 97 138* 87     Assessment/Plan: 2 Days Post-Op Procedure(s) (LRB): ARTHROPLASTY BIPOLAR HIP (HEMIARTHROPLASTY) RIGHT (Right) Orthopaedically stable Up with therapy Discharge to SNF when ready medically and approved   Ortho recommendations:  ASA 81 mg bid for 4 weeks for anticoagulation, unless other medically indicated.  Norco for pain management (Rx written).  MiraLax and Colace for constipation  Iron 325 mg tid for 2-3 weeks   WBAT on the right leg.  Dressing to remain in place until follow in clinic in 2 weeks.  Dressing is waterproof and may shower with it in place.  Follow up in 2 weeks at [facility]. Follow up with OLIN,Kenyada Hy D in 2 weeks.  Contact information:  [facility] [address], [address] [place]   PAC  02/05/2017, 7:20 AM

## 2017-02-05 NOTE — Progress Notes (Signed)
SpO2% dropped to 70's while up with PT.  Placed on 2L O2, SpO2% in mid 90s, no distress at present time.  Text page sent to Dr. Maryland Pink, will order CXR prior to discharge

## 2017-02-05 NOTE — Discharge Instructions (Signed)
Hip Fracture A hip fracture is a fracture of the upper part of your thigh bone (femur). What are the causes? A hip fracture is caused by a direct blow to the side of your hip. This is usually the result of a fall but can occur in other circumstances, such as an automobile accident. What increases the risk? There is an increased risk of hip fractures in people with:  An unsteady walking pattern (gait) and those with conditions that contribute to poor balance, such as Parkinson's disease or dementia.  Osteopenia and osteoporosis.  Cancer that spreads to the leg bones.  Certain metabolic diseases.  What are the signs or symptoms? Symptoms of hip fracture include:  Pain over the injured hip.  Inability to put weight on the leg in which the fracture occurred (although, some patients are able to walk after a hip fracture).  Toes and foot of the affected leg point outward when you lie down.  How is this diagnosed? A physical exam can determine if a hip fracture is likely to have occurred. X-ray exams are needed to confirm the fracture and to look for other injuries. The X-ray exam can help to determine the type of hip fracture. Rarely, the fracture is not visible on an X-ray image and a CT scan or MRI will have to be done. How is this treated? The treatment for a fracture is usually surgery. This means using a screw, nail, or rod to hold the bones in place. Follow these instructions at home: Take all medicines as directed by your health care provider. Contact a health care provider if: Pain continues, even after taking pain medicine. This information is not intended to replace advice given to you by your health care provider. Make sure you discuss any questions you have with your health care provider. Document Released: 10/13/2005 Document Revised: 03/20/2016 Document Reviewed: 05/25/2013 Elsevier Interactive Patient Education  2017 [employer]  

## 2017-02-05 NOTE — Progress Notes (Signed)
While working with physical therapy. Patient became hypoxic with sats in the 70s. She was taken back to her room. At rest she rapidly improved. Sats were back in the mid 90s. Chest x-ray ordered and showed atelectasis. Patient back to baseline. SNF to use O2 while she is ambulating, at least for initial few days.

## 2017-02-06 ENCOUNTER — Non-Acute Institutional Stay (SKILLED_NURSING_FACILITY): Payer: Medicare HMO | Admitting: Internal Medicine

## 2017-02-06 ENCOUNTER — Encounter: Payer: Self-pay | Admitting: Internal Medicine

## 2017-02-06 DIAGNOSIS — F0390 Unspecified dementia without behavioral disturbance: Secondary | ICD-10-CM | POA: Diagnosis not present

## 2017-02-06 DIAGNOSIS — D62 Acute posthemorrhagic anemia: Secondary | ICD-10-CM

## 2017-02-06 DIAGNOSIS — M810 Age-related osteoporosis without current pathological fracture: Secondary | ICD-10-CM | POA: Diagnosis not present

## 2017-02-06 DIAGNOSIS — S72002D Fracture of unspecified part of neck of left femur, subsequent encounter for closed fracture with routine healing: Secondary | ICD-10-CM

## 2017-02-06 NOTE — Progress Notes (Signed)
Location:   [facility] Room Number: 129/P Place of Service:  SNF (31) Provider:  Robyn Haber, MD  Patient Care Team: Prince Solian, MD as PCP - General (Internal Medicine)  Extended Emergency Contact Information Primary Emergency Contact: Gordan,[name] Address: [address] [place] [address] [place] of [place] Phone: [phone] Relation: Spouse Secondary Emergency Contact: Clayburn Pert [place] of [place] Phone: [phone] Relation: Son  Code Status:  DNR Goals of care: Advanced Directive information Advanced Directives 02/06/2017  Does Patient Have a Medical Advance Directive? Yes  Type of Advance Directive Out of facility DNR (pink MOST or yellow form)  Does patient want to make changes to medical advance directive? No - Patient declined  Copy of [place] in Chart? -  Would patient like information on creating a medical advance directive? No - Patient declined  Pre-existing out of facility DNR order (yellow form or pink MOST form) -     Chief Complaint  Patient presents with  . Acute Visit  Status post hospitalization for right hip fracture with repair  HPI:  Pt is a 83 y.o. female seen today for an acute visit for entry into skilled nursing for rehabilitation after sustaining a right hip fracture after a fall-this was surgically repaired Her postop course was fairly unremarkable she did have postop anemia but apparently did not require transfusion hemoglobin was 11.2 on lab done on April 12 we will recheck that next week.  She did have some elevated blood pressures this was thought secondary to pain-she is receiving Norco as needed for pain and she says this is helping also has when necessary Robaxin.  For DVT prophylaxis she is on aspirin 81 mg twice a day for 4 weeks and then will be reduced to daily.  In regards to the anemia she is on iron.  She apparently  before discharge became hypoxic while she was working with physical therapy but apparently O2 stats quickly normalize when she was resting there is an order to consider using oxygen while she is ambulating at least for a few days.  She does have a history of breast cancer on the left classified as ductal carcinoma in situ this is followed by oncology and apparently follow-up visit has been arranged in approximately a year she is status post lumpectomy and radiation  Currently she is sitting in her wheelchair comfortably does not complaining of pain currently continues to be very bright alert good spirits she is visiting with her husband.  Vital signs appear to be stable blood pressures mildly elevated with a systolic of 696 this will need to be monitored she does not really have an extensive hypertension history currently on no medications.    Past Medical History:  Diagnosis Date  . Arrhythmia    left BBB,    PVC'S  . Cancer ([facility]) 10/2013   LEFT BREAST CANCER--LUMPECTOMY  . DDD (degenerative disc disease)   . Diverticular disease    With colonic polyps  . Hiatal hernia   . Hyperlipidemia   . Hypertension     no meds    dr Radene Gunning  . Osteopenia    Past Surgical History:  Procedure Laterality Date  . APPENDECTOMY    . BREAST LUMPECTOMY WITH NEEDLE LOCALIZATION Left 11/25/2013   Procedure: LEFT BREAST WIRE GUIDED LUMPECTOMY;  Surgeon: Rolm Bookbinder, MD;  Location: [facility];  Service: General;  Laterality:  Left;  . BREAST SURGERY    . CATARACT EXTRACTION    . HIP ARTHROPLASTY  06/21/2012   Procedure: ARTHROPLASTY BIPOLAR HIP;  Surgeon: Mauri Pole, MD;  Location: [facility];  Service: Orthopedics;  Laterality: Left;  . HIP ARTHROPLASTY Right 02/03/2017   Procedure: ARTHROPLASTY BIPOLAR HIP (HEMIARTHROPLASTY) RIGHT;  Surgeon: Paralee Cancel, MD;  Location: [facility];  Service: Orthopedics;  Laterality: Right;    No Known Allergies  Outpatient Encounter Prescriptions as of 02/06/2017    Medication Sig  . acetaminophen (TYLENOL) 500 MG tablet Take 500 mg by mouth every 6 (six) hours as needed.  Marland Kitchen aspirin 81 MG chewable tablet Chew 1 tablet (81 mg total) by mouth 2 (two) times daily. Take for 4 weeks, then resume regular dose.  . Calcium Carbonate-Vitamin D (CALCIUM PLUS VITAMIN D PO) Take by mouth daily.  Marland Kitchen donepezil (ARICEPT) 10 MG tablet Take 10 mg by mouth every evening.  . ferrous sulfate 325 (65 FE) MG tablet Take 1 tablet (325 mg total) by mouth 3 (three) times daily after meals. For 2-3 weeks.  Marland Kitchen HYDROcodone-acetaminophen (NORCO/VICODIN) 5-325 MG tablet Take 1-2 tablets by mouth every 6 (six) hours as needed for moderate pain.  . methocarbamol (ROBAXIN) 500 MG tablet Take 1 tablet (500 mg total) by mouth every 6 (six) hours as needed for muscle spasms.  . polyethylene glycol (MIRALAX / GLYCOLAX) packet Take 17 g by mouth daily.  Marland Kitchen senna (SENOKOT) 8.6 MG TABS tablet Take 1 tablet (8.6 mg total) by mouth daily.  . simvastatin (ZOCOR) 20 MG tablet Take 20 mg by mouth daily.    No facility-administered encounter medications on file as of 02/06/2017.     Review of Systems   In general is not complaining of any fever or chills.  Skin is not complaining of rashes or itching surgical site is currently covered.  Head ears eyes nose mouth and throat does not complaining of any sore throat or visual changes.  Respiratory denies shortness breath or cough did have hypoxia while working with therapy in the hospital but does not complaining of any shortness of breath now or cough.  Cardiac does not complaining of any chest pain palpitations or edema.  GI is not complaining of any nausea vomiting diarrhea constipation or abdominal discomfort.  GU does not complain of dysuria.  Muscle skeletal has had some hip pain but apparently the Norco and Robaxin are helping.  Neurologic is not complaining of dizziness headache or numbness.  Psych does have some history of dementia but  this appears to be quite mild appears to be in very good spirits certainly no complaints of depression or anxiety at this time.     There is no immunization history on file for this patient. Pertinent  Health Maintenance Due  Topic Date Due  . PNA vac Low Risk Adult (1 of 2 - PCV13) 09/26/2017 (Originally 08[birth year]22/1997)  . INFLUENZA VACCINE  05/27/2017  . DEXA SCAN  Completed   No flowsheet data found. Functional Status Survey:    Vitals:   02/06/17 1326  BP: (!) 154/75  Pulse: 67  Resp: 20  Temp: 98 F (36.7 C)  TempSrc: Oral   There is no height or weight on file to calculate BMI. Physical Exam In general this is a very pleasant somewhat frail appearing elderly female in no distress sitting comfortably in her wheelchair.  Her skin is warm and dry.  Right hip fracture site currently has dressing applied.  Eyes sclera  and conjunctiva clear visual acuity appears grossly intact.  Oropharynx clear. Membranes moist.  Chest is clear to auscultation there is no labored breathing.  Heart is regular rate and rhythm without murmur gallop or rub she does not really have significant lower extremity edema pedal pulses are intact.  Abdomen is soft nontender positive bowel sounds.  Muscle skeletal is able to stand but is quite weak strength appears to be intact upper extremities bilaterally and left lower extremity some weakness secondary to surgery of the right lower extremity  Neurologic is grossly intact her speech is clear no lateralizing findings.  Psych she appears alert and oriented pleasant and appropriate appear to be an accurate historian very pleasant individual.   Labs reviewed:  Recent Labs  02/03/17 0450 02/04/17 0437 02/05/17 0454  NA 139 137 141  K 4.3 4.4 4.2  CL 109 107 108  CO2 26 24 28   GLUCOSE 97 138* 87  BUN 19 17 17   CREATININE 1.00 0.88 0.92  CALCIUM 8.7* 8.5* 8.8*   No results for input(s): AST, ALT, ALKPHOS, BILITOT, PROT, ALBUMIN in the last  8760 hours.  Recent Labs  02/02/17 1104 02/03/17 0450 02/04/17 0437 02/05/17 0454  WBC 11.1* 8.6 10.8* 7.9  NEUTROABS 8.9*  --   --   --   HGB 14.0 12.3 11.6* 11.2*  HCT 40.8 36.5 33.4* 32.8*  MCV 91.3 92.9 91.5 92.7  PLT 214 180 181 170   No results found for: TSH No results found for: HGBA1C No results found for: CHOL, HDL, LDLCALC, LDLDIRECT, TRIG, CHOLHDL  Significant Diagnostic Results in last 30 days:  Pelvis Portable  Result Date: 02/03/2017 CLINICAL DATA:  Status post right hip replacement. EXAM: PORTABLE PELVIS 1-2 VIEWS COMPARISON:  June 21, 2012 FINDINGS: The patient is status post right hip replacement in the interval. Acetabular and femoral components are in good position. No other abnormalities. IMPRESSION: Status post right hip replacement as above. Electronically Signed   By: Dorise Bullion III M.D   On: 02/03/2017 13:43   Dg Chest Port 1 View  Result Date: 02/05/2017 CLINICAL DATA:  83 year old female with a history of hypoxia status post surgery EXAM: PORTABLE CHEST 1 VIEW COMPARISON:  02/02/2017 FINDINGS: Cardiomediastinal silhouette unchanged. Double density at the inferior mediastinum. Calcifications of the aortic arch. Opacity at the left costophrenic angle. No pneumothorax.  Right lung relatively clear. IMPRESSION: Ill-defined opacity at the left base at the costophrenic angle, potentially atelectasis or consolidation. If there is concern for acute process, at least a formal PA and lateral chest x-ray would be recommended, or alternatively chest CT. Aortic atherosclerosis. Hiatal hernia. Electronically Signed   By: Corrie Mckusick D.O.   On: 02/05/2017 13:34   Dg Chest Port 1 View  Result Date: 02/02/2017 CLINICAL DATA:  Pre operative respiratory exam.  Right hip fracture. EXAM: PORTABLE CHEST 1 VIEW COMPARISON:  11/23/2013 FINDINGS: Heart size and pulmonary vascularity are normal. Large chronic hiatal hernia. Lungs are clear. IMPRESSION: Large chronic hiatal  hernia.  No acute abnormality. Electronically Signed   By: Lorriane Shire M.D.   On: 02/02/2017 11:14   Dg Hip Unilat W Or Wo Pelvis 2-3 Views Right  Result Date: 02/02/2017 CLINICAL DATA:  Right hip pain secondary to a fall at home this morning. EXAM: DG HIP (WITH OR WITHOUT PELVIS) 2-3V RIGHT COMPARISON:  Radiographs dated 06/21/2012 and 06/20/2012 FINDINGS: There is a slightly impacted subcapital fracture of the right femoral neck. Pelvic bones are intact. Proximal left femoral prosthesis  in place. IMPRESSION: Right femoral neck fracture. Electronically Signed   By: Lorriane Shire M.D.   On: 02/02/2017 10:46    Assessment/Plan   #1 history of right femoral hip fracture status post repair she is receiving Norco as needed for pain also has when necessary Robaxin at this point appears to be stable she is on aspirin 81 mg twice a day for 4 weeks for DVT prophylaxis then this will be reduced to once a day.  She will need follow-up by orthopedics and therapy for strengthening.  She does live at home with her husband.  #2 anemia most likely postop she has been started on iron hemoglobin of 11.2 shows relative postop stability we will recheck this next week.  #3 history of dementia this appears to be quite mild she is on Aricept 10 mg a day.  #4 history of osteoporosis she is on calcium supplementation.  #5 history of constipation continues on senna, MiraLAX she does not complaining of constipation this afternoon at this point will watch.  #6 history of hyperlipidemia continues on Zocor since her stay here I suspect we'll be quite short suspect we will not be real aggressive pursuing a lipid panel and will defer to primary care provider.  #7 hypertension question mark does not really have a history of this at this point will monitor occasional spikes in hospital were thought to be pain related again will monitor before making any medication adjustments or adding a new medication.  #8 hypoxia  this appears to be related more to exertion-at this point will monitor she is not complaining of any shortness of breath or cough-chest x-ray did not really show any acute process.  #9 history of breast cancer on the left-again this is followed by oncology and thought to be stable status post lumpectomy and radiation  Again she will need update CBC and BMP next week with history of anemia also keep eye  on her renal function she appears to be quite stable will need therapy has a very supportive husband and hopes to get home fairly shortly.  [id number] note greater than 35 minutes spent assessing patient-reviewing her chart-reviewing her labs-and coordinating and formulating a plan of care for numerous diagnoses-of note greater than 50% of time spent coordinating plan of care      Oralia Manis, [place]

## 2017-02-09 ENCOUNTER — Encounter: Payer: Self-pay | Admitting: Internal Medicine

## 2017-02-09 ENCOUNTER — Other Ambulatory Visit: Payer: Self-pay | Admitting: *Deleted

## 2017-02-09 ENCOUNTER — Non-Acute Institutional Stay (SKILLED_NURSING_FACILITY): Payer: Medicare HMO | Admitting: Internal Medicine

## 2017-02-09 ENCOUNTER — Other Ambulatory Visit (HOSPITAL_COMMUNITY)
Admission: RE | Admit: 2017-02-09 | Discharge: 2017-02-09 | Disposition: A | Discharge status: Home / Self Care | Payer: Medicare HMO | Source: Skilled Nursing Facility | Attending: Internal Medicine | Admitting: Internal Medicine

## 2017-02-09 DIAGNOSIS — E785 Hyperlipidemia, unspecified: Secondary | ICD-10-CM | POA: Diagnosis not present

## 2017-02-09 DIAGNOSIS — S72001S Fracture of unspecified part of neck of right femur, sequela: Secondary | ICD-10-CM | POA: Diagnosis not present

## 2017-02-09 DIAGNOSIS — I10 Essential (primary) hypertension: Secondary | ICD-10-CM

## 2017-02-09 LAB — BASIC METABOLIC PANEL
Anion gap: 6 (ref 5–15)
BUN: 18 mg/dL (ref 6–20)
CHLORIDE: 99 mmol/L — AB (ref 101–111)
CO2: 31 mmol/L (ref 22–32)
Calcium: 9.3 mg/dL (ref 8.9–10.3)
Creatinine, Ser: 0.79 mg/dL (ref 0.44–1.00)
GFR calc Af Amer: >60 mL/min (ref >60)
GFR calc non Af Amer: >60 mL/min (ref >60)
Glucose, Bld: 98 mg/dL (ref 65–99)
Potassium: 4.5 mmol/L (ref 3.5–5.1)
Sodium: 136 mmol/L (ref 135–145)

## 2017-02-09 LAB — CBC
HCT: 37.7 % (ref 36.0–46.0)
Hemoglobin: 12.6 g/dL (ref 12.0–15.0)
MCH: 31.3 pg (ref 26.0–34.0)
MCHC: 33.4 g/dL (ref 30.0–36.0)
MCV: 93.5 fL (ref 78.0–100.0)
PLATELETS: 260 10*3/uL (ref 150–400)
RBC: 4.03 MIL/uL (ref 3.87–5.11)
RDW: 12.8 % (ref 11.5–15.5)
WBC: 9.3 10*3/uL (ref 4.0–10.5)

## 2017-02-09 MED ORDER — HYDROCODONE-ACETAMINOPHEN 5-325 MG PO TABS: ORAL_TABLET | ORAL | 0 refills | Status: DC

## 2017-02-09 NOTE — Progress Notes (Deleted)
Location:   [facility] Room Number: 129/P Place of Service:  SNF (31) Provider:  Kathrin Ruddy, MD  Patient Care Team: Prince Solian, MD as PCP - General (Internal Medicine)  Extended Emergency Contact Information Primary Emergency Contact: Peoples,[name] Address: [address] [place] [address] [place] Phone: [phone] Relation: Spouse Secondary Emergency Contact: Clayburn Pert [place] of [place] Phone: [phone] Relation: Son  Code Status:  DNR Goals of care: Advanced Directive information Advanced Directives 02/09/2017  Does Patient Have a Medical Advance Directive? Yes  Type of Advance Directive Out of facility DNR (pink MOST or yellow form)  Does patient want to make changes to medical advance directive? No - Patient declined  Copy of [place] in Chart? -  Would patient like information on creating a medical advance directive? No - Patient declined  Pre-existing out of facility DNR order (yellow form or pink MOST form) -     Chief Complaint  Patient presents with  . New Admit To SNF    HPI:  Pt is a 83 y.o. female seen today for an acute visit for    Past Medical History:  Diagnosis Date  . Arrhythmia    left BBB,    PVC'S  . Cancer ([facility]) 10/2013   LEFT BREAST CANCER--LUMPECTOMY  . DDD (degenerative disc disease)   . Diverticular disease    With colonic polyps  . Hiatal hernia   . Hyperlipidemia   . Hypertension     no meds    dr Radene Gunning  . Osteopenia    Past Surgical History:  Procedure Laterality Date  . APPENDECTOMY    . BREAST LUMPECTOMY WITH NEEDLE LOCALIZATION Left 11/25/2013   Procedure: LEFT BREAST WIRE GUIDED LUMPECTOMY;  Surgeon: Rolm Bookbinder, MD;  Location: [facility];  Service: General;  Laterality: Left;  . BREAST SURGERY    . CATARACT EXTRACTION    . HIP ARTHROPLASTY  06/21/2012   Procedure: ARTHROPLASTY BIPOLAR HIP;  Surgeon:  Mauri Pole, MD;  [facility];  Service: Orthopedics;  Laterality: Left;  . HIP ARTHROPLASTY Right 02/03/2017   Procedure: ARTHROPLASTY BIPOLAR HIP (HEMIARTHROPLASTY) RIGHT;  Surgeon: Paralee Cancel, MD;  [facility];  Service: Orthopedics;  Laterality: Right;    No Known Allergies  Outpatient Encounter Prescriptions as of 02/09/2017  Medication Sig  . acetaminophen (TYLENOL) 500 MG tablet Take 500 mg by mouth every 6 (six) hours as needed.  Marland Kitchen aspirin 81 MG chewable tablet Chew 1 tablet (81 mg total) by mouth 2 (two) times daily. Take for 4 weeks, then resume regular dose.  . Calcium Carbonate-Vitamin D (CALCIUM PLUS VITAMIN D PO) Take by mouth daily.  Marland Kitchen donepezil (ARICEPT) 10 MG tablet Take 10 mg by mouth every evening.  . ferrous sulfate 325 (65 FE) MG tablet Take 1 tablet (325 mg total) by mouth 3 (three) times daily after meals. For 2-3 weeks.  Marland Kitchen HYDROcodone-acetaminophen (NORCO/VICODIN) 5-325 MG tablet Take 1-2 tablets by mouth every 6 (six) hours as needed for moderate pain.  . methocarbamol (ROBAXIN) 500 MG tablet Take 1 tablet (500 mg total) by mouth every 6 (six) hours as needed for muscle spasms.  . polyethylene glycol (MIRALAX / GLYCOLAX) packet Take 17 g by mouth daily.  Marland Kitchen senna (SENOKOT) 8.6 MG TABS tablet Take 1 tablet (8.6 mg total) by mouth daily.  . simvastatin (ZOCOR) 20 MG tablet Take  20 mg by mouth daily.    No facility-administered encounter medications on file as of 02/09/2017.      Review of Systems   There is no immunization history on file for this patient. Pertinent  Health Maintenance Due  Topic Date Due  . PNA vac Low Risk Adult (1 of 2 - PCV13) 09/26/2017 (Originally 08[birth year]22/1997)  . INFLUENZA VACCINE  05/27/2017  . DEXA SCAN  Completed   No flowsheet data found. Functional Status Survey:    There were no vitals filed for this visit. There is no height or weight on file to calculate BMI. Physical Exam  Labs reviewed:  Recent Labs   02/03/17 0450 02/04/17 0437 02/05/17 0454  NA 139 137 141  K 4.3 4.4 4.2  CL 109 107 108  CO2 26 24 28   GLUCOSE 97 138* 87  BUN 19 17 17   CREATININE 1.00 0.88 0.92  CALCIUM 8.7* 8.5* 8.8*   No results for input(s): AST, ALT, ALKPHOS, BILITOT, PROT, ALBUMIN in the last 8760 hours.  Recent Labs  02/02/17 1104 02/03/17 0450 02/04/17 0437 02/05/17 0454  WBC 11.1* 8.6 10.8* 7.9  NEUTROABS 8.9*  --   --   --   HGB 14.0 12.3 11.6* 11.2*  HCT 40.8 36.5 33.4* 32.8*  MCV 91.3 92.9 91.5 92.7  PLT 214 180 181 170   No results found for: TSH No results found for: HGBA1C No results found for: CHOL, HDL, LDLCALC, LDLDIRECT, TRIG, CHOLHDL  Significant Diagnostic Results in last 30 days:  Pelvis Portable  Result Date: 02/03/2017 CLINICAL DATA:  Status post right hip replacement. EXAM: PORTABLE PELVIS 1-2 VIEWS COMPARISON:  June 21, 2012 FINDINGS: The patient is status post right hip replacement in the interval. Acetabular and femoral components are in good position. No other abnormalities. IMPRESSION: Status post right hip replacement as above. Electronically Signed   By: Dorise Bullion III M.D   On: 02/03/2017 13:43   Dg Chest Port 1 View  Result Date: 02/05/2017 CLINICAL DATA:  83 year old female with a history of hypoxia status post surgery EXAM: PORTABLE CHEST 1 VIEW COMPARISON:  02/02/2017 FINDINGS: Cardiomediastinal silhouette unchanged. Double density at the inferior mediastinum. Calcifications of the aortic arch. Opacity at the left costophrenic angle. No pneumothorax.  Right lung relatively clear. IMPRESSION: Ill-defined opacity at the left base at the costophrenic angle, potentially atelectasis or consolidation. If there is concern for acute process, at least a formal PA and lateral chest x-ray would be recommended, or alternatively chest CT. Aortic atherosclerosis. Hiatal hernia. Electronically Signed   By: Corrie Mckusick D.O.   On: 02/05/2017 13:34   Dg Chest Port 1  View  Result Date: 02/02/2017 CLINICAL DATA:  Pre operative respiratory exam.  Right hip fracture. EXAM: PORTABLE CHEST 1 VIEW COMPARISON:  11/23/2013 FINDINGS: Heart size and pulmonary vascularity are normal. Large chronic hiatal hernia. Lungs are clear. IMPRESSION: Large chronic hiatal hernia.  No acute abnormality. Electronically Signed   By: Lorriane Shire M.D.   On: 02/02/2017 11:14   Dg Hip Unilat W Or Wo Pelvis 2-3 Views Right  Result Date: 02/02/2017 CLINICAL DATA:  Right hip pain secondary to a fall at home this morning. EXAM: DG HIP (WITH OR WITHOUT PELVIS) 2-3V RIGHT COMPARISON:  Radiographs dated 06/21/2012 and 06/20/2012 FINDINGS: There is a slightly impacted subcapital fracture of the right femoral neck. Pelvic bones are intact. Proximal left femoral prosthesis in place. IMPRESSION: Right femoral neck fracture. Electronically Signed   By: Lorriane Shire M.D.  On: 02/02/2017 10:46    Assessment/Plan There are no diagnoses linked to this encounter.   Family/ staff Communication:   Labs/tests ordered:

## 2017-02-09 NOTE — Progress Notes (Signed)
Provider:  Veleta Miners Location:   [facility] Room Number: 129/P Place of Service:  SNF (31)  PCP: Tivis Ringer, MD Patient Care Team: Prince Solian, MD as PCP - General (Internal Medicine)  Extended Emergency Contact Information Primary Emergency Contact: Gent,[name] Address: [address] [place] [address] [place] Phone: [phone] Relation: Spouse Secondary Emergency Contact: Clayburn Pert [place] Phone: [phone] Relation: Son  Code Status: DNR Goals of Care: Advanced Directive information Advanced Directives 02/09/2017  Does Patient Have a Medical Advance Directive? Yes  Type of Advance Directive Out of facility DNR (pink MOST or yellow form)  Does patient want to make changes to medical advance directive? No - Patient declined  Copy of [place] in Chart? -  Would patient like information on creating a medical advance directive? No - Patient declined  Pre-existing out of facility DNR order (yellow form or pink MOST form) -      Chief Complaint  Patient presents with  . New Admit To SNF    HPI: Patient is a 83 y.o. female seen today for admission to SNF for therapy after Right hip Hemi Arhtroplasty on  02/03/17. Patient has h/o osteoporosis, Hyperlipidemia, Arrhythmia with LBBB, H/o Lumpectomy for DCIS breast cancer. h/o Left hip surgery in 2013  Patient was working out in [facility] and fell and had right hip pain. In the ED she was found to have right hip Fracture. It was decided to do do Hemi arthroplasty as she had complex displaced fracture. She had good recovery after her surgery except one episode of SOB during working out with therapy which resolved itself. She is discharged to facility for Therapy.  She  is doing well in facility. Has had no episode of SOB. Is walking with the walker with therapist. Pain is well controlled on Robaxin and  Hydrocodone. She lives with her husband and had stairs in her house. She was very independent before surgery.       Past Medical History:  Diagnosis Date  . Arrhythmia    left BBB,    PVC'S  . Cancer ([place]) 10/2013   LEFT BREAST CANCER--LUMPECTOMY  . DDD (degenerative disc disease)   . Diverticular disease    With colonic polyps  . Hiatal hernia   . Hyperlipidemia   . Hypertension     no meds    dr Radene Gunning  . Osteopenia    Past Surgical History:  Procedure Laterality Date  . APPENDECTOMY    . BREAST LUMPECTOMY WITH NEEDLE LOCALIZATION Left 11/25/2013   Procedure: LEFT BREAST WIRE GUIDED LUMPECTOMY;  Surgeon: Rolm Bookbinder, MD;  Location: [facility];  Service: General;  Laterality: Left;  . BREAST SURGERY    . CATARACT EXTRACTION    . HIP ARTHROPLASTY  06/21/2012   Procedure: ARTHROPLASTY BIPOLAR HIP;  Surgeon: Mauri Pole, MD;  Location: [facility];  Service: Orthopedics;  Laterality: Left;  . HIP ARTHROPLASTY Right 02/03/2017   Procedure: ARTHROPLASTY BIPOLAR HIP (HEMIARTHROPLASTY) RIGHT;  Surgeon: Paralee Cancel, MD;  Location: [facility];  Service: Orthopedics;  Laterality: Right;    reports that she has never smoked. She has never used smokeless tobacco. She reports that she does not drink alcohol or use drugs. Social History   Social History  . Marital status: Married    Spouse name: N/A  . Number of children: 2  . Years of education: N/A  Occupational History  . Not on file.   Social History Main Topics  . Smoking status: Never Smoker  . Smokeless tobacco: Never Used  . Alcohol use No  . Drug use: No  . Sexual activity: Yes   Other Topics Concern  . Not on file   Social History Narrative   Lives with husband.      Functional Status Survey:    Family History  Problem Relation Age of Onset  . Heart Problems Mother     Died age 43, no details/Died age 18, no details  . CAD Brother 28  . CAD Brother 72    Health Maintenance  Topic Date Due  . Samul Dada   08/23/1949  . PNA vac Low Risk Adult (1 of 2 - PCV13) 09/26/2017 (Originally 08/22/1997)  . INFLUENZA VACCINE  05/27/2017  . DEXA SCAN  Completed    No Known Allergies  Outpatient Encounter Prescriptions as of 02/09/2017  Medication Sig  . acetaminophen (TYLENOL) 500 MG tablet Take 500 mg by mouth every 6 (six) hours as needed.  Marland Kitchen aspirin 81 MG chewable tablet Chew 1 tablet (81 mg total) by mouth 2 (two) times daily. Take for 4 weeks, then resume regular dose.  . Calcium Carbonate-Vitamin D (CALCIUM PLUS VITAMIN D PO) Take by mouth daily.  Marland Kitchen donepezil (ARICEPT) 10 MG tablet Take 10 mg by mouth every evening.  . ferrous sulfate 325 (65 FE) MG tablet Take 1 tablet (325 mg total) by mouth 3 (three) times daily after meals. For 2-3 weeks.  Marland Kitchen HYDROcodone-acetaminophen (NORCO/VICODIN) 5-325 MG tablet Take 1-2 tablets by mouth every 6 (six) hours as needed for moderate pain.  . methocarbamol (ROBAXIN) 500 MG tablet Take 1 tablet (500 mg total) by mouth every 6 (six) hours as needed for muscle spasms.  . polyethylene glycol (MIRALAX / GLYCOLAX) packet Take 17 g by mouth daily.  Marland Kitchen senna (SENOKOT) 8.6 MG TABS tablet Take 1 tablet (8.6 mg total) by mouth daily.  . simvastatin (ZOCOR) 20 MG tablet Take 20 mg by mouth daily.    No facility-administered encounter medications on file as of 02/09/2017.      Review of Systems  Review of Systems  Constitutional: Negative for activity change, appetite change, chills, diaphoresis, fatigue and fever.  HENT: Negative for mouth sores, postnasal drip, rhinorrhea, sinus pain and sore throat.   Respiratory: Negative for apnea, cough, chest tightness, shortness of breath and wheezing.   Cardiovascular: Negative for chest pain, palpitations and leg swelling.  Gastrointestinal: Negative for abdominal distention, abdominal pain, constipation, diarrhea, nausea and vomiting.  Genitourinary: Negative for dysuria and frequency.  Musculoskeletal: Negative for  arthralgias, joint swelling and myalgias.  Skin: Negative for rash.  Neurological: Negative for dizziness, syncope, weakness, light-headedness and numbness.  Psychiatric/Behavioral: Negative for behavioral problems, confusion and sleep disturbance.     Vitals:   02/09/17 1018  BP: 109/62  Pulse: 60  Resp: 20  Temp: 97.5 F (36.4 C)  TempSrc: Oral   There is no height or weight on file to calculate BMI. Physical Exam  Constitutional: She is oriented to person, place, and time. She appears well-developed and well-nourished.  HENT:  Head: Normocephalic.  Mouth/Throat: Oropharynx is clear and moist.  Eyes: Pupils are equal, round, and reactive to light.  Neck: Neck supple.  Cardiovascular: Normal rate and normal heart sounds.  An irregular rhythm present.  No murmur heard. Pulmonary/Chest: Effort normal and breath sounds normal. No respiratory distress. She has no wheezes. She has  no rales.  Abdominal: Soft. Bowel sounds are normal. She exhibits no distension. There is no tenderness. There is no rebound and no guarding.  Musculoskeletal: She exhibits no edema.  Neurological: She is alert and oriented to person, place, and time.  Had no Focal deficit .   Skin: Skin is warm and dry. No rash noted. No erythema.  Psychiatric: She has a normal mood and affect. Her behavior is normal. Thought content normal.    Labs reviewed: Basic Metabolic Panel:  Recent Labs  02/03/17 0450 02/04/17 0437 02/05/17 0454  NA 139 137 141  K 4.3 4.4 4.2  CL 109 107 108  CO2 26 24 28   GLUCOSE 97 138* 87  BUN 19 17 17   CREATININE 1.00 0.88 0.92  CALCIUM 8.7* 8.5* 8.8*   Liver Function Tests: No results for input(s): AST, ALT, ALKPHOS, BILITOT, PROT, ALBUMIN in the last 8760 hours. No results for input(s): LIPASE, AMYLASE in the last 8760 hours. No results for input(s): AMMONIA in the last 8760 hours. CBC:  Recent Labs  02/02/17 1104 02/03/17 0450 02/04/17 0437 02/05/17 0454  WBC 11.1*  8.6 10.8* 7.9  NEUTROABS 8.9*  --   --   --   HGB 14.0 12.3 11.6* 11.2*  HCT 40.8 36.5 33.4* 32.8*  MCV 91.3 92.9 91.5 92.7  PLT 214 180 181 170   Cardiac Enzymes: No results for input(s): CKTOTAL, CKMB, CKMBINDEX, TROPONINI in the last 8760 hours. BNP: Invalid input(s): POCBNP No results found for: HGBA1C No results found for: TSH No results found for: VITAMINB12 No results found for: FOLATE No results found for: IRON, TIBC, FERRITIN  Imaging and Procedures obtained prior to SNF admission: Dg Chest Port 1 View  Result Date: 02/05/2017 CLINICAL DATA:  83 year old female with a history of hypoxia status post surgery EXAM: PORTABLE CHEST 1 VIEW COMPARISON:  02/02/2017 FINDINGS: Cardiomediastinal silhouette unchanged. Double density at the inferior mediastinum. Calcifications of the aortic arch. Opacity at the left costophrenic angle. No pneumothorax.  Right lung relatively clear. IMPRESSION: Ill-defined opacity at the left base at the costophrenic angle, potentially atelectasis or consolidation. If there is concern for acute process, at least a formal PA and lateral chest x-ray would be recommended, or alternatively chest CT. Aortic atherosclerosis. Hiatal hernia. Electronically Signed   By: Corrie Mckusick D.O.   On: 02/05/2017 13:34    Assessment/Plan Closed fracture of right hip Status post Hemiarthroplasty Patient on Aspirin. Doing well with therapy.  Pain well controlled. Follow up with orthopedics   Essential hypertension BP stable. Not on any Meds.  Hyperlipidemia,  Continue Zocor  Possible dementia Was started on Aricept by her PCP for some memory issues. Anemia' Stable Hgb Follow hgb  Family/ staff Communication:   Labs/tests ordered: CBC and BMP

## 2017-02-09 NOTE — Telephone Encounter (Signed)
[facility] #[phone] Fax: [phone]   

## 2017-02-20 ENCOUNTER — Non-Acute Institutional Stay (SKILLED_NURSING_FACILITY): Payer: Medicare HMO | Admitting: Internal Medicine

## 2017-02-20 ENCOUNTER — Encounter: Payer: Self-pay | Admitting: Internal Medicine

## 2017-02-20 DIAGNOSIS — S72001S Fracture of unspecified part of neck of right femur, sequela: Secondary | ICD-10-CM | POA: Diagnosis not present

## 2017-02-20 DIAGNOSIS — D509 Iron deficiency anemia, unspecified: Secondary | ICD-10-CM | POA: Diagnosis not present

## 2017-02-20 DIAGNOSIS — I10 Essential (primary) hypertension: Secondary | ICD-10-CM

## 2017-02-20 DIAGNOSIS — F0390 Unspecified dementia without behavioral disturbance: Secondary | ICD-10-CM | POA: Diagnosis not present

## 2017-02-20 DIAGNOSIS — M810 Age-related osteoporosis without current pathological fracture: Secondary | ICD-10-CM | POA: Diagnosis not present

## 2017-02-20 NOTE — Progress Notes (Signed)
Location:   [facility] Room Number: 129/P Place of Service:  SNF (31)  Provider: Granville Lewis  PCP: Tivis Ringer, MD Patient Care Team: Prince Solian, MD as PCP - General (Internal Medicine)  Extended Emergency Contact Information Primary Emergency Contact: Passarella,[name] Address: [address] [place] [address] [place] Phone: [phone] Relation: Spouse Secondary Emergency Contact: Clayburn Pert [place] Phone: [phone] Relation: Son  Code Status: DNR Goals of care:  Advanced Directive information Advanced Directives 02/20/2017  Does Patient Have a Medical Advance Directive? Yes  Type of Advance Directive Out of facility DNR (pink MOST or yellow form)  Does patient want to make changes to medical advance directive? No - Patient declined  Copy of [place] in Chart? -  Would patient like information on creating a medical advance directive? No - Patient declined  Pre-existing out of facility DNR order (yellow form or pink MOST form) -     No Known Allergies  Chief Complaint  Patient presents with  . Discharge Note    HPI:  83 y.o. female  seen today for discharge from facility tomorrow.  She is here for rehabilitation after sustaining a right hip fracture that was surgically repaired-postop course appears to be fairly unremarkable she did have some postop anemia she is on iron hemoglobin appears to be stabilized at 12.6 on lab done on 02/09/2017 will have home health update this in primary care provider notified of results when she goes home  She is on low-dose aspirin twice a day for anticoagulation for 4 weeks which will be reduced once a day after the 4 week course is complete.  She is not complaining of pain currently she does receive Norco as needed as well as Robaxin and apparently this is giving relief.  She does have a history of osteoporosis she is on  calcium with vitamin D.  Her other medical issues appear to be stable she does have dementia which appears to be quite mild she is on Aricept also a history of hypertension which has been stable on no meds recent blood pressures 123/62-116/74.  Currently she has no complaints she will be going home with her spouse who is with her a considerable amount of time-he is very supportive they also have help at home from their daughter.  She will need continued PT and OT for strengthening as well as nursing support for her multiple medical issues    Past Medical History:  Diagnosis Date  . Arrhythmia    left BBB,    PVC'S  . Cancer ([place]) 10/2013   LEFT BREAST CANCER--LUMPECTOMY  . DDD (degenerative disc disease)   . Diverticular disease    With colonic polyps  . Hiatal hernia   . Hyperlipidemia   . Hypertension     no meds    dr Radene Gunning  . Osteopenia     Past Surgical History:  Procedure Laterality Date  . APPENDECTOMY    . BREAST LUMPECTOMY WITH NEEDLE LOCALIZATION Left 11/25/2013   Procedure: LEFT BREAST WIRE GUIDED LUMPECTOMY;  Surgeon: Rolm Bookbinder, MD;  Location: [facility];  Service: General;  Laterality: Left;  . BREAST SURGERY    . CATARACT EXTRACTION    . HIP ARTHROPLASTY  06/21/2012   Procedure: ARTHROPLASTY BIPOLAR HIP;  Surgeon: Mauri Pole, MD;  [facility];  Service: Orthopedics;  Laterality: Left;  . HIP ARTHROPLASTY Right  02/03/2017   Procedure: ARTHROPLASTY BIPOLAR HIP (HEMIARTHROPLASTY) RIGHT;  Surgeon: Paralee Cancel, MD;  [facility];  Service: Orthopedics;  Laterality: Right;      reports that she has never smoked. She has never used smokeless tobacco. She reports that she does not drink alcohol or use drugs. Social History   Social History  . Marital status: Married    Spouse name: N/A  . Number of children: 2  . Years of education: N/A   Occupational History  . Not on file.   Social History Main Topics  . Smoking status: Never Smoker  . Smokeless  tobacco: Never Used  . Alcohol use No  . Drug use: No  . Sexual activity: Yes   Other Topics Concern  . Not on file   Social History Narrative   Lives with husband.     Functional Status Survey:    No Known Allergies  Pertinent  Health Maintenance Due  Topic Date Due  . PNA vac Low Risk Adult (1 of 2 - PCV13) 09/26/2017 (Originally 08/22/1997)  . INFLUENZA VACCINE  05/27/2017  . DEXA SCAN  Completed    Medications: Outpatient Encounter Prescriptions as of 02/20/2017  Medication Sig  . acetaminophen (TYLENOL) 500 MG tablet Take 500 mg by mouth every 6 (six) hours as needed.  Marland Kitchen aspirin 81 MG chewable tablet Chew 1 tablet (81 mg total) by mouth 2 (two) times daily. Take for 4 weeks, then resume regular dose.  . Calcium Carbonate-Vitamin D (CALCIUM PLUS VITAMIN D PO) Take by mouth daily.  Marland Kitchen donepezil (ARICEPT) 10 MG tablet Take 10 mg by mouth every evening.  . ferrous sulfate 325 (65 FE) MG tablet Take 1 tablet (325 mg total) by mouth 3 (three) times daily after meals. For 2-3 weeks.  Marland Kitchen HYDROcodone-acetaminophen (NORCO/VICODIN) 5-325 MG tablet Take one to two tablets by mouth every 6 hours as needed for moderate to severe pain. DNE 3gms/24hrs from all sources  . methocarbamol (ROBAXIN) 500 MG tablet Take 1 tablet (500 mg total) by mouth every 6 (six) hours as needed for muscle spasms.  . polyethylene glycol (MIRALAX / GLYCOLAX) packet Take 17 g by mouth daily.  Marland Kitchen senna (SENOKOT) 8.6 MG TABS tablet Take 1 tablet (8.6 mg total) by mouth daily.  . simvastatin (ZOCOR) 20 MG tablet Take 20 mg by mouth daily.    No facility-administered encounter medications on file as of 02/20/2017.      Review of Systems   In general is not complaining of any fever or chills.  Skin is not complaining of rashes or itching surgical site is currently covered.  Head ears eyes nose mouth and throat does not complaining of any sore throat or visual changes.  Respiratory denies shortness breath or  cough did have hypoxia while working with therapy in the hospital but does not complaining of any shortness of breath recently.  Cardiac does not complaining of any chest pain palpitations or edema.  GI is not complaining of any nausea vomiting diarrhea constipation or abdominal discomfort.  GU does not complain of dysuria.  Muscle skeletal says any hip pain appears to be controlled Norco and Robaxin are helping.  Neurologic is not complaining of dizziness headache or numbness.  Psych does have some history of dementia but this appears to be quite mild appears to be in very good spirits certainly no complaints of depression or anxiety .     T- 96.7 pulse 61 respirations 20 blood pressure 123/62 weight is 128.4  Physical Exam In general this is a very pleasant somewhat frail appearing elderly female in no distress sitting comfortably in her wheelchair.  Her skin is warm and dry.  Right hip fracture site appears quite benign with well healing surgical scar no drainage bleeding or surrounding erythema  Eyes sclera and conjunctiva clear visual acuity appears grossly intact.  Oropharynx clear. Membranes moist.  Chest is clear to auscultation there is no labored breathing.  Heart is regular rate and rhythm without murmur gallop or rub she does not really have significant lower extremity edema pedal pulses are intact.  Abdomen is soft nontender positive bowel sounds.  Muscle skeletal is able to stand  Without assistance and use her walker somewhat frail would benefit from continued therapy  Neurologic is grossly intact her speech is clear no lateralizing findings.  Psych she appears alert and oriented pleasant and appropriate appear to be an accurate historian very pleasant individual. Labs reviewed: Basic Metabolic Panel:  Recent Labs  02/04/17 0437 02/05/17 0454 02/09/17 1030  NA 137 141 136  K 4.4 4.2 4.5  CL 107 108 99*  CO2 24 28 31   GLUCOSE 138*  87 98  BUN 17 17 18   CREATININE 0.88 0.92 0.79  CALCIUM 8.5* 8.8* 9.3   Liver Function Tests: No results for input(s): AST, ALT, ALKPHOS, BILITOT, PROT, ALBUMIN in the last 8760 hours. No results for input(s): LIPASE, AMYLASE in the last 8760 hours. No results for input(s): AMMONIA in the last 8760 hours. CBC:  Recent Labs  02/02/17 1104  02/04/17 0437 02/05/17 0454 02/09/17 1030  WBC 11.1*  < > 10.8* 7.9 9.3  NEUTROABS 8.9*  --   --   --   --   HGB 14.0  < > 11.6* 11.2* 12.6  HCT 40.8  < > 33.4* 32.8* 37.7  MCV 91.3  < > 91.5 92.7 93.5  PLT 214  < > 181 170 260  < > = values in this interval not displayed. Cardiac Enzymes: No results for input(s): CKTOTAL, CKMB, CKMBINDEX, TROPONINI in the last 8760 hours. BNP: Invalid input(s): POCBNP CBG: No results for input(s): GLUCAP in the last 8760 hours.  Procedures and Imaging Studies During Stay: Pelvis Portable  Result Date: 02/03/2017 CLINICAL DATA:  Status post right hip replacement. EXAM: PORTABLE PELVIS 1-2 VIEWS COMPARISON:  June 21, 2012 FINDINGS: The patient is status post right hip replacement in the interval. Acetabular and femoral components are in good position. No other abnormalities. IMPRESSION: Status post right hip replacement as above. Electronically Signed   By: Dorise Bullion III M.D   On: 02/03/2017 13:43   Dg Chest Port 1 View  Result Date: 02/05/2017 CLINICAL DATA:  83 year old female with a history of hypoxia status post surgery EXAM: PORTABLE CHEST 1 VIEW COMPARISON:  02/02/2017 FINDINGS: Cardiomediastinal silhouette unchanged. Double density at the inferior mediastinum. Calcifications of the aortic arch. Opacity at the left costophrenic angle. No pneumothorax.  Right lung relatively clear. IMPRESSION: Ill-defined opacity at the left base at the costophrenic angle, potentially atelectasis or consolidation. If there is concern for acute process, at least a formal PA and lateral chest x-ray would be  recommended, or alternatively chest CT. Aortic atherosclerosis. Hiatal hernia. Electronically Signed   By: Corrie Mckusick D.O.   On: 02/05/2017 13:34   Dg Chest Port 1 View  Result Date: 02/02/2017 CLINICAL DATA:  Pre operative respiratory exam.  Right hip fracture. EXAM: PORTABLE CHEST 1 VIEW COMPARISON:  11/23/2013 FINDINGS: Heart size and pulmonary  vascularity are normal. Large chronic hiatal hernia. Lungs are clear. IMPRESSION: Large chronic hiatal hernia.  No acute abnormality. Electronically Signed   By: Lorriane Shire M.D.   On: 02/02/2017 11:14   Dg Hip Unilat W Or Wo Pelvis 2-3 Views Right  Result Date: 02/02/2017 CLINICAL DATA:  Right hip pain secondary to a fall at home this morning. EXAM: DG HIP (WITH OR WITHOUT PELVIS) 2-3V RIGHT COMPARISON:  Radiographs dated 06/21/2012 and 06/20/2012 FINDINGS: There is a slightly impacted subcapital fracture of the right femoral neck. Pelvic bones are intact. Proximal left femoral prosthesis in place. IMPRESSION: Right femoral neck fracture. Electronically Signed   By: Lorriane Shire M.D.   On: 02/02/2017 10:46    Assessment//Plan  #1-history of right hip fracture with repair she appears to be doing well with this she is on aspirin twice a day for 4 weeks to be reduced to once a day after that-she has follow-up with orthopedics she is ambulating now with a walker but would benefit from continued PT and OT-she has Norco as well as Robaxin when necessary this appears to help with her pain and muscle spasms at this point will monitor will need home health nursing support in addition to the therapy.  #2 osteoporosis with history of fractures as noted above she is on calcium with vitamin D.  #3 hypertension appears stable currently on no medications.  #4 dementia this appears to be quite mild she is on Aricept.  #5 history of anemia postop hemoglobin appears stable at 12.6 she is on iron Will have this updated next week by home health and primary care  provider notified of results.  History of hyperlipidemia she is on Zocor since her stay here was short will defer to primary care provider for follow-up.  #7 history of breast cancer on the left-this is followed by oncology and thought to be stable status post lumpectomy and radiation  Again she will be going home with her spouse who is very supportive also has support from her daughter will need continued PT and OT for strengthening as well as nursing support to follow-up her medical issues.    [id number] note greater than 30 minutes spent on this discharge summary-greater than 50% of time spent coordinating plan of care for numerous diagnoses

## 2017-02-20 NOTE — Progress Notes (Deleted)
Location:   [facility] Room Number: 129/P Place of Service:  SNF (31) Provider:  Robyn Haber, MD  Patient Care Team: Prince Solian, MD as PCP - General (Internal Medicine)  Extended Emergency Contact Information Primary Emergency Contact: Baldree,[name] Address: [address] [place] [address] Johnnette Litter of [place] Phone: [phone] Relation: Spouse Secondary Emergency Contact: Clayburn Pert [place] of [place] Phone: [phone] Relation: Son  Code Status:  DNR Goals of care: Advanced Directive information Advanced Directives 02/20/2017  Does Patient Have a Medical Advance Directive? Yes  Type of Advance Directive Out of facility DNR (pink MOST or yellow form)  Does patient want to make changes to medical advance directive? No - Patient declined  Copy of [place] in Chart? -  Would patient like information on creating a medical advance directive? No - Patient declined  Pre-existing out of facility DNR order (yellow form or pink MOST form) -     Chief Complaint  Patient presents with  . Discharge Note    HPI:  Pt is a 83 y.o. female seen today for an acute visit for    Past Medical History:  Diagnosis Date  . Arrhythmia    left BBB,    PVC'S  . Cancer ([facility]) 10/2013   LEFT BREAST CANCER--LUMPECTOMY  . DDD (degenerative disc disease)   . Diverticular disease    With colonic polyps  . Hiatal hernia   . Hyperlipidemia   . Hypertension     no meds    dr Radene Gunning  . Osteopenia    Past Surgical History:  Procedure Laterality Date  . APPENDECTOMY    . BREAST LUMPECTOMY WITH NEEDLE LOCALIZATION Left 11/25/2013   Procedure: LEFT BREAST WIRE GUIDED LUMPECTOMY;  Surgeon: Rolm Bookbinder, MD;  Location: [facility];  Service: General;  Laterality: Left;  . BREAST SURGERY    . CATARACT EXTRACTION    . HIP ARTHROPLASTY  06/21/2012   Procedure: ARTHROPLASTY BIPOLAR HIP;  Surgeon:  Mauri Pole, MD;  Location: [facility];  Service: Orthopedics;  Laterality: Left;  . HIP ARTHROPLASTY Right 02/03/2017   Procedure: ARTHROPLASTY BIPOLAR HIP (HEMIARTHROPLASTY) RIGHT;  Surgeon: Paralee Cancel, MD;  Location: [facility];  Service: Orthopedics;  Laterality: Right;    No Known Allergies  Outpatient Encounter Prescriptions as of 02/20/2017  Medication Sig  . acetaminophen (TYLENOL) 500 MG tablet Take 500 mg by mouth every 6 (six) hours as needed.  Marland Kitchen aspirin 81 MG chewable tablet Chew 1 tablet (81 mg total) by mouth 2 (two) times daily. Take for 4 weeks, then resume regular dose.  . Calcium Carbonate-Vitamin D (CALCIUM PLUS VITAMIN D PO) Take by mouth daily.  Marland Kitchen donepezil (ARICEPT) 10 MG tablet Take 10 mg by mouth every evening.  . ferrous sulfate 325 (65 FE) MG tablet Take 1 tablet (325 mg total) by mouth 3 (three) times daily after meals. For 2-3 weeks.  Marland Kitchen HYDROcodone-acetaminophen (NORCO/VICODIN) 5-325 MG tablet Take one to two tablets by mouth every 6 hours as needed for moderate to severe pain. DNE 3gms/24hrs from all sources  . methocarbamol (ROBAXIN) 500 MG tablet Take 1 tablet (500 mg total) by mouth every 6 (six) hours as needed for muscle spasms.  . polyethylene glycol (MIRALAX / GLYCOLAX) packet Take 17 g by mouth daily.  Marland Kitchen senna (SENOKOT) 8.6 MG TABS tablet Take 1 tablet (8.6 mg total) by mouth daily.  Marland Kitchen  simvastatin (ZOCOR) 20 MG tablet Take 20 mg by mouth daily.    No facility-administered encounter medications on file as of 02/20/2017.     Review of Systems   There is no immunization history on file for this patient. Pertinent  Health Maintenance Due  Topic Date Due  . PNA vac Low Risk Adult (1 of 2 - PCV13) 09/26/2017 (Originally 08/22/1997)  . INFLUENZA VACCINE  05/27/2017  . DEXA SCAN  Completed   No flowsheet data found. Functional Status Survey:    There were no vitals filed for this visit. There is no height or weight on file to calculate BMI. Physical  Exam  Labs reviewed:  Recent Labs  02/04/17 0437 02/05/17 0454 02/09/17 1030  NA 137 141 136  K 4.4 4.2 4.5  CL 107 108 99*  CO2 24 28 31   GLUCOSE 138* 87 98  BUN 17 17 18   CREATININE 0.88 0.92 0.79  CALCIUM 8.5* 8.8* 9.3   No results for input(s): AST, ALT, ALKPHOS, BILITOT, PROT, ALBUMIN in the last 8760 hours.  Recent Labs  02/02/17 1104  02/04/17 0437 02/05/17 0454 02/09/17 1030  WBC 11.1*  < > 10.8* 7.9 9.3  NEUTROABS 8.9*  --   --   --   --   HGB 14.0  < > 11.6* 11.2* 12.6  HCT 40.8  < > 33.4* 32.8* 37.7  MCV 91.3  < > 91.5 92.7 93.5  PLT 214  < > 181 170 260  < > = values in this interval not displayed. No results found for: TSH No results found for: HGBA1C No results found for: CHOL, HDL, LDLCALC, LDLDIRECT, TRIG, CHOLHDL  Significant Diagnostic Results in last 30 days:  Pelvis Portable  Result Date: 02/03/2017 CLINICAL DATA:  Status post right hip replacement. EXAM: PORTABLE PELVIS 1-2 VIEWS COMPARISON:  June 21, 2012 FINDINGS: The patient is status post right hip replacement in the interval. Acetabular and femoral components are in good position. No other abnormalities. IMPRESSION: Status post right hip replacement as above. Electronically Signed   By: Dorise Bullion III M.D   On: 02/03/2017 13:43   Dg Chest Port 1 View  Result Date: 02/05/2017 CLINICAL DATA:  83 year old female with a history of hypoxia status post surgery EXAM: PORTABLE CHEST 1 VIEW COMPARISON:  02/02/2017 FINDINGS: Cardiomediastinal silhouette unchanged. Double density at the inferior mediastinum. Calcifications of the aortic arch. Opacity at the left costophrenic angle. No pneumothorax.  Right lung relatively clear. IMPRESSION: Ill-defined opacity at the left base at the costophrenic angle, potentially atelectasis or consolidation. If there is concern for acute process, at least a formal PA and lateral chest x-ray would be recommended, or alternatively chest CT. Aortic atherosclerosis.  Hiatal hernia. Electronically Signed   By: Corrie Mckusick D.O.   On: 02/05/2017 13:34   Dg Chest Port 1 View  Result Date: 02/02/2017 CLINICAL DATA:  Pre operative respiratory exam.  Right hip fracture. EXAM: PORTABLE CHEST 1 VIEW COMPARISON:  11/23/2013 FINDINGS: Heart size and pulmonary vascularity are normal. Large chronic hiatal hernia. Lungs are clear. IMPRESSION: Large chronic hiatal hernia.  No acute abnormality. Electronically Signed   By: Lorriane Shire M.D.   On: 02/02/2017 11:14   Dg Hip Unilat W Or Wo Pelvis 2-3 Views Right  Result Date: 02/02/2017 CLINICAL DATA:  Right hip pain secondary to a fall at home this morning. EXAM: DG HIP (WITH OR WITHOUT PELVIS) 2-3V RIGHT COMPARISON:  Radiographs dated 06/21/2012 and 06/20/2012 FINDINGS: There is a slightly  impacted subcapital fracture of the right femoral neck. Pelvic bones are intact. Proximal left femoral prosthesis in place. IMPRESSION: Right femoral neck fracture. Electronically Signed   By: Lorriane Shire M.D.   On: 02/02/2017 10:46    Assessment/Plan There are no diagnoses linked to this encounter.      Sarah Moyer, [place]

## 2017-02-22 DIAGNOSIS — Z96641 Presence of right artificial hip joint: Secondary | ICD-10-CM | POA: Diagnosis not present

## 2017-02-22 DIAGNOSIS — Z96642 Presence of left artificial hip joint: Secondary | ICD-10-CM | POA: Diagnosis not present

## 2017-02-22 DIAGNOSIS — M519 Unspecified thoracic, thoracolumbar and lumbosacral intervertebral disc disorder: Secondary | ICD-10-CM | POA: Diagnosis not present

## 2017-02-22 DIAGNOSIS — Z7982 Long term (current) use of aspirin: Secondary | ICD-10-CM | POA: Diagnosis not present

## 2017-02-22 DIAGNOSIS — M80051D Age-related osteoporosis with current pathological fracture, right femur, subsequent encounter for fracture with routine healing: Secondary | ICD-10-CM | POA: Diagnosis not present

## 2017-02-22 DIAGNOSIS — I447 Left bundle-branch block, unspecified: Secondary | ICD-10-CM | POA: Diagnosis not present

## 2017-02-22 DIAGNOSIS — I10 Essential (primary) hypertension: Secondary | ICD-10-CM | POA: Diagnosis not present

## 2017-02-22 DIAGNOSIS — Z853 Personal history of malignant neoplasm of breast: Secondary | ICD-10-CM | POA: Diagnosis not present

## 2017-02-22 DIAGNOSIS — Z9181 History of falling: Secondary | ICD-10-CM | POA: Diagnosis not present

## 2017-02-23 DIAGNOSIS — Z96642 Presence of left artificial hip joint: Secondary | ICD-10-CM | POA: Diagnosis not present

## 2017-02-23 DIAGNOSIS — Z7982 Long term (current) use of aspirin: Secondary | ICD-10-CM | POA: Diagnosis not present

## 2017-02-23 DIAGNOSIS — Z9181 History of falling: Secondary | ICD-10-CM | POA: Diagnosis not present

## 2017-02-23 DIAGNOSIS — I447 Left bundle-branch block, unspecified: Secondary | ICD-10-CM | POA: Diagnosis not present

## 2017-02-23 DIAGNOSIS — M80051D Age-related osteoporosis with current pathological fracture, right femur, subsequent encounter for fracture with routine healing: Secondary | ICD-10-CM | POA: Diagnosis not present

## 2017-02-23 DIAGNOSIS — M519 Unspecified thoracic, thoracolumbar and lumbosacral intervertebral disc disorder: Secondary | ICD-10-CM | POA: Diagnosis not present

## 2017-02-23 DIAGNOSIS — Z853 Personal history of malignant neoplasm of breast: Secondary | ICD-10-CM | POA: Diagnosis not present

## 2017-02-23 DIAGNOSIS — Z96641 Presence of right artificial hip joint: Secondary | ICD-10-CM | POA: Diagnosis not present

## 2017-02-23 DIAGNOSIS — I10 Essential (primary) hypertension: Secondary | ICD-10-CM | POA: Diagnosis not present

## 2017-02-24 DIAGNOSIS — I10 Essential (primary) hypertension: Secondary | ICD-10-CM | POA: Diagnosis not present

## 2017-02-24 DIAGNOSIS — M80051D Age-related osteoporosis with current pathological fracture, right femur, subsequent encounter for fracture with routine healing: Secondary | ICD-10-CM | POA: Diagnosis not present

## 2017-02-24 DIAGNOSIS — M519 Unspecified thoracic, thoracolumbar and lumbosacral intervertebral disc disorder: Secondary | ICD-10-CM | POA: Diagnosis not present

## 2017-02-24 DIAGNOSIS — I447 Left bundle-branch block, unspecified: Secondary | ICD-10-CM | POA: Diagnosis not present

## 2017-02-24 DIAGNOSIS — Z7982 Long term (current) use of aspirin: Secondary | ICD-10-CM | POA: Diagnosis not present

## 2017-02-24 DIAGNOSIS — Z96642 Presence of left artificial hip joint: Secondary | ICD-10-CM | POA: Diagnosis not present

## 2017-02-24 DIAGNOSIS — Z853 Personal history of malignant neoplasm of breast: Secondary | ICD-10-CM | POA: Diagnosis not present

## 2017-02-24 DIAGNOSIS — Z9181 History of falling: Secondary | ICD-10-CM | POA: Diagnosis not present

## 2017-02-24 DIAGNOSIS — Z96641 Presence of right artificial hip joint: Secondary | ICD-10-CM | POA: Diagnosis not present

## 2017-02-25 DIAGNOSIS — M519 Unspecified thoracic, thoracolumbar and lumbosacral intervertebral disc disorder: Secondary | ICD-10-CM | POA: Diagnosis not present

## 2017-02-25 DIAGNOSIS — Z96641 Presence of right artificial hip joint: Secondary | ICD-10-CM | POA: Diagnosis not present

## 2017-02-25 DIAGNOSIS — M80051D Age-related osteoporosis with current pathological fracture, right femur, subsequent encounter for fracture with routine healing: Secondary | ICD-10-CM | POA: Diagnosis not present

## 2017-02-25 DIAGNOSIS — Z9181 History of falling: Secondary | ICD-10-CM | POA: Diagnosis not present

## 2017-02-25 DIAGNOSIS — Z96642 Presence of left artificial hip joint: Secondary | ICD-10-CM | POA: Diagnosis not present

## 2017-02-25 DIAGNOSIS — I447 Left bundle-branch block, unspecified: Secondary | ICD-10-CM | POA: Diagnosis not present

## 2017-02-25 DIAGNOSIS — Z7982 Long term (current) use of aspirin: Secondary | ICD-10-CM | POA: Diagnosis not present

## 2017-02-25 DIAGNOSIS — Z853 Personal history of malignant neoplasm of breast: Secondary | ICD-10-CM | POA: Diagnosis not present

## 2017-02-25 DIAGNOSIS — I10 Essential (primary) hypertension: Secondary | ICD-10-CM | POA: Diagnosis not present

## 2017-02-26 DIAGNOSIS — M80051D Age-related osteoporosis with current pathological fracture, right femur, subsequent encounter for fracture with routine healing: Secondary | ICD-10-CM | POA: Diagnosis not present

## 2017-02-26 DIAGNOSIS — Z9181 History of falling: Secondary | ICD-10-CM | POA: Diagnosis not present

## 2017-02-26 DIAGNOSIS — Z853 Personal history of malignant neoplasm of breast: Secondary | ICD-10-CM | POA: Diagnosis not present

## 2017-02-26 DIAGNOSIS — I10 Essential (primary) hypertension: Secondary | ICD-10-CM | POA: Diagnosis not present

## 2017-02-26 DIAGNOSIS — I447 Left bundle-branch block, unspecified: Secondary | ICD-10-CM | POA: Diagnosis not present

## 2017-02-26 DIAGNOSIS — Z7982 Long term (current) use of aspirin: Secondary | ICD-10-CM | POA: Diagnosis not present

## 2017-02-26 DIAGNOSIS — Z96642 Presence of left artificial hip joint: Secondary | ICD-10-CM | POA: Diagnosis not present

## 2017-02-26 DIAGNOSIS — Z96641 Presence of right artificial hip joint: Secondary | ICD-10-CM | POA: Diagnosis not present

## 2017-02-26 DIAGNOSIS — M519 Unspecified thoracic, thoracolumbar and lumbosacral intervertebral disc disorder: Secondary | ICD-10-CM | POA: Diagnosis not present

## 2017-02-27 DIAGNOSIS — D509 Iron deficiency anemia, unspecified: Secondary | ICD-10-CM | POA: Diagnosis not present

## 2017-02-27 DIAGNOSIS — I10 Essential (primary) hypertension: Secondary | ICD-10-CM | POA: Diagnosis not present

## 2017-03-02 DIAGNOSIS — M519 Unspecified thoracic, thoracolumbar and lumbosacral intervertebral disc disorder: Secondary | ICD-10-CM | POA: Diagnosis not present

## 2017-03-02 DIAGNOSIS — I10 Essential (primary) hypertension: Secondary | ICD-10-CM | POA: Diagnosis not present

## 2017-03-02 DIAGNOSIS — Z9181 History of falling: Secondary | ICD-10-CM | POA: Diagnosis not present

## 2017-03-02 DIAGNOSIS — Z96641 Presence of right artificial hip joint: Secondary | ICD-10-CM | POA: Diagnosis not present

## 2017-03-02 DIAGNOSIS — Z853 Personal history of malignant neoplasm of breast: Secondary | ICD-10-CM | POA: Diagnosis not present

## 2017-03-02 DIAGNOSIS — Z7982 Long term (current) use of aspirin: Secondary | ICD-10-CM | POA: Diagnosis not present

## 2017-03-02 DIAGNOSIS — Z96642 Presence of left artificial hip joint: Secondary | ICD-10-CM | POA: Diagnosis not present

## 2017-03-02 DIAGNOSIS — M80051D Age-related osteoporosis with current pathological fracture, right femur, subsequent encounter for fracture with routine healing: Secondary | ICD-10-CM | POA: Diagnosis not present

## 2017-03-02 DIAGNOSIS — I447 Left bundle-branch block, unspecified: Secondary | ICD-10-CM | POA: Diagnosis not present

## 2017-03-03 ENCOUNTER — Encounter (HOSPITAL_COMMUNITY): Payer: Medicare HMO | Attending: Oncology | Admitting: Oncology

## 2017-03-03 ENCOUNTER — Other Ambulatory Visit (HOSPITAL_COMMUNITY): Payer: Self-pay | Admitting: Internal Medicine

## 2017-03-03 ENCOUNTER — Encounter (HOSPITAL_COMMUNITY): Payer: Self-pay

## 2017-03-03 VITALS — BP 132/63 | HR 59 | Temp 97.8°F | Resp 22 | Wt 125.2 lb

## 2017-03-03 DIAGNOSIS — Z86000 Personal history of in-situ neoplasm of breast: Secondary | ICD-10-CM | POA: Diagnosis not present

## 2017-03-03 DIAGNOSIS — Z170 Estrogen receptor positive status [ER+]: Secondary | ICD-10-CM

## 2017-03-03 DIAGNOSIS — C50412 Malignant neoplasm of upper-outer quadrant of left female breast: Secondary | ICD-10-CM

## 2017-03-03 DIAGNOSIS — D0512 Intraductal carcinoma in situ of left breast: Secondary | ICD-10-CM

## 2017-03-03 DIAGNOSIS — Z9889 Other specified postprocedural states: Secondary | ICD-10-CM

## 2017-03-03 NOTE — Progress Notes (Signed)
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 female is here because of referral by Berneta Sages for DCIS Left breast. Patient has a past medical history significant for arrhythmia, left breast cancer- lumpectomy, DDD, diverticular disease, hyperlipidemia, hypertension, and osteopenia. Patient underwent left breast lumpectomy on 11/25/2013. From 06/26/2015 -- Status post removal and XRT.   Patient has not been following with oncology and needed to re-establish with oncology for follow up of breast cancer. She last saw oncology on 12/10/15 with Dr. Tressie Stalker.   Patient's most recent mammogram on 02/06/16 was negative for any malignancy.   Sarah Moyer presents today for follow up of her breast cancer and re-establishment with oncologist. She states she is feeling well currently.  She recently had right rip hip replacement due to breaking her hip on 02/02/2017. She notes she takes 1 pain pill a day for the pain and does physical therapy. She is currently using a walker to ambulate.   She was scheduled for a routine annual mammogram in April 2018 but missed it due to her hip fracture and problems with mobility.  She denies palpating any new breast masses. Denies chest pain, sob, and abdominal pain.    MEDICAL HISTORY:  Past Medical History:  Diagnosis Date  . Arrhythmia    left BBB,    PVC'S  . Cancer ([facility]) 10/2013   LEFT BREAST CANCER--LUMPECTOMY  . DDD (degenerative disc disease)   . Diverticular disease    With colonic polyps  . Hiatal hernia   . Hyperlipidemia   . Hypertension     no meds    dr Radene Gunning  . Osteopenia     SURGICAL HISTORY: Past Surgical History:  Procedure Laterality Date  . APPENDECTOMY    . BREAST LUMPECTOMY WITH NEEDLE LOCALIZATION Left  11/25/2013   Procedure: LEFT BREAST WIRE GUIDED LUMPECTOMY;  Surgeon: Rolm Bookbinder, MD;  Location: [facility];  Service: General;  Laterality: Left;  . BREAST SURGERY    . CATARACT EXTRACTION    . HIP ARTHROPLASTY  06/21/2012   Procedure: ARTHROPLASTY BIPOLAR HIP;  Surgeon: Mauri Pole, MD;  [facility];  Service: Orthopedics;  Laterality: Left;  . HIP ARTHROPLASTY Right 02/03/2017   Procedure: ARTHROPLASTY BIPOLAR HIP (HEMIARTHROPLASTY) RIGHT;  Surgeon: Paralee Cancel, MD;  [facility];  Service: Orthopedics;  Laterality: Right;    SOCIAL HISTORY: Social History   Social History  . Marital status: Married    Spouse name: N/A  . Number of children: 2  . Years of education: N/A   Occupational History  . Not on file.   Social History Main Topics  . Smoking status: Never Smoker  . Smokeless tobacco: Never Used  . Alcohol use No  . Drug use: No  . Sexual activity: Yes   Other Topics Concern  . Not on file   Social History Narrative   Lives with husband.      FAMILY HISTORY: Family History  Problem Relation Age of Onset  . Heart Problems Mother     Died age 51, no details/Died age 56, no details  . CAD Brother 19  . CAD Brother 67    ALLERGIES:  has No Known Allergies.  MEDICATIONS:  Current Outpatient Prescriptions  Medication Sig Dispense Refill  . acetaminophen (  TYLENOL) 500 MG tablet Take 500 mg by mouth every 6 (six) hours as needed.    Marland Kitchen aspirin 81 MG chewable tablet Chew 1 tablet (81 mg total) by mouth 2 (two) times daily. Take for 4 weeks, then resume regular dose. 60 tablet 0  . Calcium Carbonate-Vitamin D (CALCIUM PLUS VITAMIN D PO) Take by mouth daily.    Marland Kitchen donepezil (ARICEPT) 10 MG tablet Take 10 mg by mouth every evening.    . ferrous sulfate 325 (65 FE) MG tablet Take 1 tablet (325 mg total) by mouth 3 (three) times daily after meals. For 2-3 weeks.  3  . HYDROcodone-acetaminophen (NORCO/VICODIN) 5-325 MG tablet Take one to two tablets by mouth  every 6 hours as needed for moderate to severe pain. DNE 3gms/24hrs from all sources 240 tablet 0  . methocarbamol (ROBAXIN) 500 MG tablet Take 1 tablet (500 mg total) by mouth every 6 (six) hours as needed for muscle spasms. 30 tablet 0  . polyethylene glycol (MIRALAX / GLYCOLAX) packet Take 17 g by mouth daily. 14 each 0  . senna (SENOKOT) 8.6 MG TABS tablet Take 1 tablet (8.6 mg total) by mouth daily. 120 each 0  . simvastatin (ZOCOR) 20 MG tablet Take 20 mg by mouth daily.      No current facility-administered medications for this visit.     Review of Systems  Constitutional: Negative.        Loss of appetite.  HENT: Negative.   Eyes: Negative.   Respiratory: Negative.  Negative for shortness of breath.   Cardiovascular: Negative.  Negative for chest pain.  Gastrointestinal: Negative.  Negative for abdominal pain.  Genitourinary: Negative.   Musculoskeletal: Negative.        Right hip pain associated with her hip replacement   Skin: Negative.   Neurological: Negative.   Endo/Heme/Allergies: Negative.   Psychiatric/Behavioral: Negative.   All other systems reviewed and are negative.  14 point ROS was done and is otherwise as detailed above or in HPI   PHYSICAL EXAMINATION: ECOG PERFORMANCE STATUS: 0 - Asymptomatic  Vitals:   03/03/17 1303  BP: 132/63  Pulse: (!) 59  Resp: (!) 22  Temp: 97.8 F (36.6 C)   Filed Weights   03/03/17 1303  Weight: 125 lb 3.2 oz (56.8 kg)     Physical Exam  Constitutional: She is oriented to person, place, and time and well-developed, well-nourished, and in no distress.  Patient was not able to get on exam table due to recently having right hip replacement  HENT:  Head: Normocephalic and atraumatic.  Eyes: Conjunctivae and EOM are normal. Pupils are equal, round, and reactive to light.  Neck: Normal range of motion. Neck supple.  Cardiovascular: Normal rate, regular rhythm and normal heart sounds.   Pulmonary/Chest: Effort normal  and breath sounds normal.  Abdominal: Soft. Bowel sounds are normal.  Musculoskeletal: Normal range of motion.  Neurological: She is alert and oriented to person, place, and time. Gait normal.  Skin: Skin is warm and dry.  Nursing note and vitals reviewed.     LABORATORY DATA:  I have reviewed the data as listed Lab Results  Component Value Date   WBC 9.3 02/09/2017   HGB 12.6 02/09/2017   HCT 37.7 02/09/2017   MCV 93.5 02/09/2017   PLT 260 02/09/2017   CMP     Component Value Date/Time   NA 136 02/09/2017 1030   NA 143 11/16/2013 1209   K 4.5 02/09/2017 1030   K  4.5 11/16/2013 1209   CL 99 (L) 02/09/2017 1030   CO2 31 02/09/2017 1030   CO2 28 11/16/2013 1209   GLUCOSE 98 02/09/2017 1030   GLUCOSE 94 11/16/2013 1209   BUN 18 02/09/2017 1030   BUN 14.6 11/16/2013 1209   CREATININE 0.79 02/09/2017 1030   CREATININE 0.8 11/16/2013 1209   CALCIUM 9.3 02/09/2017 1030   CALCIUM 9.7 11/16/2013 1209   PROT 6.7 11/16/2013 1209   ALBUMIN 3.8 11/16/2013 1209   AST 14 11/16/2013 1209   ALT 10 11/16/2013 1209   ALKPHOS 48 11/16/2013 1209   BILITOT 0.90 11/16/2013 1209   GFRNONAA >60 02/09/2017 1030   GFRAA >60 02/09/2017 1030     RADIOGRAPHIC STUDIES: I have personally reviewed the radiological images as listed and agreed with the findings in the report. No results found.   2D DIGITAL DIAGNOSTIC BILATERAL MAMMOGRAM WITH CAD AND ADJUNCT TOMO 02/06/2016  IMPRESSION: No evidence of malignancy.  DG HIP (WITH OR WITHOUT PELVIS) 2-3V RIGHT 02/02/2017  IMPRESSION: Right femoral neck fracture.   PORTABLE PELVIS 1-2 VIEWS 02/03/2017   IMPRESSION: Status post right hip replacement as above.    PORTABLE CHEST 1 VIEW 02/05/2017  IMPRESSION: Ill-defined opacity at the left base at the costophrenic angle, potentially atelectasis or consolidation. If there is concern for acute process, at least a formal PA and lateral chest x-ray would be recommended, or alternatively  chest CT.  Aortic atherosclerosis.  Hiatal hernia.     PATHOLOGY:    ASSESSMENT & PLAN:  Cancer Staging Breast cancer of upper-outer quadrant of left female breast ([place]) Staging form: Breast, AJCC 7th Edition - Clinical: Stage 0 ([place] (DCIS), N0, cM0) - Unsigned Staging comments: Staged at breast conference 11/16/13  - Pathologic: No stage assigned - Unsigned  No problem-specific Assessment & Plan notes found for this encounter.  Patient is doing well. Patient's most recent mammogram on 02/06/16 was negative for any malignancy.   I have rescheduled her bilateral screening mammogram for next month when her mobility will be improved, here at [facility]. I will call her if I note any abnormalities on her mammogram concerning for malignancy.  RTC in 1 year for follow up.   ORDERS PLACED FOR THIS ENCOUNTER: Orders Placed This Encounter  Procedures  . MM Digital Screening      All questions were answered. The patient knows to call the clinic with any problems, questions or concerns.  This document serves as a record of services personally performed by Twana First, MD. It was created on her behalf by Shirlean Mylar, a trained medical scribe. The creation of this record is based on the scribe's personal observations and the provider's statements to them. This document has been checked and approved by the attending provider. I have reviewed the above documentation for accuracy and completeness and I agree with the above.  This note was electronically signed.    [employer]  03/03/2017 1:04 PM

## 2017-03-03 NOTE — Progress Notes (Signed)
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 female is here because of referral by Berneta Sages for DCIS Left breast. Patient has a past medical history significant for arrhythmia, left breast cancer- lumpectomy, DDD, diverticular disease, hyperlipidemia, hypertension, and osteopenia. Patient underwent left breast lumpectomy on 11/25/2013. From 06/26/2015 -- Status post removal and XRT.   Patient has not been following with oncology and needed to re-establish with oncology for follow up of breast cancer. She last saw oncology on 12/10/15 with Dr. Tressie Stalker.   Patient's most recent mammogram on 02/06/16 was negative for any malignancy.   Sarah Moyer presents today for follow up of her breast cancer and re-establishment with oncologist. She states she is feeling well currently.  She recently had right rip hip replacement due to breaking her hip on 02/02/2017. She notes she takes 1 pain pill a day for the pain and does physical therapy. She is currently using a walker to ambulate.   She was scheduled for a routine annual mammogram in April 2018 but missed it due to her hip fracture and problems with mobility.  She denies palpating any new breast masses. Denies chest pain, sob, and abdominal pain.    MEDICAL HISTORY:  Past Medical History:  Diagnosis Date  . Arrhythmia    left BBB,    PVC'S  . Cancer ([place]) 10/2013   LEFT BREAST CANCER--LUMPECTOMY  . DDD (degenerative disc disease)   . Diverticular disease    With colonic polyps  . Hiatal hernia   . Hyperlipidemia   . Hypertension     no meds    dr Radene Gunning  . Osteopenia     SURGICAL HISTORY: Past Surgical History:  Procedure Laterality Date  . APPENDECTOMY    . BREAST LUMPECTOMY WITH NEEDLE LOCALIZATION Left  11/25/2013   Procedure: LEFT BREAST WIRE GUIDED LUMPECTOMY;  Surgeon: Rolm Bookbinder, MD;  Location: [facility];  Service: General;  Laterality: Left;  . BREAST SURGERY    . CATARACT EXTRACTION    . HIP ARTHROPLASTY  06/21/2012   Procedure: ARTHROPLASTY BIPOLAR HIP;  Surgeon: Mauri Pole, MD;  Location: [facility];  Service: Orthopedics;  Laterality: Left;  . HIP ARTHROPLASTY Right 02/03/2017   Procedure: ARTHROPLASTY BIPOLAR HIP (HEMIARTHROPLASTY) RIGHT;  Surgeon: Paralee Cancel, MD;  Location: [facility];  Service: Orthopedics;  Laterality: Right;    SOCIAL HISTORY: Social History   Social History  . Marital status: Married    Spouse name: N/A  . Number of children: 2  . Years of education: N/A   Occupational History  . Not on file.   Social History Main Topics  . Smoking status: Never Smoker  . Smokeless tobacco: Never Used  . Alcohol use No  . Drug use: No  . Sexual activity: No   Other Topics Concern  . Not on file   Social History Narrative   Lives with husband.      FAMILY HISTORY: Family History  Problem Relation Age of Onset  . Heart Problems Mother     Died age 31, no details/Died age 98, no details  . CAD Brother 81  . CAD Brother 63    ALLERGIES:  has No Known Allergies.  MEDICATIONS:  Current Outpatient Prescriptions  Medication Sig Dispense Refill  . acetaminophen (  TYLENOL) 500 MG tablet Take 500 mg by mouth every 6 (six) hours as needed.    Marland Kitchen aspirin 81 MG chewable tablet Chew 1 tablet (81 mg total) by mouth 2 (two) times daily. Take for 4 weeks, then resume regular dose. 60 tablet 0  . Calcium Carbonate-Vitamin D (CALCIUM PLUS VITAMIN D PO) Take by mouth daily.    Marland Kitchen donepezil (ARICEPT) 10 MG tablet Take 10 mg by mouth every evening.    . ferrous sulfate 325 (65 FE) MG tablet Take 1 tablet (325 mg total) by mouth 3 (three) times daily after meals. For 2-3 weeks.  3  . HYDROcodone-acetaminophen (NORCO/VICODIN) 5-325 MG tablet Take one to two tablets by mouth  every 6 hours as needed for moderate to severe pain. DNE 3gms/24hrs from all sources 240 tablet 0  . methocarbamol (ROBAXIN) 500 MG tablet Take 1 tablet (500 mg total) by mouth every 6 (six) hours as needed for muscle spasms. 30 tablet 0  . polyethylene glycol (MIRALAX / GLYCOLAX) packet Take 17 g by mouth daily. 14 each 0  . senna (SENOKOT) 8.6 MG TABS tablet Take 1 tablet (8.6 mg total) by mouth daily. 120 each 0  . simvastatin (ZOCOR) 20 MG tablet Take 20 mg by mouth daily.      No current facility-administered medications for this visit.     Review of Systems  Constitutional: Negative.        Loss of appetite.  HENT: Negative.   Eyes: Negative.   Respiratory: Negative.  Negative for shortness of breath.   Cardiovascular: Negative.  Negative for chest pain.  Gastrointestinal: Negative.  Negative for abdominal pain.  Genitourinary: Negative.   Musculoskeletal: Negative.        Right hip pain associated with her hip replacement   Skin: Negative.   Neurological: Negative.   Endo/Heme/Allergies: Negative.   Psychiatric/Behavioral: Negative.   All other systems reviewed and are negative.  14 point ROS was done and is otherwise as detailed above or in HPI   PHYSICAL EXAMINATION: ECOG PERFORMANCE STATUS: 0 - Asymptomatic  Vitals:   03/03/17 1303  BP: 132/63  Pulse: (!) 59  Resp: (!) 22  Temp: 97.8 F (36.6 C)   Filed Weights   03/03/17 1303  Weight: 125 lb 3.2 oz (56.8 kg)     Physical Exam  Constitutional: She is oriented to person, place, and time and well-developed, well-nourished, and in no distress.  Patient was not able to get on exam table due to recently having right hip replacement  HENT:  Head: Normocephalic and atraumatic.  Eyes: Conjunctivae and EOM are normal. Pupils are equal, round, and reactive to light.  Neck: Normal range of motion. Neck supple.  Cardiovascular: Normal rate, regular rhythm and normal heart sounds.   Pulmonary/Chest: Effort normal  and breath sounds normal.  Abdominal: Soft. Bowel sounds are normal.  Musculoskeletal: Normal range of motion.  Neurological: She is alert and oriented to person, place, and time. Gait normal.  Skin: Skin is warm and dry.  Nursing note and vitals reviewed.     LABORATORY DATA:  I have reviewed the data as listed Lab Results  Component Value Date   WBC 9.3 02/09/2017   HGB 12.6 02/09/2017   HCT 37.7 02/09/2017   MCV 93.5 02/09/2017   PLT 260 02/09/2017   CMP     Component Value Date/Time   NA 136 02/09/2017 1030   NA 143 11/16/2013 1209   K 4.5 02/09/2017 1030   K  4.5 11/16/2013 1209   CL 99 (L) 02/09/2017 1030   CO2 31 02/09/2017 1030   CO2 28 11/16/2013 1209   GLUCOSE 98 02/09/2017 1030   GLUCOSE 94 11/16/2013 1209   BUN 18 02/09/2017 1030   BUN 14.6 11/16/2013 1209   CREATININE 0.79 02/09/2017 1030   CREATININE 0.8 11/16/2013 1209   CALCIUM 9.3 02/09/2017 1030   CALCIUM 9.7 11/16/2013 1209   PROT 6.7 11/16/2013 1209   ALBUMIN 3.8 11/16/2013 1209   AST 14 11/16/2013 1209   ALT 10 11/16/2013 1209   ALKPHOS 48 11/16/2013 1209   BILITOT 0.90 11/16/2013 1209   GFRNONAA >60 02/09/2017 1030   GFRAA >60 02/09/2017 1030     RADIOGRAPHIC STUDIES: I have personally reviewed the radiological images as listed and agreed with the findings in the report. No results found.   2D DIGITAL DIAGNOSTIC BILATERAL MAMMOGRAM WITH CAD AND ADJUNCT TOMO 02/06/2016  IMPRESSION: No evidence of malignancy.  DG HIP (WITH OR WITHOUT PELVIS) 2-3V RIGHT 02/02/2017  IMPRESSION: Right femoral neck fracture.   PORTABLE PELVIS 1-2 VIEWS 02/03/2017   IMPRESSION: Status post right hip replacement as above.    PORTABLE CHEST 1 VIEW 02/05/2017  IMPRESSION: Ill-defined opacity at the left base at the costophrenic angle, potentially atelectasis or consolidation. If there is concern for acute process, at least a formal PA and lateral chest x-ray would be recommended, or alternatively  chest CT.  Aortic atherosclerosis.  Hiatal hernia.     PATHOLOGY:    ASSESSMENT & PLAN:  Cancer Staging Breast cancer of upper-outer quadrant of left female breast ([place]) Staging form: Breast, AJCC 7th Edition - Clinical: Stage 0 (Tis (DCIS), N0, cM0) - Unsigned Staging comments: Staged at breast conference 11/16/13  - Pathologic: No stage assigned - Unsigned  No problem-specific Assessment & Plan notes found for this encounter.  Patient is doing well. Patient's most recent mammogram on 02/06/16 was negative for any malignancy.   I have rescheduled her bilateral screening mammogram for next month when her mobility will be improved, here at [facility]. I will call her if I note any abnormalities on her mammogram concerning for malignancy.  RTC in 1 year for follow up.   ORDERS PLACED FOR THIS ENCOUNTER: Orders Placed This Encounter  Procedures  . MM Digital Screening      All questions were answered. The patient knows to call the clinic with any problems, questions or concerns.  This document serves as a record of services personally performed by Twana First, MD. It was created on her behalf by Shirlean Mylar, a trained medical scribe. The creation of this record is based on the scribe's personal observations and the provider's statements to them. This document has been checked and approved by the attending provider. I have reviewed the above documentation for accuracy and completeness and I agree with the above.  This note was electronically signed.    Twana First, MD  03/03/2017 4:03 PM

## 2017-03-03 NOTE — Patient Instructions (Addendum)
[facility] at [facility] Discharge Instructions  RECOMMENDATIONS MADE BY THE CONSULTANT AND ANY TEST RESULTS WILL BE SENT TO YOUR REFERRING PHYSICIAN.  You were seen today by Dr. Twana First We will schedule you for bilateral screening mammogram in 4 weeks Follow up in 1 year   Thank you for choosing [facility] [place] at [facility] to provide your oncology and hematology care.  To afford each patient quality time with our provider, please arrive at least 15 minutes before your scheduled appointment time.    If you have a lab appointment with the [facility] please come in thru the  Main Entrance and check in at the main information desk  You need to re-schedule your appointment should you arrive 10 or more minutes late.  We strive to give you quality time with our providers, and arriving late affects you and other patients whose appointments are after yours.  Also, if you no show three or more times for appointments you may be dismissed from the clinic at the providers discretion.     Again, thank you for choosing [facility].  Our hope is that these requests will decrease the amount of time that you wait before being seen by our physicians.       _____________________________________________________________  Should you have questions after your visit to [facility], please contact our office at [phone] between the hours of 8:30 a.m. and 4:30 p.m.  Voicemails left after 4:30 p.m. will not be returned until the following business day.  For prescription refill requests, have your pharmacy contact our office.       Resources For Cancer Patients and their Caregivers ? American Cancer Society: Can assist with transportation, wigs, general needs, runs Look Good Feel Better.        307-603-8386 ? Cancer Care: Provides financial assistance, online support groups, medication/co-pay assistance.  1-800-813-HOPE  (217)344-1446) ? [facility] Assists [place] cancer patients and their families through emotional , educational and financial support.  8075595919 ? [place] DSS Where to apply for food stamps, Medicaid and utility assistance. 478-104-8415 ? [facility]: Transportation to medical appointments. (424)231-3431 ? Social Security Administration: May apply for disability if have a Stage IV cancer. 845 119 0597 (248) 255-0698 ? [employer], Disability and Transit Services: Assists with nutrition, care and transit needs. [facility] Support Programs: @10RELATIVEDAYS @ > Cancer Support Group  2nd Tuesday of the month 1pm-2pm, Journey Room  > Creative Journey  3rd Tuesday of the month 1130am-1pm, Journey Room  > Look Good Feel Better  1st Wednesday of the month 10am-12 noon, Journey Room (Call [facility] to register [phone])

## 2017-03-04 ENCOUNTER — Other Ambulatory Visit (HOSPITAL_COMMUNITY): Payer: Self-pay | Admitting: Internal Medicine

## 2017-03-04 DIAGNOSIS — Z7982 Long term (current) use of aspirin: Secondary | ICD-10-CM | POA: Diagnosis not present

## 2017-03-04 DIAGNOSIS — Z853 Personal history of malignant neoplasm of breast: Secondary | ICD-10-CM

## 2017-03-04 DIAGNOSIS — M519 Unspecified thoracic, thoracolumbar and lumbosacral intervertebral disc disorder: Secondary | ICD-10-CM | POA: Diagnosis not present

## 2017-03-04 DIAGNOSIS — Z9181 History of falling: Secondary | ICD-10-CM | POA: Diagnosis not present

## 2017-03-04 DIAGNOSIS — M80051D Age-related osteoporosis with current pathological fracture, right femur, subsequent encounter for fracture with routine healing: Secondary | ICD-10-CM | POA: Diagnosis not present

## 2017-03-04 DIAGNOSIS — Z96642 Presence of left artificial hip joint: Secondary | ICD-10-CM | POA: Diagnosis not present

## 2017-03-04 DIAGNOSIS — I447 Left bundle-branch block, unspecified: Secondary | ICD-10-CM | POA: Diagnosis not present

## 2017-03-04 DIAGNOSIS — I10 Essential (primary) hypertension: Secondary | ICD-10-CM | POA: Diagnosis not present

## 2017-03-04 DIAGNOSIS — Z96641 Presence of right artificial hip joint: Secondary | ICD-10-CM | POA: Diagnosis not present

## 2017-03-06 DIAGNOSIS — Z96642 Presence of left artificial hip joint: Secondary | ICD-10-CM | POA: Diagnosis not present

## 2017-03-06 DIAGNOSIS — M80051D Age-related osteoporosis with current pathological fracture, right femur, subsequent encounter for fracture with routine healing: Secondary | ICD-10-CM | POA: Diagnosis not present

## 2017-03-06 DIAGNOSIS — M519 Unspecified thoracic, thoracolumbar and lumbosacral intervertebral disc disorder: Secondary | ICD-10-CM | POA: Diagnosis not present

## 2017-03-06 DIAGNOSIS — I10 Essential (primary) hypertension: Secondary | ICD-10-CM | POA: Diagnosis not present

## 2017-03-06 DIAGNOSIS — Z7982 Long term (current) use of aspirin: Secondary | ICD-10-CM | POA: Diagnosis not present

## 2017-03-06 DIAGNOSIS — Z853 Personal history of malignant neoplasm of breast: Secondary | ICD-10-CM | POA: Diagnosis not present

## 2017-03-06 DIAGNOSIS — I447 Left bundle-branch block, unspecified: Secondary | ICD-10-CM | POA: Diagnosis not present

## 2017-03-06 DIAGNOSIS — Z96641 Presence of right artificial hip joint: Secondary | ICD-10-CM | POA: Diagnosis not present

## 2017-03-06 DIAGNOSIS — Z9181 History of falling: Secondary | ICD-10-CM | POA: Diagnosis not present

## 2017-03-09 DIAGNOSIS — I10 Essential (primary) hypertension: Secondary | ICD-10-CM | POA: Diagnosis not present

## 2017-03-09 DIAGNOSIS — Z96642 Presence of left artificial hip joint: Secondary | ICD-10-CM | POA: Diagnosis not present

## 2017-03-09 DIAGNOSIS — I447 Left bundle-branch block, unspecified: Secondary | ICD-10-CM | POA: Diagnosis not present

## 2017-03-09 DIAGNOSIS — Z9181 History of falling: Secondary | ICD-10-CM | POA: Diagnosis not present

## 2017-03-09 DIAGNOSIS — Z7982 Long term (current) use of aspirin: Secondary | ICD-10-CM | POA: Diagnosis not present

## 2017-03-09 DIAGNOSIS — Z853 Personal history of malignant neoplasm of breast: Secondary | ICD-10-CM | POA: Diagnosis not present

## 2017-03-09 DIAGNOSIS — M80051D Age-related osteoporosis with current pathological fracture, right femur, subsequent encounter for fracture with routine healing: Secondary | ICD-10-CM | POA: Diagnosis not present

## 2017-03-09 DIAGNOSIS — Z96641 Presence of right artificial hip joint: Secondary | ICD-10-CM | POA: Diagnosis not present

## 2017-03-09 DIAGNOSIS — M519 Unspecified thoracic, thoracolumbar and lumbosacral intervertebral disc disorder: Secondary | ICD-10-CM | POA: Diagnosis not present

## 2017-03-10 ENCOUNTER — Ambulatory Visit (HOSPITAL_COMMUNITY)
Admission: RE | Admit: 2017-03-10 | Discharge: 2017-03-10 | Disposition: A | Discharge status: Home / Self Care | Payer: Medicare HMO | Source: Ambulatory Visit | Attending: Internal Medicine | Admitting: Internal Medicine

## 2017-03-10 DIAGNOSIS — Z9889 Other specified postprocedural states: Secondary | ICD-10-CM | POA: Diagnosis not present

## 2017-03-10 DIAGNOSIS — R928 Other abnormal and inconclusive findings on diagnostic imaging of breast: Secondary | ICD-10-CM | POA: Diagnosis not present

## 2017-03-13 DIAGNOSIS — Z853 Personal history of malignant neoplasm of breast: Secondary | ICD-10-CM | POA: Diagnosis not present

## 2017-03-13 DIAGNOSIS — Z96642 Presence of left artificial hip joint: Secondary | ICD-10-CM | POA: Diagnosis not present

## 2017-03-13 DIAGNOSIS — I447 Left bundle-branch block, unspecified: Secondary | ICD-10-CM | POA: Diagnosis not present

## 2017-03-13 DIAGNOSIS — M519 Unspecified thoracic, thoracolumbar and lumbosacral intervertebral disc disorder: Secondary | ICD-10-CM | POA: Diagnosis not present

## 2017-03-13 DIAGNOSIS — Z9181 History of falling: Secondary | ICD-10-CM | POA: Diagnosis not present

## 2017-03-13 DIAGNOSIS — I10 Essential (primary) hypertension: Secondary | ICD-10-CM | POA: Diagnosis not present

## 2017-03-13 DIAGNOSIS — Z7982 Long term (current) use of aspirin: Secondary | ICD-10-CM | POA: Diagnosis not present

## 2017-03-13 DIAGNOSIS — M80051D Age-related osteoporosis with current pathological fracture, right femur, subsequent encounter for fracture with routine healing: Secondary | ICD-10-CM | POA: Diagnosis not present

## 2017-03-13 DIAGNOSIS — Z96641 Presence of right artificial hip joint: Secondary | ICD-10-CM | POA: Diagnosis not present

## 2017-03-17 ENCOUNTER — Encounter: Payer: Self-pay | Admitting: *Deleted

## 2017-03-17 DIAGNOSIS — Z9181 History of falling: Secondary | ICD-10-CM | POA: Diagnosis not present

## 2017-03-17 DIAGNOSIS — Z96641 Presence of right artificial hip joint: Secondary | ICD-10-CM | POA: Diagnosis not present

## 2017-03-17 DIAGNOSIS — I447 Left bundle-branch block, unspecified: Secondary | ICD-10-CM | POA: Diagnosis not present

## 2017-03-17 DIAGNOSIS — M80051D Age-related osteoporosis with current pathological fracture, right femur, subsequent encounter for fracture with routine healing: Secondary | ICD-10-CM | POA: Diagnosis not present

## 2017-03-17 DIAGNOSIS — M519 Unspecified thoracic, thoracolumbar and lumbosacral intervertebral disc disorder: Secondary | ICD-10-CM | POA: Diagnosis not present

## 2017-03-17 DIAGNOSIS — Z853 Personal history of malignant neoplasm of breast: Secondary | ICD-10-CM | POA: Diagnosis not present

## 2017-03-17 DIAGNOSIS — I10 Essential (primary) hypertension: Secondary | ICD-10-CM | POA: Diagnosis not present

## 2017-03-17 DIAGNOSIS — Z96642 Presence of left artificial hip joint: Secondary | ICD-10-CM | POA: Diagnosis not present

## 2017-03-17 DIAGNOSIS — Z7982 Long term (current) use of aspirin: Secondary | ICD-10-CM | POA: Diagnosis not present

## 2017-03-17 NOTE — Progress Notes (Signed)
[facility] Psychosocial Distress Screening Clinical Social Work  Clinical Social Work was referred by distress screening protocol.  The patient scored a 5 on the Psychosocial Distress Thermometer which indicates moderate distress. Clinical Social Worker reviewed chart to assess for distress and other psychosocial needs. Pt was here at [facility] for a three year follow up for past breast cancer. Had recent hip replacement, those pain concerns. This was addressed by her medical provider. No need for CSW follow up at this time.   ONCBCN DISTRESS SCREENING 03/03/2017  Screening Type Initial Screening  Distress experienced in past week (1-10) 5  Physical Problem type Pain  Physician notified of physical symptoms Yes  Referral to clinical psychology No  Referral to clinical social work No  Referral to dietition No  Referral to financial advocate No  Referral to support programs No  Referral to palliative care No    Clinical Social Worker follow up needed: No.  If yes, follow up plan:  Loren Racer, LCSW, OSW-C [place] Tuesdays   [phone]'

## 2017-03-19 DIAGNOSIS — M519 Unspecified thoracic, thoracolumbar and lumbosacral intervertebral disc disorder: Secondary | ICD-10-CM | POA: Diagnosis not present

## 2017-03-19 DIAGNOSIS — I447 Left bundle-branch block, unspecified: Secondary | ICD-10-CM | POA: Diagnosis not present

## 2017-03-19 DIAGNOSIS — Z853 Personal history of malignant neoplasm of breast: Secondary | ICD-10-CM | POA: Diagnosis not present

## 2017-03-19 DIAGNOSIS — I10 Essential (primary) hypertension: Secondary | ICD-10-CM | POA: Diagnosis not present

## 2017-03-19 DIAGNOSIS — Z7982 Long term (current) use of aspirin: Secondary | ICD-10-CM | POA: Diagnosis not present

## 2017-03-19 DIAGNOSIS — Z9181 History of falling: Secondary | ICD-10-CM | POA: Diagnosis not present

## 2017-03-19 DIAGNOSIS — Z96641 Presence of right artificial hip joint: Secondary | ICD-10-CM | POA: Diagnosis not present

## 2017-03-19 DIAGNOSIS — Z96642 Presence of left artificial hip joint: Secondary | ICD-10-CM | POA: Diagnosis not present

## 2017-03-19 DIAGNOSIS — M80051D Age-related osteoporosis with current pathological fracture, right femur, subsequent encounter for fracture with routine healing: Secondary | ICD-10-CM | POA: Diagnosis not present

## 2017-03-20 DIAGNOSIS — Z96641 Presence of right artificial hip joint: Secondary | ICD-10-CM | POA: Diagnosis not present

## 2017-03-20 DIAGNOSIS — Z471 Aftercare following joint replacement surgery: Secondary | ICD-10-CM | POA: Diagnosis not present

## 2017-03-23 DIAGNOSIS — Z7982 Long term (current) use of aspirin: Secondary | ICD-10-CM | POA: Diagnosis not present

## 2017-03-23 DIAGNOSIS — D509 Iron deficiency anemia, unspecified: Secondary | ICD-10-CM | POA: Diagnosis not present

## 2017-03-23 DIAGNOSIS — M810 Age-related osteoporosis without current pathological fracture: Secondary | ICD-10-CM | POA: Diagnosis not present

## 2017-03-23 DIAGNOSIS — M80051D Age-related osteoporosis with current pathological fracture, right femur, subsequent encounter for fracture with routine healing: Secondary | ICD-10-CM | POA: Diagnosis not present

## 2017-03-23 DIAGNOSIS — Z853 Personal history of malignant neoplasm of breast: Secondary | ICD-10-CM | POA: Diagnosis not present

## 2017-03-23 DIAGNOSIS — K5900 Constipation, unspecified: Secondary | ICD-10-CM | POA: Diagnosis not present

## 2017-03-23 DIAGNOSIS — I447 Left bundle-branch block, unspecified: Secondary | ICD-10-CM | POA: Diagnosis not present

## 2017-03-23 DIAGNOSIS — R413 Other amnesia: Secondary | ICD-10-CM | POA: Diagnosis not present

## 2017-03-23 DIAGNOSIS — Z96641 Presence of right artificial hip joint: Secondary | ICD-10-CM | POA: Diagnosis not present

## 2017-03-23 DIAGNOSIS — J302 Other seasonal allergic rhinitis: Secondary | ICD-10-CM | POA: Diagnosis not present

## 2017-03-23 DIAGNOSIS — Z8781 Personal history of (healed) traumatic fracture: Secondary | ICD-10-CM | POA: Diagnosis not present

## 2017-03-23 DIAGNOSIS — Z96642 Presence of left artificial hip joint: Secondary | ICD-10-CM | POA: Diagnosis not present

## 2017-03-23 DIAGNOSIS — Z9181 History of falling: Secondary | ICD-10-CM | POA: Diagnosis not present

## 2017-03-23 DIAGNOSIS — I10 Essential (primary) hypertension: Secondary | ICD-10-CM | POA: Diagnosis not present

## 2017-03-23 DIAGNOSIS — M519 Unspecified thoracic, thoracolumbar and lumbosacral intervertebral disc disorder: Secondary | ICD-10-CM | POA: Diagnosis not present

## 2017-03-25 DIAGNOSIS — I10 Essential (primary) hypertension: Secondary | ICD-10-CM | POA: Diagnosis not present

## 2017-03-25 DIAGNOSIS — Z9181 History of falling: Secondary | ICD-10-CM | POA: Diagnosis not present

## 2017-03-25 DIAGNOSIS — Z7982 Long term (current) use of aspirin: Secondary | ICD-10-CM | POA: Diagnosis not present

## 2017-03-25 DIAGNOSIS — I447 Left bundle-branch block, unspecified: Secondary | ICD-10-CM | POA: Diagnosis not present

## 2017-03-25 DIAGNOSIS — Z96642 Presence of left artificial hip joint: Secondary | ICD-10-CM | POA: Diagnosis not present

## 2017-03-25 DIAGNOSIS — Z853 Personal history of malignant neoplasm of breast: Secondary | ICD-10-CM | POA: Diagnosis not present

## 2017-03-25 DIAGNOSIS — Z96641 Presence of right artificial hip joint: Secondary | ICD-10-CM | POA: Diagnosis not present

## 2017-03-25 DIAGNOSIS — M80051D Age-related osteoporosis with current pathological fracture, right femur, subsequent encounter for fracture with routine healing: Secondary | ICD-10-CM | POA: Diagnosis not present

## 2017-03-25 DIAGNOSIS — M519 Unspecified thoracic, thoracolumbar and lumbosacral intervertebral disc disorder: Secondary | ICD-10-CM | POA: Diagnosis not present

## 2017-03-26 ENCOUNTER — Ambulatory Visit (INDEPENDENT_AMBULATORY_CARE_PROVIDER_SITE_OTHER): Payer: Medicare HMO | Admitting: Otolaryngology

## 2017-03-26 DIAGNOSIS — H6983 Other specified disorders of Eustachian tube, bilateral: Secondary | ICD-10-CM

## 2017-03-26 DIAGNOSIS — H903 Sensorineural hearing loss, bilateral: Secondary | ICD-10-CM | POA: Diagnosis not present

## 2017-03-26 DIAGNOSIS — H6123 Impacted cerumen, bilateral: Secondary | ICD-10-CM | POA: Diagnosis not present

## 2017-03-30 DIAGNOSIS — Z96642 Presence of left artificial hip joint: Secondary | ICD-10-CM | POA: Diagnosis not present

## 2017-03-30 DIAGNOSIS — Z853 Personal history of malignant neoplasm of breast: Secondary | ICD-10-CM | POA: Diagnosis not present

## 2017-03-30 DIAGNOSIS — Z9181 History of falling: Secondary | ICD-10-CM | POA: Diagnosis not present

## 2017-03-30 DIAGNOSIS — Z96641 Presence of right artificial hip joint: Secondary | ICD-10-CM | POA: Diagnosis not present

## 2017-03-30 DIAGNOSIS — Z7982 Long term (current) use of aspirin: Secondary | ICD-10-CM | POA: Diagnosis not present

## 2017-03-30 DIAGNOSIS — I10 Essential (primary) hypertension: Secondary | ICD-10-CM | POA: Diagnosis not present

## 2017-03-30 DIAGNOSIS — M80051D Age-related osteoporosis with current pathological fracture, right femur, subsequent encounter for fracture with routine healing: Secondary | ICD-10-CM | POA: Diagnosis not present

## 2017-03-30 DIAGNOSIS — I447 Left bundle-branch block, unspecified: Secondary | ICD-10-CM | POA: Diagnosis not present

## 2017-03-30 DIAGNOSIS — M519 Unspecified thoracic, thoracolumbar and lumbosacral intervertebral disc disorder: Secondary | ICD-10-CM | POA: Diagnosis not present

## 2017-04-02 DIAGNOSIS — I447 Left bundle-branch block, unspecified: Secondary | ICD-10-CM | POA: Diagnosis not present

## 2017-04-02 DIAGNOSIS — Z7982 Long term (current) use of aspirin: Secondary | ICD-10-CM | POA: Diagnosis not present

## 2017-04-02 DIAGNOSIS — M80051D Age-related osteoporosis with current pathological fracture, right femur, subsequent encounter for fracture with routine healing: Secondary | ICD-10-CM | POA: Diagnosis not present

## 2017-04-02 DIAGNOSIS — Z9181 History of falling: Secondary | ICD-10-CM | POA: Diagnosis not present

## 2017-04-02 DIAGNOSIS — Z96641 Presence of right artificial hip joint: Secondary | ICD-10-CM | POA: Diagnosis not present

## 2017-04-02 DIAGNOSIS — Z853 Personal history of malignant neoplasm of breast: Secondary | ICD-10-CM | POA: Diagnosis not present

## 2017-04-02 DIAGNOSIS — I10 Essential (primary) hypertension: Secondary | ICD-10-CM | POA: Diagnosis not present

## 2017-04-02 DIAGNOSIS — Z96642 Presence of left artificial hip joint: Secondary | ICD-10-CM | POA: Diagnosis not present

## 2017-04-02 DIAGNOSIS — M519 Unspecified thoracic, thoracolumbar and lumbosacral intervertebral disc disorder: Secondary | ICD-10-CM | POA: Diagnosis not present

## 2017-09-23 DIAGNOSIS — Z471 Aftercare following joint replacement surgery: Secondary | ICD-10-CM | POA: Diagnosis not present

## 2017-09-23 DIAGNOSIS — Z96641 Presence of right artificial hip joint: Secondary | ICD-10-CM | POA: Diagnosis not present

## 2017-09-23 DIAGNOSIS — M7061 Trochanteric bursitis, right hip: Secondary | ICD-10-CM | POA: Diagnosis not present

## 2017-09-23 DIAGNOSIS — Z96649 Presence of unspecified artificial hip joint: Secondary | ICD-10-CM | POA: Diagnosis not present

## 2017-11-26 DIAGNOSIS — Z96641 Presence of right artificial hip joint: Secondary | ICD-10-CM | POA: Diagnosis not present

## 2017-11-26 DIAGNOSIS — M25551 Pain in right hip: Secondary | ICD-10-CM | POA: Diagnosis not present

## 2017-12-24 ENCOUNTER — Ambulatory Visit (INDEPENDENT_AMBULATORY_CARE_PROVIDER_SITE_OTHER): Payer: Medicare HMO | Admitting: Otolaryngology

## 2017-12-24 DIAGNOSIS — H903 Sensorineural hearing loss, bilateral: Secondary | ICD-10-CM

## 2017-12-24 DIAGNOSIS — H6123 Impacted cerumen, bilateral: Secondary | ICD-10-CM | POA: Diagnosis not present

## 2018-01-06 DIAGNOSIS — Z96641 Presence of right artificial hip joint: Secondary | ICD-10-CM | POA: Diagnosis not present

## 2018-01-06 DIAGNOSIS — M7061 Trochanteric bursitis, right hip: Secondary | ICD-10-CM | POA: Diagnosis not present

## 2018-01-27 DIAGNOSIS — R82998 Other abnormal findings in urine: Secondary | ICD-10-CM | POA: Diagnosis not present

## 2018-01-27 DIAGNOSIS — M810 Age-related osteoporosis without current pathological fracture: Secondary | ICD-10-CM | POA: Diagnosis not present

## 2018-01-27 DIAGNOSIS — E7849 Other hyperlipidemia: Secondary | ICD-10-CM | POA: Diagnosis not present

## 2018-01-27 DIAGNOSIS — I10 Essential (primary) hypertension: Secondary | ICD-10-CM | POA: Diagnosis not present

## 2018-01-28 ENCOUNTER — Other Ambulatory Visit (HOSPITAL_COMMUNITY): Payer: Self-pay | Admitting: Internal Medicine

## 2018-01-28 DIAGNOSIS — Z1231 Encounter for screening mammogram for malignant neoplasm of breast: Secondary | ICD-10-CM

## 2018-02-03 DIAGNOSIS — E7849 Other hyperlipidemia: Secondary | ICD-10-CM | POA: Diagnosis not present

## 2018-02-03 DIAGNOSIS — M810 Age-related osteoporosis without current pathological fracture: Secondary | ICD-10-CM | POA: Diagnosis not present

## 2018-02-03 DIAGNOSIS — Z0000 Encounter for general adult medical examination without abnormal findings: Secondary | ICD-10-CM | POA: Diagnosis not present

## 2018-02-03 DIAGNOSIS — D0512 Intraductal carcinoma in situ of left breast: Secondary | ICD-10-CM | POA: Diagnosis not present

## 2018-02-03 DIAGNOSIS — K449 Diaphragmatic hernia without obstruction or gangrene: Secondary | ICD-10-CM | POA: Diagnosis not present

## 2018-02-03 DIAGNOSIS — R413 Other amnesia: Secondary | ICD-10-CM | POA: Diagnosis not present

## 2018-02-03 DIAGNOSIS — K5909 Other constipation: Secondary | ICD-10-CM | POA: Diagnosis not present

## 2018-02-03 DIAGNOSIS — N390 Urinary tract infection, site not specified: Secondary | ICD-10-CM | POA: Diagnosis not present

## 2018-02-03 DIAGNOSIS — I10 Essential (primary) hypertension: Secondary | ICD-10-CM | POA: Diagnosis not present

## 2018-02-08 DIAGNOSIS — Z1212 Encounter for screening for malignant neoplasm of rectum: Secondary | ICD-10-CM | POA: Diagnosis not present

## 2018-03-02 ENCOUNTER — Encounter (HOSPITAL_COMMUNITY): Payer: Medicare HMO | Admitting: Internal Medicine

## 2018-03-04 ENCOUNTER — Other Ambulatory Visit (HOSPITAL_COMMUNITY): Payer: Self-pay | Admitting: Internal Medicine

## 2018-03-04 DIAGNOSIS — Z1231 Encounter for screening mammogram for malignant neoplasm of breast: Secondary | ICD-10-CM

## 2018-03-09 ENCOUNTER — Other Ambulatory Visit (HOSPITAL_COMMUNITY): Payer: Self-pay | Admitting: Internal Medicine

## 2018-03-09 DIAGNOSIS — D0512 Intraductal carcinoma in situ of left breast: Secondary | ICD-10-CM

## 2018-03-15 ENCOUNTER — Ambulatory Visit (HOSPITAL_COMMUNITY): Payer: Medicare HMO

## 2018-03-16 ENCOUNTER — Ambulatory Visit (HOSPITAL_COMMUNITY)
Admission: RE | Admit: 2018-03-16 | Discharge: 2018-03-16 | Disposition: A | Discharge status: Home / Self Care | Payer: Medicare HMO | Source: Ambulatory Visit | Attending: Internal Medicine | Admitting: Internal Medicine

## 2018-03-16 ENCOUNTER — Ambulatory Visit (HOSPITAL_COMMUNITY): Payer: Medicare HMO | Admitting: Internal Medicine

## 2018-03-16 ENCOUNTER — Encounter (HOSPITAL_COMMUNITY): Payer: Self-pay

## 2018-03-16 DIAGNOSIS — D0512 Intraductal carcinoma in situ of left breast: Secondary | ICD-10-CM | POA: Insufficient documentation

## 2018-03-16 DIAGNOSIS — R928 Other abnormal and inconclusive findings on diagnostic imaging of breast: Secondary | ICD-10-CM | POA: Diagnosis not present

## 2018-03-18 ENCOUNTER — Other Ambulatory Visit: Payer: Self-pay

## 2018-03-18 ENCOUNTER — Encounter (HOSPITAL_COMMUNITY): Payer: Self-pay | Admitting: Internal Medicine

## 2018-03-18 ENCOUNTER — Inpatient Hospital Stay (HOSPITAL_COMMUNITY): Payer: Medicare HMO | Attending: Internal Medicine | Admitting: Internal Medicine

## 2018-03-18 VITALS — BP 164/59 | HR 56 | Temp 97.8°F | Resp 16 | Wt 121.0 lb

## 2018-03-18 DIAGNOSIS — Z923 Personal history of irradiation: Secondary | ICD-10-CM | POA: Insufficient documentation

## 2018-03-18 DIAGNOSIS — D0512 Intraductal carcinoma in situ of left breast: Secondary | ICD-10-CM

## 2018-03-18 DIAGNOSIS — Z853 Personal history of malignant neoplasm of breast: Secondary | ICD-10-CM | POA: Diagnosis not present

## 2018-03-18 DIAGNOSIS — Z9012 Acquired absence of left breast and nipple: Secondary | ICD-10-CM | POA: Diagnosis not present

## 2018-03-18 NOTE — Progress Notes (Signed)
Diagnosis Ductal carcinoma in situ (DCIS) of left breast - Plan: MM DIAG BREAST TOMO BILATERAL  Staging Cancer Staging Breast cancer of upper-outer quadrant of left female breast ([place]) Staging form: Breast, AJCC 7th Edition - Clinical: Stage 0 (Tis (DCIS), N0, cM0) - Unsigned Staging comments: Staged at breast conference 11/16/13  - Pathologic: No stage assigned - Unsigned   Assessment and Plan:  1.  Stage 0 left breast DCIS.  Bilateral diagnostic mammogram done 03/16/2018 showed:    IMPRESSION: 1. No mammographic evidence of malignancy in either breast. 2. Stable left breast posttreatment changes.  RECOMMENDATION: Diagnostic mammogram is suggested in 1 year. (Code:[id number])  She will be set up for mammogram in 02/2019 and will follow-up at that time to go over results.  Will check labs on RTC.    2.  Osteopenia.  Pt reports she is taking calcium and vitamin D.    3.  HTN.  BP is 164/59.  Follow-up with PCP.    Interval History:  84 y.o. female followed by Dr. Talbert Cage after referral by Dr. Berneta Sages for DCIS Left breast. Patient has a past medical history significant for arrhythmia, left breast cancer- lumpectomy, DDD, diverticular disease, hyperlipidemia, hypertension, and osteopenia. Patient underwent left breast lumpectomy on 11/25/2013. From 06/26/2015 -- Status post removal and XRT.   When she was seen by Dr. Talbert Cage in 02/2017 she had not been following with oncology and needed to re-establish with oncology for follow up of breast cancer.  Previously, she was last seen by he Dr. Tressie Stalker on 12/10/15.    Current Status:  Pt is seen today for follow-up.  She is here to go over mammogram.  She denies any breast complaints.    PATHOLOGY:   Problem List Patient Active Problem List   Diagnosis Date Noted  . Closed right hip fracture ([place]) [S72.001A] 02/02/2017  . Dementia [F03.90] 02/02/2017  . Left upper quadrant pain [R10.12] 12/10/2015  . History of left hip replacement [Z96.642]  06/12/2014  . Osteoporosis [M81.0] 06/12/2014  . Dizziness [R42] 02/15/2014  . Ductal carcinoma in situ (DCIS) of left breast [D05.12] 12/23/2013  . Breast cancer of upper-outer quadrant of left female breast ([place]) [C50.412] 11/09/2013  . Hyperlipidemia [E78.5] 06/20/2012  . Leukocytosis [D72.829] 06/20/2012  . Fracture of femoral neck, left, closed ([place]) [S72.002A] 06/20/2012  . Essential hypertension, benign [I10] 09/12/2010  . DYSPNEA [R06.02] 09/12/2010  . ELECTROCARDIOGRAM, ABNORMAL [R94.31] 09/12/2010    Past Medical History Past Medical History:  Diagnosis Date  . Arrhythmia    left BBB,    PVC'S  . Cancer ([place]) 10/2013   LEFT BREAST CANCER--LUMPECTOMY  . DDD (degenerative disc disease)   . Diverticular disease    With colonic polyps  . Hiatal hernia   . Hyperlipidemia   . Hypertension     no meds    dr Radene Gunning  . Osteopenia     Past Surgical History Past Surgical History:  Procedure Laterality Date  . APPENDECTOMY    . BREAST LUMPECTOMY WITH NEEDLE LOCALIZATION Left 11/25/2013   Procedure: LEFT BREAST WIRE GUIDED LUMPECTOMY;  Surgeon: Rolm Bookbinder, MD;  Location: [facility];  Service: General;  Laterality: Left;  . BREAST SURGERY    . CATARACT EXTRACTION    . HIP ARTHROPLASTY  06/21/2012   Procedure: ARTHROPLASTY BIPOLAR HIP;  Surgeon: Mauri Pole, MD;  [facility];  Service: Orthopedics;  Laterality: Left;  . HIP ARTHROPLASTY Right 02/03/2017   Procedure: ARTHROPLASTY BIPOLAR HIP (HEMIARTHROPLASTY) RIGHT;  Surgeon:  Paralee Cancel, MD;  [facility];  Service: Orthopedics;  Laterality: Right;    Family History Family History  Problem Relation Age of Onset  . Heart Problems Mother        Died age 30, no details/Died age 59, no details  . CAD Brother 53  . CAD Brother 20     Social History  reports that she has never smoked. She has never used smokeless tobacco. She reports that she does not drink alcohol or use drugs.  Medications  Current Outpatient  Medications:  .  acetaminophen (TYLENOL) 500 MG tablet, Take 500 mg by mouth every 6 (six) hours as needed., Disp: , Rfl:  .  aspirin 81 MG chewable tablet, Chew 1 tablet (81 mg total) by mouth 2 (two) times daily. Take for 4 weeks, then resume regular dose., Disp: 60 tablet, Rfl: 0 .  Calcium Carbonate-Vitamin D (CALCIUM PLUS VITAMIN D PO), Take by mouth daily., Disp: , Rfl:  .  donepezil (ARICEPT) 10 MG tablet, Take 10 mg by mouth every evening., Disp: , Rfl:  .  polyethylene glycol (MIRALAX / GLYCOLAX) packet, Take 17 g by mouth daily., Disp: 14 each, Rfl: 0 .  senna (SENOKOT) 8.6 MG TABS tablet, Take 1 tablet (8.6 mg total) by mouth daily., Disp: 120 each, Rfl: 0 .  simvastatin (ZOCOR) 20 MG tablet, Take 20 mg by mouth daily. , Disp: , Rfl:   Allergies Patient has no known allergies.  Review of Systems Review of Systems - Oncology ROS as per HPI otherwise 12 point ROS is negative.   Physical Exam  Vitals Wt Readings from Last 3 Encounters:  03/18/18 121 lb (54.9 kg)  03/03/17 125 lb 3.2 oz (56.8 kg)  02/02/17 130 lb (59 kg)   Temp Readings from Last 3 Encounters:  03/18/18 97.8 F (36.6 C) (Oral)  03/03/17 97.8 F (36.6 C) (Oral)  02/09/17 97.5 F (36.4 C) (Oral)   BP Readings from Last 3 Encounters:  03/18/18 (!) 164/59  03/03/17 132/63  02/09/17 109/62   Pulse Readings from Last 3 Encounters:  03/18/18 (!) 56  03/03/17 (!) 59  02/09/17 60   Constitutional: Well-developed, well-nourished, and in no distress.   HENT: Head: Normocephalic and atraumatic.  Mouth/Throat: No oropharyngeal exudate. Mucosa moist. Eyes: Pupils are equal, round, and reactive to light. Conjunctivae are normal. No scleral icterus.  Neck: Normal range of motion. Neck supple. No JVD present.  Cardiovascular: Normal rate, regular rhythm and normal heart sounds.  Exam reveals no gallop and no friction rub.   No murmur heard. Pulmonary/Chest: Effort normal and breath sounds normal. No  respiratory distress. No wheezes.No rales.  Abdominal: Soft. Bowel sounds are normal. No distension. There is no tenderness. There is no guarding.  Musculoskeletal: No edema or tenderness.  Lymphadenopathy: No cervical, axillary or supraclavicular adenopathy.  Neurological: Alert and oriented to person, place, and time. No cranial nerve deficit.  Skin: Skin is warm and dry. No rash noted. No erythema. No pallor.  Psychiatric: Affect and judgment normal.  Bilateral breast exam:  Chaperone present.  No dominant masses palpable bilaterally.    Labs No visits with results within 3 Day(s) from this visit.  Latest known visit with results is:  Hospital Outpatient Visit on 02/09/2017  Component Date Value Ref Range Status  . Sodium 02/09/2017 136  135 - 145 mmol/L Final  . Potassium 02/09/2017 4.5  3.5 - 5.1 mmol/L Final  . Chloride 02/09/2017 99* 101 - 111 mmol/L Final  .  CO2 02/09/2017 31  22 - 32 mmol/L Final  . Glucose, Bld 02/09/2017 98  65 - 99 mg/dL Final  . BUN 02/09/2017 18  6 - 20 mg/dL Final  . Creatinine, Ser 02/09/2017 0.79  0.44 - 1.00 mg/dL Final  . Calcium 02/09/2017 9.3  8.9 - 10.3 mg/dL Final  . GFR calc non Af Amer 02/09/2017 >60  >60 mL/min Final  . GFR calc Af Amer 02/09/2017 >60  >60 mL/min Final   Comment: (NOTE) The eGFR has been calculated using the CKD EPI equation. This calculation has not been validated in all clinical situations. eGFR's persistently <60 mL/min signify possible Chronic Kidney Disease.   . Anion gap 02/09/2017 6  5 - 15 Final  . WBC 02/09/2017 9.3  4.0 - 10.5 K/uL Final  . RBC 02/09/2017 4.03  3.87 - 5.11 MIL/uL Final  . Hemoglobin 02/09/2017 12.6  12.0 - 15.0 g/dL Final  . HCT 02/09/2017 37.7  36.0 - 46.0 % Final  . MCV 02/09/2017 93.5  78.0 - 100.0 fL Final  . MCH 02/09/2017 31.3  26.0 - 34.0 pg Final  . MCHC 02/09/2017 33.4  30.0 - 36.0 g/dL Final  . RDW 02/09/2017 12.8  11.5 - 15.5 % Final  . Platelets 02/09/2017 260  150 - 400 K/uL  Final     Pathology Orders Placed This Encounter  Procedures  . MM DIAG BREAST TOMO BILATERAL    Standing Status:   Future    Standing Expiration Date:   03/19/2019    Order Specific Question:   Reason for Exam (SYMPTOM  OR DIAGNOSIS REQUIRED)    Answer:   left breast DCIS    Order Specific Question:   Preferred imaging location?    Answer:   [facility]       Zoila Shutter MD

## 2018-05-04 DIAGNOSIS — Z8781 Personal history of (healed) traumatic fracture: Secondary | ICD-10-CM | POA: Diagnosis not present

## 2018-05-04 DIAGNOSIS — K449 Diaphragmatic hernia without obstruction or gangrene: Secondary | ICD-10-CM | POA: Diagnosis not present

## 2018-05-04 DIAGNOSIS — I10 Essential (primary) hypertension: Secondary | ICD-10-CM | POA: Diagnosis not present

## 2018-05-04 DIAGNOSIS — R413 Other amnesia: Secondary | ICD-10-CM | POA: Diagnosis not present

## 2018-05-04 DIAGNOSIS — G479 Sleep disorder, unspecified: Secondary | ICD-10-CM | POA: Diagnosis not present

## 2018-05-04 DIAGNOSIS — Z6822 Body mass index (BMI) 22.0-22.9, adult: Secondary | ICD-10-CM | POA: Diagnosis not present

## 2018-06-24 ENCOUNTER — Ambulatory Visit (INDEPENDENT_AMBULATORY_CARE_PROVIDER_SITE_OTHER): Payer: Medicare HMO | Admitting: Otolaryngology

## 2018-06-24 DIAGNOSIS — H6123 Impacted cerumen, bilateral: Secondary | ICD-10-CM | POA: Diagnosis not present

## 2018-08-18 IMAGING — DX DG PORTABLE PELVIS
1 series · 1 of 1 positions shown · non-contrast
Comparison: June 21, 2012

CLINICAL DATA: Status post right hip replacement.

EXAM:
PORTABLE PELVIS 1-2 VIEWS

[pelvis ap]
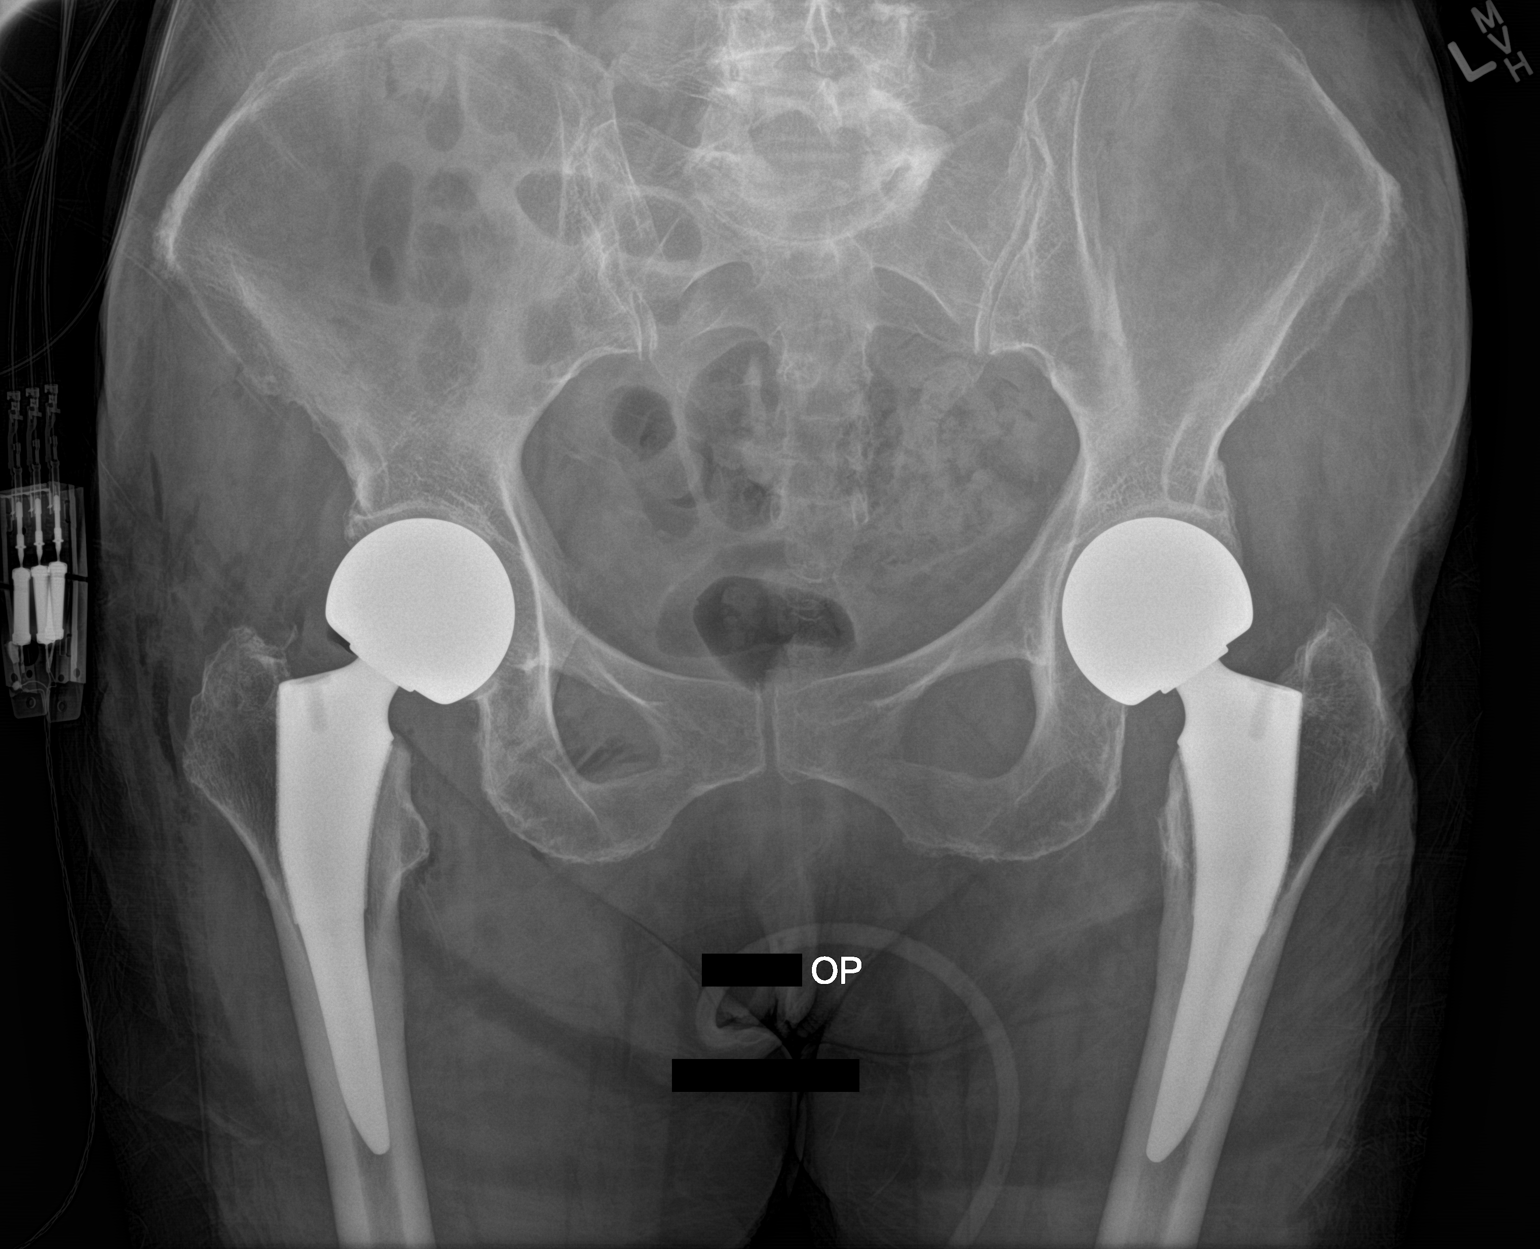

[1 of 1 positions shown; findings below may reference images not displayed]

FINDINGS: The patient is status post right hip replacement in the interval.
Acetabular and femoral components are in good position. No other
abnormalities.
IMPRESSION: Status post right hip replacement as above.

## 2018-08-20 IMAGING — DX DG CHEST 1V PORT
1 series · 1 of 1 positions shown · non-contrast
Comparison: 02/02/2017

CLINICAL DATA: 86-year-old female with a history of hypoxia status
post surgery

EXAM:
PORTABLE CHEST 1 VIEW

[chest ap]
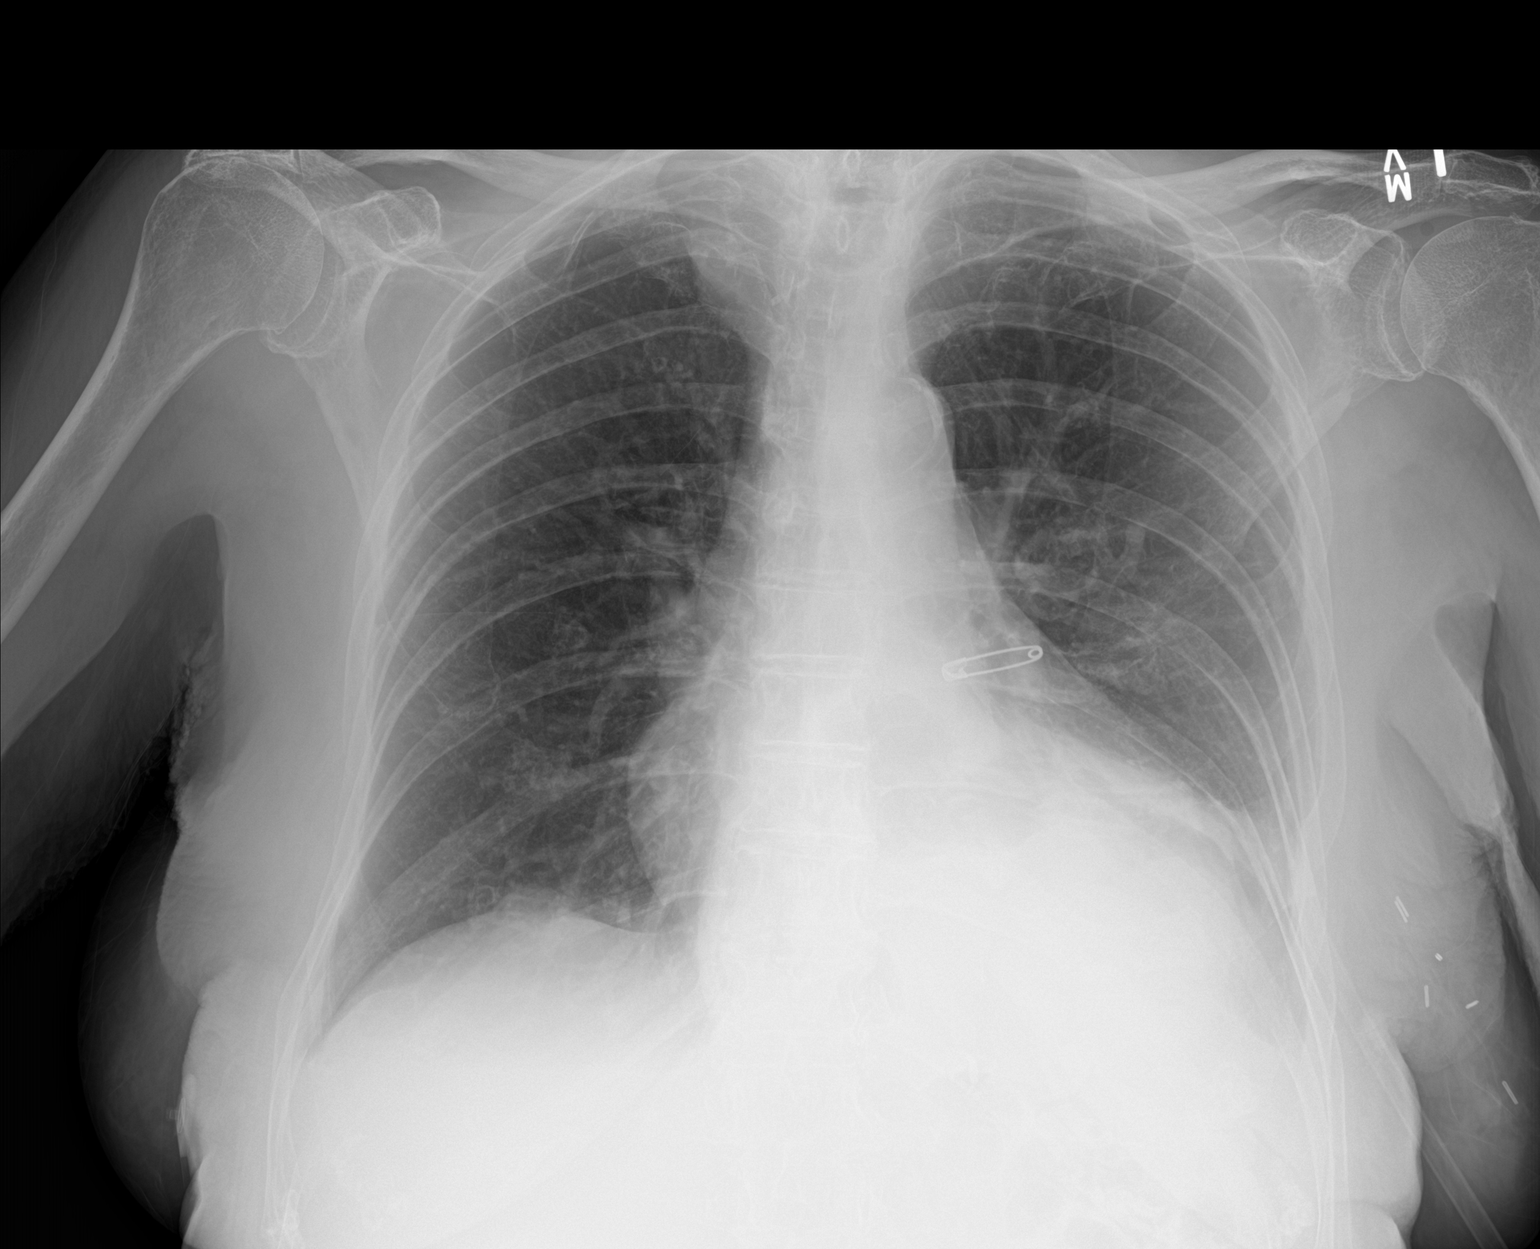

[1 of 1 positions shown; findings below may reference images not displayed]

FINDINGS: Cardiomediastinal silhouette unchanged. Double density at the
inferior mediastinum.

Calcifications of the aortic arch.

Opacity at the left costophrenic angle.

No pneumothorax.  Right lung relatively clear.
IMPRESSION: Ill-defined opacity at the left base at the costophrenic angle,
potentially atelectasis or consolidation. If there is concern for
acute process, at least a formal PA and lateral chest x-ray would be
recommended, or alternatively chest CT.

Aortic atherosclerosis.

Hiatal hernia.

## 2018-09-10 DIAGNOSIS — I10 Essential (primary) hypertension: Secondary | ICD-10-CM | POA: Diagnosis not present

## 2018-09-10 DIAGNOSIS — R413 Other amnesia: Secondary | ICD-10-CM | POA: Diagnosis not present

## 2018-09-10 DIAGNOSIS — G479 Sleep disorder, unspecified: Secondary | ICD-10-CM | POA: Diagnosis not present

## 2018-09-10 DIAGNOSIS — R42 Dizziness and giddiness: Secondary | ICD-10-CM | POA: Diagnosis not present

## 2018-09-10 DIAGNOSIS — Z6822 Body mass index (BMI) 22.0-22.9, adult: Secondary | ICD-10-CM | POA: Diagnosis not present

## 2018-09-10 DIAGNOSIS — D0512 Intraductal carcinoma in situ of left breast: Secondary | ICD-10-CM | POA: Diagnosis not present

## 2018-09-10 DIAGNOSIS — M810 Age-related osteoporosis without current pathological fracture: Secondary | ICD-10-CM | POA: Diagnosis not present

## 2018-09-10 DIAGNOSIS — E7849 Other hyperlipidemia: Secondary | ICD-10-CM | POA: Diagnosis not present

## 2018-12-16 ENCOUNTER — Ambulatory Visit (INDEPENDENT_AMBULATORY_CARE_PROVIDER_SITE_OTHER): Payer: Medicare HMO | Admitting: Otolaryngology

## 2018-12-16 DIAGNOSIS — H6123 Impacted cerumen, bilateral: Secondary | ICD-10-CM | POA: Diagnosis not present

## 2019-02-03 ENCOUNTER — Other Ambulatory Visit (HOSPITAL_COMMUNITY): Payer: Self-pay | Admitting: Internal Medicine

## 2019-02-03 DIAGNOSIS — D0512 Intraductal carcinoma in situ of left breast: Secondary | ICD-10-CM

## 2019-02-08 DIAGNOSIS — E7849 Other hyperlipidemia: Secondary | ICD-10-CM | POA: Diagnosis not present

## 2019-02-08 DIAGNOSIS — I10 Essential (primary) hypertension: Secondary | ICD-10-CM | POA: Diagnosis not present

## 2019-02-08 DIAGNOSIS — M810 Age-related osteoporosis without current pathological fracture: Secondary | ICD-10-CM | POA: Diagnosis not present

## 2019-02-14 DIAGNOSIS — I10 Essential (primary) hypertension: Secondary | ICD-10-CM | POA: Diagnosis not present

## 2019-02-14 DIAGNOSIS — R82998 Other abnormal findings in urine: Secondary | ICD-10-CM | POA: Diagnosis not present

## 2019-02-15 DIAGNOSIS — G479 Sleep disorder, unspecified: Secondary | ICD-10-CM | POA: Diagnosis not present

## 2019-02-15 DIAGNOSIS — Z0000 Encounter for general adult medical examination without abnormal findings: Secondary | ICD-10-CM | POA: Diagnosis not present

## 2019-02-15 DIAGNOSIS — R413 Other amnesia: Secondary | ICD-10-CM | POA: Diagnosis not present

## 2019-02-15 DIAGNOSIS — D0512 Intraductal carcinoma in situ of left breast: Secondary | ICD-10-CM | POA: Diagnosis not present

## 2019-02-15 DIAGNOSIS — M810 Age-related osteoporosis without current pathological fracture: Secondary | ICD-10-CM | POA: Diagnosis not present

## 2019-02-15 DIAGNOSIS — K449 Diaphragmatic hernia without obstruction or gangrene: Secondary | ICD-10-CM | POA: Diagnosis not present

## 2019-02-15 DIAGNOSIS — E785 Hyperlipidemia, unspecified: Secondary | ICD-10-CM | POA: Diagnosis not present

## 2019-02-15 DIAGNOSIS — R42 Dizziness and giddiness: Secondary | ICD-10-CM | POA: Diagnosis not present

## 2019-02-15 DIAGNOSIS — I10 Essential (primary) hypertension: Secondary | ICD-10-CM | POA: Diagnosis not present

## 2019-03-15 ENCOUNTER — Encounter (HOSPITAL_COMMUNITY): Payer: Medicare HMO

## 2019-03-22 ENCOUNTER — Ambulatory Visit (HOSPITAL_COMMUNITY): Payer: Medicare HMO | Admitting: Hematology

## 2019-03-22 ENCOUNTER — Other Ambulatory Visit (HOSPITAL_COMMUNITY): Payer: Self-pay | Admitting: Hematology

## 2019-03-22 DIAGNOSIS — R928 Other abnormal and inconclusive findings on diagnostic imaging of breast: Secondary | ICD-10-CM

## 2019-03-29 ENCOUNTER — Ambulatory Visit (HOSPITAL_COMMUNITY): Payer: Medicare HMO

## 2019-03-29 ENCOUNTER — Ambulatory Visit (HOSPITAL_COMMUNITY)
Admission: RE | Admit: 2019-03-29 | Discharge: 2019-03-29 | Disposition: A | Discharge status: Home / Self Care | Payer: Medicare HMO | Source: Ambulatory Visit | Attending: Internal Medicine | Admitting: Internal Medicine

## 2019-03-29 ENCOUNTER — Other Ambulatory Visit: Payer: Self-pay

## 2019-03-29 DIAGNOSIS — D0512 Intraductal carcinoma in situ of left breast: Secondary | ICD-10-CM | POA: Diagnosis not present

## 2019-03-29 DIAGNOSIS — Z853 Personal history of malignant neoplasm of breast: Secondary | ICD-10-CM | POA: Diagnosis not present

## 2019-03-29 DIAGNOSIS — R928 Other abnormal and inconclusive findings on diagnostic imaging of breast: Secondary | ICD-10-CM | POA: Diagnosis not present

## 2019-04-04 ENCOUNTER — Other Ambulatory Visit: Payer: Self-pay

## 2019-04-05 ENCOUNTER — Ambulatory Visit (HOSPITAL_COMMUNITY): Payer: Medicare HMO | Admitting: Hematology

## 2019-04-05 ENCOUNTER — Inpatient Hospital Stay (HOSPITAL_COMMUNITY): Payer: Medicare HMO | Attending: Hematology | Admitting: Hematology

## 2019-04-05 ENCOUNTER — Encounter (HOSPITAL_COMMUNITY): Payer: Self-pay | Admitting: Hematology

## 2019-04-05 VITALS — BP 164/57 | HR 69 | Temp 98.0°F | Resp 16 | Wt 126.0 lb

## 2019-04-05 DIAGNOSIS — Z170 Estrogen receptor positive status [ER+]: Secondary | ICD-10-CM | POA: Diagnosis not present

## 2019-04-05 DIAGNOSIS — M8580 Other specified disorders of bone density and structure, unspecified site: Secondary | ICD-10-CM | POA: Insufficient documentation

## 2019-04-05 DIAGNOSIS — I10 Essential (primary) hypertension: Secondary | ICD-10-CM | POA: Insufficient documentation

## 2019-04-05 DIAGNOSIS — C50412 Malignant neoplasm of upper-outer quadrant of left female breast: Secondary | ICD-10-CM

## 2019-04-05 DIAGNOSIS — Z7982 Long term (current) use of aspirin: Secondary | ICD-10-CM | POA: Diagnosis not present

## 2019-04-05 DIAGNOSIS — Z79899 Other long term (current) drug therapy: Secondary | ICD-10-CM | POA: Diagnosis not present

## 2019-04-05 DIAGNOSIS — D0512 Intraductal carcinoma in situ of left breast: Secondary | ICD-10-CM

## 2019-04-05 NOTE — Assessment & Plan Note (Signed)
1.  Left breast DCIS: -She had a lumpectomy on 11/25/2013.  This showed 1.8 cm high-grade DCIS with negative margins.  ER was positive and PR negative. - She denies feeling any lumps in her breasts. -Today's physical examination did not reveal any palpable masses.  Lumpectomy site is within normal range.  No axillary adenopathy. -We discussed the results of the mammogram dated 03/29/2019 which was BI-RADS Category 2. - We will see her back in 1 year for follow-up with repeat mammogram.  2.  Osteopenia: -DEXA scan in 2013 reportedly showed osteopenia. -She was told to continue calcium and vitamin D supplements.

## 2019-04-05 NOTE — Patient Instructions (Addendum)
[facility] at [facility] Discharge Instructions  You were seen today by Dr. Katragadda. He went over your recent lab results. He will see you back in 1 year for labs and follow up.   Thank you for choosing  [facility] Center at [facility] to provide your oncology and hematology care.  To afford each patient quality time with our provider, please arrive at least 15 minutes before your scheduled appointment time.   If you have a lab appointment with the Cancer Center please come in thru the  Main Entrance and check in at the main information desk  You need to re-schedule your appointment should you arrive 10 or more minutes late.  We strive to give you quality time with our providers, and arriving late affects you and other patients whose appointments are after yours.  Also, if you no show three or more times for appointments you may be dismissed from the clinic at the providers discretion.     Again, thank you for choosing [facility].  Our hope is that these requests will decrease the amount of time that you wait before being seen by our physicians.       _____________________________________________________________  Should you have questions after your visit to [facility], please contact our office at [phone] between the hours of 8:00 a.m. and 4:30 p.m.  Voicemails left after 4:00 p.m. will not be returned until the following business day.  For prescription refill requests, have your pharmacy contact our office and allow 72 hours.    Cancer Center Support Programs:   > Cancer Support Group  2nd Tuesday of the month 1pm-2pm, Journey Room    

## 2019-04-05 NOTE — Progress Notes (Signed)
[place], [place] [address]   CLINIC:  Medical Oncology/Hematology  PCP:  Prince Solian, [place] [address] [phone]     CANCER STAGING: Cancer Staging Breast cancer of upper-outer quadrant of left female breast [facility]) Staging form: Breast, AJCC 7th Edition - Clinical: Stage 0 (Tis (DCIS), N0, cM0) - Unsigned - Pathologic: No stage assigned - Unsigned    INTERVAL HISTORY:  Ms. Dungan 85 y.o. female seen for follow-up of left breast DCIS.  Denies any lumps in her breast.  Denies any new onset pains.  Denies any fevers, night sweats or weight loss.  She lives at home with her husband and is independent of all ADLs and IADLs.  She is to work as a Theme park manager.  She reports decreased appetite.  Energy levels are 25%.    REVIEW OF SYSTEMS:  Review of Systems  Gastrointestinal: Positive for constipation.  Neurological: Positive for dizziness.  All other systems reviewed and are negative.    PAST MEDICAL/SURGICAL HISTORY:  Past Medical History:  Diagnosis Date  . Arrhythmia    left BBB,    PVC'S  . Cancer ([place]) 10/2013   LEFT BREAST CANCER--LUMPECTOMY  . DDD (degenerative disc disease)   . Diverticular disease    With colonic polyps  . Hiatal hernia   . Hyperlipidemia   . Hypertension     no meds    dr Radene Gunning  . Osteopenia    Past Surgical History:  Procedure Laterality Date  . APPENDECTOMY    . BREAST LUMPECTOMY WITH NEEDLE LOCALIZATION Left 11/25/2013   Procedure: LEFT BREAST WIRE GUIDED LUMPECTOMY;  Surgeon: Rolm Bookbinder, MD;  Location: [facility];  Service: General;  Laterality: Left;  . BREAST SURGERY    . CATARACT EXTRACTION    . HIP ARTHROPLASTY  06/21/2012   Procedure: ARTHROPLASTY BIPOLAR HIP;  Surgeon: Mauri Pole, MD;  Location: [facility];  Service: Orthopedics;  Laterality: Left;  . HIP ARTHROPLASTY Right 02/03/2017   Procedure: ARTHROPLASTY BIPOLAR HIP (HEMIARTHROPLASTY) RIGHT;  Surgeon:  Paralee Cancel, MD;  Location: [facility];  Service: Orthopedics;  Laterality: Right;     SOCIAL HISTORY:  Social History   Socioeconomic History  . Marital status: Married    Spouse name: Not on file  . Number of children: 2  . Years of education: Not on file  . Highest education level: Not on file  Occupational History  . Not on file  Social Needs  . Financial resource strain: Not on file  . Food insecurity:    Worry: Not on file    Inability: Not on file  . Transportation needs:    Medical: Not on file    Non-medical: Not on file  Tobacco Use  . Smoking status: Never Smoker  . Smokeless tobacco: Never Used  Substance and Sexual Activity  . Alcohol use: No  . Drug use: No  . Sexual activity: Never  Lifestyle  . Physical activity:    Days per week: Not on file    Minutes per session: Not on file  . Stress: Not on file  Relationships  . Social connections:    Talks on phone: Not on file    Gets together: Not on file    Attends religious service: Not on file    Active member of club or organization: Not on file    Attends meetings of clubs or organizations: Not on file    Relationship status: Not on  file  . Intimate partner violence:    Fear of current or ex partner: Not on file    Emotionally abused: Not on file    Physically abused: Not on file    Forced sexual activity: Not on file  Other Topics Concern  . Not on file  Social History Narrative   Lives with husband.      FAMILY HISTORY:  Family History  Problem Relation Age of Onset  . Heart Problems Mother        Died age 41, no details/Died age 11, no details  . CAD Brother 31  . CAD Brother 25    CURRENT MEDICATIONS:  Outpatient Encounter Medications as of 04/05/2019  Medication Sig  . acetaminophen (TYLENOL) 500 MG tablet Take 500 mg by mouth every 6 (six) hours as needed.  Marland Kitchen amoxicillin (AMOXIL) 500 MG tablet   . aspirin 81 MG chewable tablet Chew 1 tablet (81 mg total) by mouth 2 (two) times daily.  Take for 4 weeks, then resume regular dose.  . Calcium Carbonate-Vitamin D (CALCIUM PLUS VITAMIN D PO) Take by mouth daily.  Marland Kitchen donepezil (ARICEPT) 10 MG tablet Take 10 mg by mouth every evening.  . polyethylene glycol (MIRALAX / GLYCOLAX) packet Take 17 g by mouth daily.  Marland Kitchen senna (SENOKOT) 8.6 MG TABS tablet Take 1 tablet (8.6 mg total) by mouth daily.  . simvastatin (ZOCOR) 20 MG tablet Take 20 mg by mouth daily.   . traMADol (ULTRAM) 50 MG tablet tramadol 50 mg tablet  1 tablet qhs   No facility-administered encounter medications on file as of 04/05/2019.     ALLERGIES:  Allergies  Allergen Reactions  . Other      PHYSICAL EXAM:  ECOG Performance status: 2  Vitals:   04/05/19 1444  BP: (!) 164/57  Pulse: 69  Resp: 16  Temp: 98 F (36.7 C)  SpO2: 99%   Filed Weights   04/05/19 1444  Weight: 126 lb (57.2 kg)    Physical Exam Vitals signs reviewed.  Constitutional:      Appearance: Normal appearance.  Cardiovascular:     Rate and Rhythm: Normal rate and regular rhythm.     Heart sounds: Normal heart sounds.  Pulmonary:     Effort: Pulmonary effort is normal.     Breath sounds: Normal breath sounds.  Abdominal:     General: There is no distension.     Palpations: Abdomen is soft. There is no mass.  Skin:    General: Skin is warm.  Neurological:     General: No focal deficit present.     Mental Status: She is alert and oriented to person, place, and time.  Psychiatric:        Mood and Affect: Mood normal.        Behavior: Behavior normal.    Left breast lumpectomy site is within normal limits.  No palpable masses or adenopathy.  LABORATORY DATA:  I have reviewed the labs as listed.  CBC    Component Value Date/Time   WBC 9.3 02/09/2017 1030   RBC 4.03 02/09/2017 1030   HGB 12.6 02/09/2017 1030   HGB 15.1 11/16/2013 1209   HCT 37.7 02/09/2017 1030   HCT 44.8 11/16/2013 1209   PLT 260 02/09/2017 1030   PLT 242 11/16/2013 1209   MCV 93.5 02/09/2017  1030   MCV 90.5 11/16/2013 1209   MCH 31.3 02/09/2017 1030   MCHC 33.4 02/09/2017 1030   RDW 12.8 02/09/2017 1030  RDW 12.8 11/16/2013 1209   LYMPHSABS 1.3 02/02/2017 1104   LYMPHSABS 2.1 11/16/2013 1209   MONOABS 0.8 02/02/2017 1104   MONOABS 0.6 11/16/2013 1209   EOSABS 0.1 02/02/2017 1104   EOSABS 0.1 11/16/2013 1209   BASOSABS 0.0 02/02/2017 1104   BASOSABS 0.0 11/16/2013 1209   CMP Latest Ref Rng & Units 02/09/2017 02/05/2017 02/04/2017  Glucose 65 - 99 mg/dL 98 87 138(H)  BUN 6 - 20 mg/dL 18 17 17   Creatinine 0.44 - 1.00 mg/dL 0.79 0.92 0.88  Sodium 135 - 145 mmol/L 136 141 137  Potassium 3.5 - 5.1 mmol/L 4.5 4.2 4.4  Chloride 101 - 111 mmol/L 99(L) 108 107  CO2 22 - 32 mmol/L 31 28 24   Calcium 8.9 - 10.3 mg/dL 9.3 8.8(L) 8.5(L)  Total Protein 6.4 - 8.3 g/dL - - -  Total Bilirubin 0.20 - 1.20 mg/dL - - -  Alkaline Phos 40 - 150 U/L - - -  AST 5 - 34 U/L - - -  ALT 0 - 55 U/L - - -       DIAGNOSTIC IMAGING:  I have independently reviewed the scans and discussed with the patient.   I have reviewed Venita Lick LPN's note and agree with the documentation.  I personally performed a face-to-face visit, made revisions and my assessment and plan is as follows.    ASSESSMENT & PLAN:   Breast cancer of upper-outer quadrant of left female breast 1.  Left breast DCIS: -She had a lumpectomy on 11/25/2013.  This showed 1.8 cm high-grade DCIS with negative margins.  ER was positive and PR negative. - She denies feeling any lumps in her breasts. -Today's physical examination did not reveal any palpable masses.  Lumpectomy site is within normal range.  No axillary adenopathy. -We discussed the results of the mammogram dated 03/29/2019 which was BI-RADS Category 2. - We will see her back in 1 year for follow-up with repeat mammogram.  2.  Osteopenia: -DEXA scan in 2013 reportedly showed osteopenia. -She was told to continue calcium and vitamin D supplements.      Orders  placed this encounter:  Orders Placed This Encounter  Procedures  . MM DIAG BREAST TOMO BILATERAL      Derek Jack, MD [place] [phone]

## 2019-06-16 ENCOUNTER — Ambulatory Visit (INDEPENDENT_AMBULATORY_CARE_PROVIDER_SITE_OTHER): Payer: Medicare HMO | Admitting: Otolaryngology

## 2019-06-16 DIAGNOSIS — H6122 Impacted cerumen, left ear: Secondary | ICD-10-CM

## 2019-06-16 DIAGNOSIS — H903 Sensorineural hearing loss, bilateral: Secondary | ICD-10-CM

## 2019-08-18 DIAGNOSIS — R413 Other amnesia: Secondary | ICD-10-CM | POA: Diagnosis not present

## 2019-08-18 DIAGNOSIS — E785 Hyperlipidemia, unspecified: Secondary | ICD-10-CM | POA: Diagnosis not present

## 2019-08-18 DIAGNOSIS — G479 Sleep disorder, unspecified: Secondary | ICD-10-CM | POA: Diagnosis not present

## 2019-08-18 DIAGNOSIS — Q892 Congenital malformations of other endocrine glands: Secondary | ICD-10-CM | POA: Diagnosis not present

## 2019-08-18 DIAGNOSIS — Z8781 Personal history of (healed) traumatic fracture: Secondary | ICD-10-CM | POA: Diagnosis not present

## 2019-08-18 DIAGNOSIS — I10 Essential (primary) hypertension: Secondary | ICD-10-CM | POA: Diagnosis not present

## 2019-08-18 DIAGNOSIS — D0512 Intraductal carcinoma in situ of left breast: Secondary | ICD-10-CM | POA: Diagnosis not present

## 2019-10-13 DIAGNOSIS — I10 Essential (primary) hypertension: Secondary | ICD-10-CM | POA: Diagnosis not present

## 2019-10-13 DIAGNOSIS — K5900 Constipation, unspecified: Secondary | ICD-10-CM | POA: Diagnosis not present

## 2019-10-13 DIAGNOSIS — R413 Other amnesia: Secondary | ICD-10-CM | POA: Diagnosis not present

## 2019-10-13 DIAGNOSIS — G479 Sleep disorder, unspecified: Secondary | ICD-10-CM | POA: Diagnosis not present

## 2019-10-13 DIAGNOSIS — R634 Abnormal weight loss: Secondary | ICD-10-CM | POA: Diagnosis not present

## 2020-02-16 ENCOUNTER — Other Ambulatory Visit (HOSPITAL_COMMUNITY): Payer: Self-pay | Admitting: Hematology

## 2020-02-16 DIAGNOSIS — Z1231 Encounter for screening mammogram for malignant neoplasm of breast: Secondary | ICD-10-CM

## 2020-02-21 DIAGNOSIS — E7849 Other hyperlipidemia: Secondary | ICD-10-CM | POA: Diagnosis not present

## 2020-02-21 DIAGNOSIS — I10 Essential (primary) hypertension: Secondary | ICD-10-CM | POA: Diagnosis not present

## 2020-02-21 DIAGNOSIS — M810 Age-related osteoporosis without current pathological fracture: Secondary | ICD-10-CM | POA: Diagnosis not present

## 2020-02-21 DIAGNOSIS — Z0000 Encounter for general adult medical examination without abnormal findings: Secondary | ICD-10-CM | POA: Diagnosis not present

## 2020-02-28 DIAGNOSIS — K449 Diaphragmatic hernia without obstruction or gangrene: Secondary | ICD-10-CM | POA: Diagnosis not present

## 2020-02-28 DIAGNOSIS — D0512 Intraductal carcinoma in situ of left breast: Secondary | ICD-10-CM | POA: Diagnosis not present

## 2020-02-28 DIAGNOSIS — G479 Sleep disorder, unspecified: Secondary | ICD-10-CM | POA: Diagnosis not present

## 2020-02-28 DIAGNOSIS — E785 Hyperlipidemia, unspecified: Secondary | ICD-10-CM | POA: Diagnosis not present

## 2020-02-28 DIAGNOSIS — M810 Age-related osteoporosis without current pathological fracture: Secondary | ICD-10-CM | POA: Diagnosis not present

## 2020-02-28 DIAGNOSIS — R82998 Other abnormal findings in urine: Secondary | ICD-10-CM | POA: Diagnosis not present

## 2020-02-28 DIAGNOSIS — R634 Abnormal weight loss: Secondary | ICD-10-CM | POA: Diagnosis not present

## 2020-02-28 DIAGNOSIS — Z0000 Encounter for general adult medical examination without abnormal findings: Secondary | ICD-10-CM | POA: Diagnosis not present

## 2020-02-28 DIAGNOSIS — R413 Other amnesia: Secondary | ICD-10-CM | POA: Diagnosis not present

## 2020-02-28 DIAGNOSIS — I10 Essential (primary) hypertension: Secondary | ICD-10-CM | POA: Diagnosis not present

## 2020-04-02 ENCOUNTER — Ambulatory Visit (HOSPITAL_COMMUNITY)
Admission: RE | Admit: 2020-04-02 | Discharge: 2020-04-02 | Disposition: A | Discharge status: Home / Self Care | Payer: Medicare HMO | Source: Ambulatory Visit | Attending: Hematology | Admitting: Hematology

## 2020-04-02 ENCOUNTER — Other Ambulatory Visit: Payer: Self-pay

## 2020-04-02 DIAGNOSIS — Z1231 Encounter for screening mammogram for malignant neoplasm of breast: Secondary | ICD-10-CM | POA: Insufficient documentation

## 2020-04-03 ENCOUNTER — Encounter (HOSPITAL_COMMUNITY): Payer: Medicare HMO

## 2020-04-05 ENCOUNTER — Inpatient Hospital Stay (HOSPITAL_COMMUNITY): Payer: Medicare HMO | Attending: Hematology | Admitting: Nurse Practitioner

## 2020-04-05 ENCOUNTER — Other Ambulatory Visit: Payer: Self-pay

## 2020-04-05 VITALS — BP 132/58 | HR 74 | Temp 97.6°F | Resp 17 | Wt 117.2 lb

## 2020-04-05 DIAGNOSIS — E785 Hyperlipidemia, unspecified: Secondary | ICD-10-CM | POA: Diagnosis not present

## 2020-04-05 DIAGNOSIS — C50412 Malignant neoplasm of upper-outer quadrant of left female breast: Secondary | ICD-10-CM | POA: Diagnosis not present

## 2020-04-05 DIAGNOSIS — M8580 Other specified disorders of bone density and structure, unspecified site: Secondary | ICD-10-CM | POA: Insufficient documentation

## 2020-04-05 DIAGNOSIS — Z1231 Encounter for screening mammogram for malignant neoplasm of breast: Secondary | ICD-10-CM | POA: Diagnosis not present

## 2020-04-05 DIAGNOSIS — Z7982 Long term (current) use of aspirin: Secondary | ICD-10-CM | POA: Insufficient documentation

## 2020-04-05 DIAGNOSIS — I10 Essential (primary) hypertension: Secondary | ICD-10-CM | POA: Insufficient documentation

## 2020-04-05 DIAGNOSIS — Z79899 Other long term (current) drug therapy: Secondary | ICD-10-CM | POA: Diagnosis not present

## 2020-04-05 DIAGNOSIS — Z170 Estrogen receptor positive status [ER+]: Secondary | ICD-10-CM | POA: Insufficient documentation

## 2020-04-05 DIAGNOSIS — Z8249 Family history of ischemic heart disease and other diseases of the circulatory system: Secondary | ICD-10-CM | POA: Diagnosis not present

## 2020-04-05 NOTE — Progress Notes (Signed)
[address] [place], [place] [address]   CLINIC:  Medical Oncology/Hematology  PCP:  Prince Solian, [place] [address] [phone]   REASON FOR VISIT: Follow-up for DCIS  CURRENT THERAPY: Surveillance per NCCN guidelines  CANCER STAGING: Cancer Staging Breast cancer of upper-outer quadrant of left female breast [facility]) Staging form: Breast, AJCC 7th Edition - Clinical: Stage 0 (Tis (DCIS), N0, cM0) - Unsigned - Pathologic: No stage assigned - Unsigned    INTERVAL HISTORY:  Sarah Moyer 86 y.o. female returns for routine follow-up for DCIS.  Patient reports she is doing well since her last visit.  She reports she wishes for her PCP to follow her mammograms and labs.  She has had no issues since last time she saw [place]. Denies any nausea, vomiting, or diarrhea. Denies any new pains. Had not noticed any recent bleeding such as epistaxis, hematuria or hematochezia. Denies recent chest pain on exertion, shortness of breath on minimal exertion, pre-syncopal episodes, or palpitations. Denies any numbness or tingling in hands or feet. Denies any recent fevers, infections, or recent hospitalizations. Patient reports appetite at 25% and energy level at 25%.     REVIEW OF SYSTEMS:  Review of Systems  Gastrointestinal: Positive for constipation.  Psychiatric/Behavioral: Positive for sleep disturbance.  All other systems reviewed and are negative.    PAST MEDICAL/SURGICAL HISTORY:  Past Medical History:  Diagnosis Date   Arrhythmia    left BBB,    PVC'S   Cancer ([place]) 10/2013   LEFT BREAST CANCER--LUMPECTOMY   DDD (degenerative disc disease)    Diverticular disease    With colonic polyps   Hiatal hernia    Hyperlipidemia    Hypertension     no meds    dr Radene Gunning   Osteopenia    Past Surgical History:  Procedure Laterality Date   APPENDECTOMY     BREAST LUMPECTOMY WITH NEEDLE LOCALIZATION Left 11/25/2013   Procedure:  LEFT BREAST WIRE GUIDED LUMPECTOMY;  Surgeon: Rolm Bookbinder, MD;  Location: [facility];  Service: General;  Laterality: Left;   BREAST SURGERY     CATARACT EXTRACTION     HIP ARTHROPLASTY  06/21/2012   Procedure: ARTHROPLASTY BIPOLAR HIP;  Surgeon: Mauri Pole, MD;  [facility];  Service: Orthopedics;  Laterality: Left;   HIP ARTHROPLASTY Right 02/03/2017   Procedure: ARTHROPLASTY BIPOLAR HIP (HEMIARTHROPLASTY) RIGHT;  Surgeon: Paralee Cancel, MD;  [facility];  Service: Orthopedics;  Laterality: Right;     SOCIAL HISTORY:  Social History   Socioeconomic History   Marital status: Married    Spouse name: Not on file   Number of children: 2   Years of education: Not on file   Highest education level: Not on file  Occupational History   Not on file  Tobacco Use   Smoking status: Never Smoker   Smokeless tobacco: Never Used  Vaping Use   Vaping Use: Never used  Substance and Sexual Activity   Alcohol use: No   Drug use: No   Sexual activity: Never  Other Topics Concern   Not on file  Social History Narrative   Lives with husband.     Social Determinants of Health   Financial Resource Strain:    Difficulty of Paying Living Expenses:   Food Insecurity:    Worried About Charity fundraiser in the Last Year:    Arboriculturist in the Last Year:   Transportation Needs:  Lack of Transportation (Medical):    Lack of Transportation (Non-Medical):   Physical Activity:    Days of Exercise per Week:    Minutes of Exercise per Session:   Stress:    Feeling of Stress :   Social Connections:    Frequency of Communication with Friends and Family:    Frequency of Social Gatherings with Friends and Family:    Attends Religious Services:    Active Member of Clubs or Organizations:    Attends Music therapist:    Marital Status:   Intimate Partner Violence:    Fear of Current or Ex-Partner:    Emotionally Abused:     Physically Abused:    Sexually Abused:     FAMILY HISTORY:  Family History  Problem Relation Age of Onset   Heart Problems Mother        Died age 65, no details/Died age 59, no details   CAD Brother 26   CAD Brother 2    CURRENT MEDICATIONS:  Outpatient Encounter Medications as of 04/05/2020  Medication Sig   aspirin 81 MG chewable tablet Chew 1 tablet (81 mg total) by mouth 2 (two) times daily. Take for 4 weeks, then resume regular dose.   Calcium Carbonate-Vitamin D (CALCIUM PLUS VITAMIN D PO) Take by mouth daily.   donepezil (ARICEPT) 10 MG tablet Take 10 mg by mouth every evening.   polyethylene glycol (MIRALAX / GLYCOLAX) packet Take 17 g by mouth daily.   senna (SENOKOT) 8.6 MG TABS tablet Take 1 tablet (8.6 mg total) by mouth daily.   simvastatin (ZOCOR) 20 MG tablet Take 20 mg by mouth daily.    traMADol (ULTRAM) 50 MG tablet tramadol 50 mg tablet  1 tablet qhs   acetaminophen (TYLENOL) 500 MG tablet Take 500 mg by mouth every 6 (six) hours as needed. (Patient not taking: Reported on 04/05/2020)   amoxicillin (AMOXIL) 500 MG tablet  (Patient not taking: Reported on 04/05/2020)   mirtazapine (REMERON) 15 MG tablet    No facility-administered encounter medications on file as of 04/05/2020.    ALLERGIES:  Allergies  Allergen Reactions   Other      PHYSICAL EXAM:  ECOG Performance status: 1  Vitals:   04/05/20 1328  BP: (!) 132/58  Pulse: 74  Resp: 17  Temp: 97.6 F (36.4 C)  SpO2: 99%   Filed Weights   04/05/20 1328  Weight: 117 lb 3.2 oz (53.2 kg)   Physical Exam Constitutional:      Appearance: Normal appearance. She is normal weight.  Cardiovascular:     Rate and Rhythm: Normal rate and regular rhythm.     Heart sounds: Normal heart sounds.  Pulmonary:     Effort: Pulmonary effort is normal.     Breath sounds: Normal breath sounds.  Abdominal:     General: Bowel sounds are normal.     Palpations: Abdomen is soft.  Musculoskeletal:         General: Normal range of motion.  Skin:    General: Skin is warm.  Neurological:     Mental Status: She is alert and oriented to person, place, and time. Mental status is at baseline.  Psychiatric:        Mood and Affect: Mood normal.        Behavior: Behavior normal.        Thought Content: Thought content normal.        Judgment: Judgment normal.      LABORATORY DATA:  I have reviewed the labs as listed.  CBC    Component Value Date/Time   WBC 9.3 02/09/2017 1030   RBC 4.03 02/09/2017 1030   HGB 12.6 02/09/2017 1030   HGB 15.1 11/16/2013 1209   HCT 37.7 02/09/2017 1030   HCT 44.8 11/16/2013 1209   PLT 260 02/09/2017 1030   PLT 242 11/16/2013 1209   MCV 93.5 02/09/2017 1030   MCV 90.5 11/16/2013 1209   MCH 31.3 02/09/2017 1030   MCHC 33.4 02/09/2017 1030   RDW 12.8 02/09/2017 1030   RDW 12.8 11/16/2013 1209   LYMPHSABS 1.3 02/02/2017 1104   LYMPHSABS 2.1 11/16/2013 1209   MONOABS 0.8 02/02/2017 1104   MONOABS 0.6 11/16/2013 1209   EOSABS 0.1 02/02/2017 1104   EOSABS 0.1 11/16/2013 1209   BASOSABS 0.0 02/02/2017 1104   BASOSABS 0.0 11/16/2013 1209   CMP Latest Ref Rng & Units 02/09/2017 02/05/2017 02/04/2017  Glucose 65 - 99 mg/dL 98 87 138(H)  BUN 6 - 20 mg/dL 18 17 17   Creatinine 0.44 - 1.00 mg/dL 0.79 0.92 0.88  Sodium 135 - 145 mmol/L 136 141 137  Potassium 3.5 - 5.1 mmol/L 4.5 4.2 4.4  Chloride 101 - 111 mmol/L 99(L) 108 107  CO2 22 - 32 mmol/L 31 28 24   Calcium 8.9 - 10.3 mg/dL 9.3 8.8(L) 8.5(L)  Total Protein 6.4 - 8.3 g/dL - - -  Total Bilirubin 0.20 - 1.20 mg/dL - - -  Alkaline Phos 40 - 150 U/L - - -  AST 5 - 34 U/L - - -  ALT 0 - 55 U/L - - -    DIAGNOSTIC IMAGING:  I have independently reviewed the mammogram scans and discussed with the patient.  ASSESSMENT & PLAN:  Breast cancer of upper-outer quadrant of left female breast 1.  Left breast DCIS: -She had a lumpectomy on 11/25/2013.  This showed 1.8 cm high-grade DCIS with negative  margins.  ER was positive and PR was negative. -Today's physical examination did not reveal any palpable masses.  Lumpectomy site was within normal range.  No axillary adenopathy. -Last mammogram was done on 04/02/2020 which was BI-RADS Category 1 negative -Patient did not have labs done this year. -Patient wishes to follow-up with mammograms and labs at her PCP.  She will call us if she ever needs [place] for anything.  2.  Osteopenia: -DEXA scan in 2013 reportedly showed osteopenia. -She was told to continue taking calcium and vitamin D daily.     Orders placed this encounter:  Orders Placed This Encounter  Procedures   MM 3D SCREEN BREAST BILATERAL   Lactate dehydrogenase   CBC with Differential/Platelet   Comprehensive metabolic panel   Ferritin   Iron and TIBC   Vitamin B12   VITAMIN D 25 Hydroxy (Vit-D Deficiency, Fractures)   Folate      Francene Finders, FNP-C [place] [phone]

## 2020-04-05 NOTE — Assessment & Plan Note (Addendum)
1.  Left breast DCIS: -She had a lumpectomy on 11/25/2013.  This showed 1.8 cm high-grade DCIS with negative margins.  ER was positive and PR was negative. -Today's physical examination did not reveal any palpable masses.  Lumpectomy site was within normal range.  No axillary adenopathy. -Last mammogram was done on 04/02/2020 which was BI-RADS Category 1 negative -Patient did not have labs done this year. -Patient wishes to follow-up with mammograms and labs at her PCP.  She will call us if she ever needs [place] for anything.  2.  Osteopenia: -DEXA scan in 2013 reportedly showed osteopenia. -She was told to continue taking calcium and vitamin D daily.

## 2020-04-25 DIAGNOSIS — H6122 Impacted cerumen, left ear: Secondary | ICD-10-CM | POA: Diagnosis not present

## 2020-04-25 DIAGNOSIS — H903 Sensorineural hearing loss, bilateral: Secondary | ICD-10-CM | POA: Diagnosis not present

## 2020-05-24 DIAGNOSIS — Z23 Encounter for immunization: Secondary | ICD-10-CM | POA: Diagnosis not present

## 2020-05-24 DIAGNOSIS — S81811A Laceration without foreign body, right lower leg, initial encounter: Secondary | ICD-10-CM | POA: Diagnosis not present

## 2020-05-24 DIAGNOSIS — Z7982 Long term (current) use of aspirin: Secondary | ICD-10-CM | POA: Diagnosis not present

## 2020-06-18 DIAGNOSIS — S81801A Unspecified open wound, right lower leg, initial encounter: Secondary | ICD-10-CM | POA: Diagnosis not present

## 2020-06-18 DIAGNOSIS — M79604 Pain in right leg: Secondary | ICD-10-CM | POA: Diagnosis not present

## 2020-06-27 DIAGNOSIS — S81801S Unspecified open wound, right lower leg, sequela: Secondary | ICD-10-CM | POA: Diagnosis not present

## 2020-06-27 DIAGNOSIS — M79604 Pain in right leg: Secondary | ICD-10-CM | POA: Diagnosis not present

## 2020-07-13 DIAGNOSIS — H5200 Hypermetropia, unspecified eye: Secondary | ICD-10-CM | POA: Diagnosis not present

## 2020-07-13 DIAGNOSIS — Z0100 Encounter for examination of eyes and vision without abnormal findings: Secondary | ICD-10-CM | POA: Diagnosis not present

## 2020-08-29 DIAGNOSIS — J302 Other seasonal allergic rhinitis: Secondary | ICD-10-CM | POA: Diagnosis not present

## 2020-08-29 DIAGNOSIS — K5901 Slow transit constipation: Secondary | ICD-10-CM | POA: Diagnosis not present

## 2020-08-29 DIAGNOSIS — K449 Diaphragmatic hernia without obstruction or gangrene: Secondary | ICD-10-CM | POA: Diagnosis not present

## 2020-08-29 DIAGNOSIS — R413 Other amnesia: Secondary | ICD-10-CM | POA: Diagnosis not present

## 2020-08-29 DIAGNOSIS — R634 Abnormal weight loss: Secondary | ICD-10-CM | POA: Diagnosis not present

## 2020-08-29 DIAGNOSIS — M810 Age-related osteoporosis without current pathological fracture: Secondary | ICD-10-CM | POA: Diagnosis not present

## 2020-08-29 DIAGNOSIS — I10 Essential (primary) hypertension: Secondary | ICD-10-CM | POA: Diagnosis not present

## 2020-08-29 DIAGNOSIS — E785 Hyperlipidemia, unspecified: Secondary | ICD-10-CM | POA: Diagnosis not present

## 2020-08-29 DIAGNOSIS — Z8781 Personal history of (healed) traumatic fracture: Secondary | ICD-10-CM | POA: Diagnosis not present

## 2021-01-07 DIAGNOSIS — I499 Cardiac arrhythmia, unspecified: Secondary | ICD-10-CM | POA: Diagnosis not present

## 2021-01-07 DIAGNOSIS — I10 Essential (primary) hypertension: Secondary | ICD-10-CM | POA: Diagnosis not present

## 2021-01-07 DIAGNOSIS — I951 Orthostatic hypotension: Secondary | ICD-10-CM | POA: Diagnosis not present

## 2021-01-07 DIAGNOSIS — E785 Hyperlipidemia, unspecified: Secondary | ICD-10-CM | POA: Diagnosis not present

## 2021-01-07 DIAGNOSIS — R42 Dizziness and giddiness: Secondary | ICD-10-CM | POA: Diagnosis not present

## 2021-01-07 DIAGNOSIS — N390 Urinary tract infection, site not specified: Secondary | ICD-10-CM | POA: Diagnosis not present

## 2021-02-21 ENCOUNTER — Other Ambulatory Visit (HOSPITAL_COMMUNITY): Payer: Self-pay | Admitting: Internal Medicine

## 2021-02-21 DIAGNOSIS — Z1231 Encounter for screening mammogram for malignant neoplasm of breast: Secondary | ICD-10-CM

## 2021-04-08 ENCOUNTER — Other Ambulatory Visit: Payer: Self-pay

## 2021-04-08 ENCOUNTER — Ambulatory Visit (HOSPITAL_COMMUNITY)
Admission: RE | Admit: 2021-04-08 | Discharge: 2021-04-08 | Disposition: A | Discharge status: Home / Self Care | Payer: Medicare HMO | Source: Ambulatory Visit | Attending: Internal Medicine | Admitting: Internal Medicine

## 2021-04-08 DIAGNOSIS — Z1231 Encounter for screening mammogram for malignant neoplasm of breast: Secondary | ICD-10-CM | POA: Insufficient documentation

## 2021-05-27 DIAGNOSIS — H903 Sensorineural hearing loss, bilateral: Secondary | ICD-10-CM | POA: Diagnosis not present

## 2021-05-27 DIAGNOSIS — H6122 Impacted cerumen, left ear: Secondary | ICD-10-CM | POA: Diagnosis not present

## 2021-06-07 DIAGNOSIS — M810 Age-related osteoporosis without current pathological fracture: Secondary | ICD-10-CM | POA: Diagnosis not present

## 2021-06-07 DIAGNOSIS — Z79899 Other long term (current) drug therapy: Secondary | ICD-10-CM | POA: Diagnosis not present

## 2021-06-07 DIAGNOSIS — G309 Alzheimer's disease, unspecified: Secondary | ICD-10-CM | POA: Diagnosis not present

## 2021-06-07 DIAGNOSIS — I951 Orthostatic hypotension: Secondary | ICD-10-CM | POA: Diagnosis not present

## 2021-06-07 DIAGNOSIS — Z0001 Encounter for general adult medical examination with abnormal findings: Secondary | ICD-10-CM | POA: Diagnosis not present

## 2021-06-07 DIAGNOSIS — E785 Hyperlipidemia, unspecified: Secondary | ICD-10-CM | POA: Diagnosis not present

## 2021-06-14 DIAGNOSIS — E785 Hyperlipidemia, unspecified: Secondary | ICD-10-CM | POA: Diagnosis not present

## 2021-06-14 DIAGNOSIS — Z853 Personal history of malignant neoplasm of breast: Secondary | ICD-10-CM | POA: Diagnosis not present

## 2021-06-14 DIAGNOSIS — G309 Alzheimer's disease, unspecified: Secondary | ICD-10-CM | POA: Diagnosis not present

## 2021-06-14 DIAGNOSIS — I951 Orthostatic hypotension: Secondary | ICD-10-CM | POA: Diagnosis not present

## 2021-06-14 DIAGNOSIS — M810 Age-related osteoporosis without current pathological fracture: Secondary | ICD-10-CM | POA: Diagnosis not present

## 2021-06-14 DIAGNOSIS — C44601 Unspecified malignant neoplasm of skin of unspecified upper limb, including shoulder: Secondary | ICD-10-CM | POA: Diagnosis not present

## 2021-06-18 ENCOUNTER — Other Ambulatory Visit (HOSPITAL_COMMUNITY): Payer: Self-pay | Admitting: Internal Medicine

## 2021-06-18 ENCOUNTER — Other Ambulatory Visit: Payer: Self-pay | Admitting: Internal Medicine

## 2021-06-18 DIAGNOSIS — R410 Disorientation, unspecified: Secondary | ICD-10-CM

## 2021-06-19 ENCOUNTER — Encounter (HOSPITAL_COMMUNITY): Payer: Self-pay | Admitting: Emergency Medicine

## 2021-06-19 ENCOUNTER — Emergency Department (HOSPITAL_COMMUNITY)
Admission: EM | Admit: 2021-06-19 | Discharge: 2021-06-19 | Disposition: A | Discharge status: Home / Self Care | Payer: Medicare HMO | Attending: Emergency Medicine | Admitting: Emergency Medicine

## 2021-06-19 ENCOUNTER — Other Ambulatory Visit: Payer: Self-pay

## 2021-06-19 DIAGNOSIS — Z79899 Other long term (current) drug therapy: Secondary | ICD-10-CM | POA: Diagnosis not present

## 2021-06-19 DIAGNOSIS — Z7982 Long term (current) use of aspirin: Secondary | ICD-10-CM | POA: Diagnosis not present

## 2021-06-19 DIAGNOSIS — I10 Essential (primary) hypertension: Secondary | ICD-10-CM | POA: Diagnosis not present

## 2021-06-19 DIAGNOSIS — Z853 Personal history of malignant neoplasm of breast: Secondary | ICD-10-CM | POA: Insufficient documentation

## 2021-06-19 DIAGNOSIS — I959 Hypotension, unspecified: Secondary | ICD-10-CM | POA: Diagnosis not present

## 2021-06-19 DIAGNOSIS — E860 Dehydration: Secondary | ICD-10-CM | POA: Diagnosis not present

## 2021-06-19 DIAGNOSIS — R0689 Other abnormalities of breathing: Secondary | ICD-10-CM | POA: Diagnosis not present

## 2021-06-19 DIAGNOSIS — R410 Disorientation, unspecified: Secondary | ICD-10-CM | POA: Diagnosis not present

## 2021-06-19 DIAGNOSIS — F0390 Unspecified dementia without behavioral disturbance: Secondary | ICD-10-CM | POA: Diagnosis not present

## 2021-06-19 DIAGNOSIS — Z96643 Presence of artificial hip joint, bilateral: Secondary | ICD-10-CM | POA: Diagnosis not present

## 2021-06-19 DIAGNOSIS — I440 Atrioventricular block, first degree: Secondary | ICD-10-CM | POA: Diagnosis not present

## 2021-06-19 LAB — COMPREHENSIVE METABOLIC PANEL
ALT: 12 U/L (ref 0–44)
AST: 15 U/L (ref 15–41)
Albumin: 3.8 g/dL (ref 3.5–5.0)
Alkaline Phosphatase: 67 U/L (ref 38–126)
Anion gap: 8 (ref 5–15)
BUN: 12 mg/dL (ref 8–23)
CO2: 23 mmol/L (ref 22–32)
Calcium: 9 mg/dL (ref 8.9–10.3)
Chloride: 108 mmol/L (ref 98–111)
Creatinine, Ser: 0.82 mg/dL (ref 0.44–1.00)
GFR, Estimated: >60 mL/min (ref >60)
Glucose, Bld: 98 mg/dL (ref 70–99)
Potassium: 3.6 mmol/L (ref 3.5–5.1)
Sodium: 139 mmol/L (ref 135–145)
Total Bilirubin: 0.7 mg/dL (ref 0.3–1.2)
Total Protein: 6.4 g/dL — ABNORMAL LOW (ref 6.5–8.1)

## 2021-06-19 LAB — URINALYSIS, ROUTINE W REFLEX MICROSCOPIC
Bilirubin Urine: NEGATIVE
Glucose, UA: NEGATIVE mg/dL
Ketones, ur: NEGATIVE mg/dL
Leukocytes,Ua: NEGATIVE
Nitrite: NEGATIVE
Protein, ur: NEGATIVE mg/dL
Specific Gravity, Urine: 1.005 (ref 1.005–1.030)
pH: 7 (ref 5.0–8.0)

## 2021-06-19 LAB — CBC
HCT: 43.6 % (ref 36.0–46.0)
Hemoglobin: 15 g/dL (ref 12.0–15.0)
MCH: 31.8 pg (ref 26.0–34.0)
MCHC: 34.4 g/dL (ref 30.0–36.0)
MCV: 92.6 fL (ref 80.0–100.0)
Platelets: 250 10*3/uL (ref 150–400)
RBC: 4.71 MIL/uL (ref 3.87–5.11)
RDW: 12.3 % (ref 11.5–15.5)
WBC: 8.1 10*3/uL (ref 4.0–10.5)
nRBC: 0 % (ref 0.0–0.2)

## 2021-06-19 NOTE — ED Triage Notes (Signed)
Pt to the ED [facility] from home for altered mental status for the past 3-4 days. Patient has a history of dementia and seems to have started sundowning.  Pt has new onset tremors.

## 2021-06-19 NOTE — ED Provider Notes (Signed)
[facility] EMERGENCY DEPARTMENT Provider Note   CSN: [id number] Arrival date & time: 06/19/21  1739     History Chief Complaint  Patient presents with   Altered Mental Status    Sarah Moyer is a 87 y.o. female.  Patient with history of dementia presents with episodes of confusion.  Family is at bedside, they state that these episodes come and go.  She had a bad episode prior to arrival but appears back to baseline currently per family.  Otherwise the patient states she has no pain no headache no chest pain abdominal pain.  No fever no cough no vomiting no diarrhea reported.      Past Medical History:  Diagnosis Date   Arrhythmia    left BBB,    PVC'S   Cancer ([place]) 10/2013   LEFT BREAST CANCER--LUMPECTOMY   DDD (degenerative disc disease)    Diverticular disease    With colonic polyps   Hiatal hernia    Hyperlipidemia    Hypertension     no meds    dr Radene Gunning   Osteopenia     Patient Active Problem List   Diagnosis Date Noted   Closed right hip fracture ([place]) 02/02/2017   Dementia ([place]) 02/02/2017   Left upper quadrant pain 12/10/2015   History of left hip replacement 06/12/2014   Osteoporosis 06/12/2014   Dizziness 02/15/2014   Ductal carcinoma in situ (DCIS) of left breast 12/23/2013   Breast cancer of upper-outer quadrant of left female breast ([place]) 11/09/2013   Hyperlipidemia 06/20/2012   Leukocytosis 06/20/2012   Fracture of femoral neck, left, closed ([place]) 06/20/2012   Essential hypertension, benign 09/12/2010   DYSPNEA 09/12/2010   ELECTROCARDIOGRAM, ABNORMAL 09/12/2010    Past Surgical History:  Procedure Laterality Date   APPENDECTOMY     BREAST LUMPECTOMY WITH NEEDLE LOCALIZATION Left 11/25/2013   Procedure: LEFT BREAST WIRE GUIDED LUMPECTOMY;  Surgeon: Rolm Bookbinder, MD;  Location: [place];  Service: General;  Laterality: Left;   BREAST SURGERY     CATARACT EXTRACTION     HIP ARTHROPLASTY  06/21/2012   Procedure: ARTHROPLASTY BIPOLAR HIP;   Surgeon: Mauri Pole, MD;  Location: [facility];  Service: Orthopedics;  Laterality: Left;   HIP ARTHROPLASTY Right 02/03/2017   Procedure: ARTHROPLASTY BIPOLAR HIP (HEMIARTHROPLASTY) RIGHT;  Surgeon: Paralee Cancel, MD;  Location: [facility];  Service: Orthopedics;  Laterality: Right;     OB History   No obstetric history on file.     Family History  Problem Relation Age of Onset   Heart Problems Mother        Died age 26, no details/Died age 84, no details   CAD Brother 72   CAD Brother 103    Social History   Tobacco Use   Smoking status: Never   Smokeless tobacco: Never  Vaping Use   Vaping Use: Never used  Substance Use Topics   Alcohol use: No   Drug use: No    Home Medications Prior to Admission medications   Medication Sig Start Date End Date Taking? Authorizing Provider  aspirin 81 MG chewable tablet Chew 81 mg by mouth daily.   Yes [provider]  donepezil (ARICEPT ODT) 10 MG disintegrating tablet Take 10 mg by mouth at bedtime.   Yes [provider]  mirtazapine (REMERON) 15 MG tablet Take 15 mg by mouth at bedtime. 03/07/20  Yes [provider]  simvastatin (ZOCOR) 20 MG tablet Take 20 mg by mouth daily.  Yes [provider]    Allergies    Other  Review of Systems   Review of Systems  Constitutional:  Negative for fever.  HENT:  Negative for ear pain.   Eyes:  Negative for pain.  Respiratory:  Negative for cough.   Cardiovascular:  Negative for chest pain.  Gastrointestinal:  Negative for abdominal pain.  Genitourinary:  Negative for flank pain.  Musculoskeletal:  Negative for back pain.  Skin:  Negative for rash.  Neurological:  Negative for headaches.   Physical Exam Updated Vital Signs BP (!) 171/92 (BP Location: Left Arm)   Pulse 67   Temp 98 F (36.7 C) (Oral)   Resp 18   Ht '5\' 4"'$  (1.626 m)   Wt 53.2 kg   SpO2 98%   BMI 20.13 kg/m   Physical Exam Constitutional:      General: She is not in acute  distress.    Appearance: Normal appearance.  HENT:     Head: Normocephalic.     Nose: Nose normal.  Eyes:     Extraocular Movements: Extraocular movements intact.  Cardiovascular:     Rate and Rhythm: Normal rate.  Pulmonary:     Effort: Pulmonary effort is normal.  Musculoskeletal:        General: Normal range of motion.     Cervical back: Normal range of motion.  Neurological:     General: No focal deficit present.     Mental Status: She is alert. Mental status is at baseline.     Comments: Patient is awake and alert following commands appears appropriate.    ED Results / Procedures / Treatments   Labs (all labs ordered are listed, but only abnormal results are displayed) Labs Reviewed  COMPREHENSIVE METABOLIC PANEL - Abnormal; Notable for the following components:      Result Value   Total Protein 6.4 (*)    All other components within normal limits  URINALYSIS, ROUTINE W REFLEX MICROSCOPIC - Abnormal; Notable for the following components:   Color, Urine STRAW (*)    Hgb urine dipstick SMALL (*)    Bacteria, UA RARE (*)    All other components within normal limits  CBC    EKG EKG Interpretation  Date/Time:  Wednesday June 19 2021 19:54:58 EDT Ventricular Rate:  62 PR Interval:  236 QRS Duration: 138 QT Interval:  466 QTC Calculation: 474 R Axis:   1 Text Interpretation: Sinus rhythm Ventricular premature complex Prolonged PR interval Left bundle branch block Confirmed by Thamas Jaegers ([id number]) on 06/19/2021 8:11:07 PM  Radiology No results found.  Procedures Procedures   Medications Ordered in ED Medications - No data to display  ED Course  I have reviewed the triage vital signs and the nursing notes.  Pertinent labs & imaging results that were available during my care of the patient were reviewed by me and considered in my medical decision making (see chart for details).    MDM Rules/Calculators/A&P                           Patient appears very  alert awake and oriented to place and person.  Family states that this is when she is doing well but at times she does much worse.  She was observed in the ER for several hours with no additional adverse events.  Work-up is otherwise unremarkable.  She has no complaints of pain, will be advised outpatient follow-up with her doctor within  the week.  Advised immediate return for worsening symptoms fevers or any additional concerns.  Final Clinical Impression(s) / ED Diagnoses Final diagnoses:  Dementia without behavioral disturbance, unspecified dementia type [facility])    Rx / DC Orders ED Discharge Orders     None        Luna Fuse, MD 06/19/21 2017

## 2021-06-19 NOTE — Discharge Instructions (Addendum)
Your test today did not show any concerning findings.  Follow-up with your primary care doctor within the week.  Return if you have fevers worsening symptoms or any additional concerns.

## 2021-06-20 ENCOUNTER — Emergency Department (HOSPITAL_COMMUNITY): Payer: Medicare HMO

## 2021-06-20 ENCOUNTER — Encounter (HOSPITAL_COMMUNITY): Payer: Self-pay | Admitting: *Deleted

## 2021-06-20 ENCOUNTER — Observation Stay (HOSPITAL_COMMUNITY)
Admission: EM | Admit: 2021-06-20 | Discharge: 2021-06-25 | Disposition: A | Discharge status: Home / Self Care | Payer: Medicare HMO | Attending: Emergency Medicine | Admitting: Emergency Medicine

## 2021-06-20 DIAGNOSIS — Z20822 Contact with and (suspected) exposure to covid-19: Secondary | ICD-10-CM | POA: Insufficient documentation

## 2021-06-20 DIAGNOSIS — R531 Weakness: Secondary | ICD-10-CM | POA: Insufficient documentation

## 2021-06-20 DIAGNOSIS — M25511 Pain in right shoulder: Secondary | ICD-10-CM

## 2021-06-20 DIAGNOSIS — R4182 Altered mental status, unspecified: Secondary | ICD-10-CM

## 2021-06-20 DIAGNOSIS — G9340 Encephalopathy, unspecified: Secondary | ICD-10-CM | POA: Diagnosis not present

## 2021-06-20 DIAGNOSIS — I10 Essential (primary) hypertension: Secondary | ICD-10-CM | POA: Insufficient documentation

## 2021-06-20 DIAGNOSIS — Z96643 Presence of artificial hip joint, bilateral: Secondary | ICD-10-CM | POA: Insufficient documentation

## 2021-06-20 DIAGNOSIS — F0390 Unspecified dementia without behavioral disturbance: Secondary | ICD-10-CM | POA: Diagnosis not present

## 2021-06-20 DIAGNOSIS — R627 Adult failure to thrive: Secondary | ICD-10-CM | POA: Diagnosis not present

## 2021-06-20 DIAGNOSIS — Z7982 Long term (current) use of aspirin: Secondary | ICD-10-CM | POA: Insufficient documentation

## 2021-06-20 DIAGNOSIS — Z79899 Other long term (current) drug therapy: Secondary | ICD-10-CM | POA: Diagnosis not present

## 2021-06-20 DIAGNOSIS — Z853 Personal history of malignant neoplasm of breast: Secondary | ICD-10-CM | POA: Insufficient documentation

## 2021-06-20 DIAGNOSIS — R410 Disorientation, unspecified: Secondary | ICD-10-CM | POA: Diagnosis present

## 2021-06-20 DIAGNOSIS — I517 Cardiomegaly: Secondary | ICD-10-CM | POA: Diagnosis not present

## 2021-06-20 LAB — COMPREHENSIVE METABOLIC PANEL
ALT: 12 U/L (ref 0–44)
AST: 14 U/L — ABNORMAL LOW (ref 15–41)
Albumin: 3.7 g/dL (ref 3.5–5.0)
Alkaline Phosphatase: 59 U/L (ref 38–126)
Anion gap: 5 (ref 5–15)
BUN: 11 mg/dL (ref 8–23)
CO2: 27 mmol/L (ref 22–32)
Calcium: 9 mg/dL (ref 8.9–10.3)
Chloride: 108 mmol/L (ref 98–111)
Creatinine, Ser: 0.92 mg/dL (ref 0.44–1.00)
GFR, Estimated: 60 mL/min — ABNORMAL LOW (ref >60)
Glucose, Bld: 75 mg/dL (ref 70–99)
Potassium: 3.8 mmol/L (ref 3.5–5.1)
Sodium: 140 mmol/L (ref 135–145)
Total Bilirubin: 0.9 mg/dL (ref 0.3–1.2)
Total Protein: 6.1 g/dL — ABNORMAL LOW (ref 6.5–8.1)

## 2021-06-20 LAB — CBC WITH DIFFERENTIAL/PLATELET
Abs Immature Granulocytes: 0.05 10*3/uL (ref 0.00–0.07)
Basophils Absolute: 0.1 10*3/uL (ref 0.0–0.1)
Basophils Relative: 1 %
Eosinophils Absolute: 0.1 10*3/uL (ref 0.0–0.5)
Eosinophils Relative: 1 %
HCT: 42.4 % (ref 36.0–46.0)
Hemoglobin: 14.3 g/dL (ref 12.0–15.0)
Immature Granulocytes: 1 %
Lymphocytes Relative: 21 %
Lymphs Abs: 1.8 10*3/uL (ref 0.7–4.0)
MCH: 31.5 pg (ref 26.0–34.0)
MCHC: 33.7 g/dL (ref 30.0–36.0)
MCV: 93.4 fL (ref 80.0–100.0)
Monocytes Absolute: 0.8 10*3/uL (ref 0.1–1.0)
Monocytes Relative: 10 %
Neutro Abs: 5.7 10*3/uL (ref 1.7–7.7)
Neutrophils Relative %: 66 %
Platelets: 233 10*3/uL (ref 150–400)
RBC: 4.54 MIL/uL (ref 3.87–5.11)
RDW: 12.4 % (ref 11.5–15.5)
WBC: 8.5 10*3/uL (ref 4.0–10.5)
nRBC: 0 % (ref 0.0–0.2)

## 2021-06-20 LAB — URINALYSIS, ROUTINE W REFLEX MICROSCOPIC
Bilirubin Urine: NEGATIVE
Glucose, UA: NEGATIVE mg/dL
Hgb urine dipstick: NEGATIVE
Ketones, ur: NEGATIVE mg/dL
Leukocytes,Ua: NEGATIVE
Nitrite: NEGATIVE
Protein, ur: NEGATIVE mg/dL
Specific Gravity, Urine: 1.004 — ABNORMAL LOW (ref 1.005–1.030)
pH: 8 (ref 5.0–8.0)

## 2021-06-20 LAB — RESP PANEL BY RT-PCR (FLU A&B, COVID) ARPGX2
Influenza A by PCR: NEGATIVE
Influenza B by PCR: NEGATIVE
SARS Coronavirus 2 by RT PCR: NEGATIVE

## 2021-06-20 LAB — TSH: TSH: 0.264 u[IU]/mL — ABNORMAL LOW (ref 0.350–4.500)

## 2021-06-20 LAB — CBG MONITORING, ED: Glucose-Capillary: 99 mg/dL (ref 70–99)

## 2021-06-20 MED ORDER — ENOXAPARIN SODIUM 40 MG/0.4ML IJ SOSY
40.0000 mg | PREFILLED_SYRINGE | INTRAMUSCULAR | Status: DC
  Administered 2021-06-20 – 2021-06-24 (×5): 40 mg via SUBCUTANEOUS
  Filled 2021-06-20 (×6): qty 0.4

## 2021-06-20 MED ORDER — KCL IN DEXTROSE-NACL 20-5-0.9 MEQ/L-%-% IV SOLN: INTRAVENOUS | Status: DC

## 2021-06-20 MED ORDER — SODIUM CHLORIDE 0.9 % IV BOLUS
500.0000 mL | Freq: Once | INTRAVENOUS | Status: AC
  Administered 2021-06-20: 500 mL via INTRAVENOUS

## 2021-06-20 MED ORDER — ONDANSETRON HCL 4 MG/2ML IJ SOLN: 4.0000 mg | Freq: Four times a day (QID) | INTRAMUSCULAR | Status: DC | PRN

## 2021-06-20 MED ORDER — ACETAMINOPHEN 325 MG PO TABS
650.0000 mg | ORAL_TABLET | Freq: Four times a day (QID) | ORAL | Status: DC | PRN
  Administered 2021-06-22 – 2021-06-25 (×5): 650 mg via ORAL
  Filled 2021-06-20 (×5): qty 2

## 2021-06-20 MED ORDER — ASPIRIN 81 MG PO CHEW
81.0000 mg | CHEWABLE_TABLET | Freq: Every day | ORAL | Status: DC
  Administered 2021-06-21 – 2021-06-25 (×5): 81 mg via ORAL
  Filled 2021-06-20 (×5): qty 1

## 2021-06-20 MED ORDER — ACETAMINOPHEN 650 MG RE SUPP: 650.0000 mg | Freq: Four times a day (QID) | RECTAL | Status: DC | PRN

## 2021-06-20 MED ORDER — ONDANSETRON HCL 4 MG PO TABS: 4.0000 mg | ORAL_TABLET | Freq: Four times a day (QID) | ORAL | Status: DC | PRN

## 2021-06-20 NOTE — ED Notes (Signed)
Patient repositioned in bed and given 2 warm blankets at this time.

## 2021-06-20 NOTE — H&P (Signed)
History and Physical    Sarah Moyer [id number] DOB: [birth year] DOA: 06/20/2021  PCP: Asencion Noble, MD   Patient coming from: Home  I have personally briefly reviewed patient's old medical records in [place]  Chief Complaint: Generalized weakness  HPI: Sarah Moyer is a 87 y.o. female with medical history significant for dementia, breast cancer, hypertension. Patient was brought to the ED reports of generalized weakness that started 4 days ago on the 21st.  Daughter reports patient's donepezil was started 2 days prior, and she was taking it in the morning ( as opposed to afternoon/evening in the past).  No confusion was noted, no facial asymmetry, she is able to move all extremities, but she is too weak to get up or out bed.  Patient reported her head felt too heavy for her. Patient has chronic poor oral intake, over the past 2 days patient has not eaten or drank anything.  No vomiting no loose stools no cough or difficulty breathing no chest pain.  ED Course: Temperature 97.8.  Heart rate 52-68.  Respiratory rate 12-19.  Blood pressure systolic 0000000 to [id number].  Sats 100% on room air. WBC 8.5.  Chest x-ray without acute abnormality, shows left hemidiaphragm asymmetric elevation, similar to appearance in 2018.  UA not suggestive of infection.  Head CT without acute abnormality. 500 mill bolus given.  Hospitalist to admit.  Review of Systems: As per HPI all other systems reviewed and negative.  Past Medical History:  Diagnosis Date   Arrhythmia    left BBB,    PVC'S   Cancer ([place]) 10/2013   LEFT BREAST CANCER--LUMPECTOMY   DDD (degenerative disc disease)    Diverticular disease    With colonic polyps   Hiatal hernia    Hyperlipidemia    Hypertension     no meds    dr Radene Gunning   Osteopenia     Past Surgical History:  Procedure Laterality Date   APPENDECTOMY     BREAST LUMPECTOMY WITH NEEDLE LOCALIZATION Left 11/25/2013   Procedure: LEFT BREAST WIRE GUIDED LUMPECTOMY;   Surgeon: Rolm Bookbinder, MD;  Location: [place];  Service: General;  Laterality: Left;   BREAST SURGERY     CATARACT EXTRACTION     HIP ARTHROPLASTY  06/21/2012   Procedure: ARTHROPLASTY BIPOLAR HIP;  Surgeon: Mauri Pole, MD;  [facility];  Service: Orthopedics;  Laterality: Left;   HIP ARTHROPLASTY Right 02/03/2017   Procedure: ARTHROPLASTY BIPOLAR HIP (HEMIARTHROPLASTY) RIGHT;  Surgeon: Paralee Cancel, MD;  [facility];  Service: Orthopedics;  Laterality: Right;     reports that she has never smoked. She has never used smokeless tobacco. She reports that she does not drink alcohol and does not use drugs.  Allergies  Allergen Reactions   Other     Family History  Problem Relation Age of Onset   Heart Problems Mother        Died age 18, no details/Died age 53, no details   CAD Brother 53   CAD Brother 40    Prior to Admission medications   Medication Sig Start Date End Date Taking? Authorizing Provider  acetaminophen (TYLENOL) 325 MG tablet Take 325 mg by mouth every 6 (six) hours as needed.   Yes [provider]  Apoaequorin (PREVAGEN PO) Take 1 tablet by mouth daily.   Yes [provider]  aspirin 81 MG chewable tablet Chew 81 mg by mouth daily.   Yes [provider]  donepezil (ARICEPT  ODT) 10 MG disintegrating tablet Take 10 mg by mouth daily.   Yes [provider]  mirtazapine (REMERON) 15 MG tablet Take 15 mg by mouth at bedtime. 03/07/20  Yes [provider]  simvastatin (ZOCOR) 20 MG tablet Take 20 mg by mouth daily.    Yes [provider]    Physical Exam: Vitals:   06/20/21 1400 06/20/21 1500 06/20/21 1530 06/20/21 1600  BP: (!) 147/84 (!) 137/115 (!) 170/67 (!) 161/64  Pulse: 62 (!) 52 (!) 54 (!) 59  Resp: '16 12 15 19  '$ Temp:      TempSrc:      SpO2: 100% 100% 100% 98%  Weight:      Height:        Constitutional: Generalized weakness, Vitals:   06/20/21 1400 06/20/21 1500 06/20/21 1530 06/20/21  1600  BP: (!) 147/84 (!) 137/115 (!) 170/67 (!) 161/64  Pulse: 62 (!) 52 (!) 54 (!) 59  Resp: '16 12 15 19  '$ Temp:      TempSrc:      SpO2: 100% 100% 100% 98%  Weight:      Height:       Eyes: PERRL, lids and conjunctivae normal ENMT: Mucous membranes are dry..  Neck: normal, supple, no masses, no thyromegaly Respiratory: clear to auscultation bilaterally, no wheezing, no crackles. Normal respiratory effort. No accessory muscle use.  Cardiovascular: Regular rate and rhythm, no murmurs / rubs / gallops. No extremity edema. 2+ pedal pulses.   Abdomen: no tenderness, no masses palpated. No hepatosplenomegaly. Bowel sounds positive.  Musculoskeletal: no clubbing / cyanosis. No joint deformity upper and lower extremities. Good ROM, no contractures. Normal muscle tone.  Skin: no rashes, lesions, ulcers. No induration Neurologic: No facial asymmetry, speech is clear without evidence of aphasia, 4/5 strength in all extremities.  Sensation intact globally. Psychiatric: Normal judgment and insight. Alert and oriented x 3. Normal mood.  Appears generally weak.  Not confused.  Labs on Admission: I have personally reviewed following labs and imaging studies  CBC: Recent Labs  Lab 06/19/21 1816 06/20/21 1452  WBC 8.1 8.5  NEUTROABS  --  5.7  HGB 15.0 14.3  HCT 43.6 42.4  MCV 92.6 93.4  PLT 250 0000000   Basic Metabolic Panel: Recent Labs  Lab 06/19/21 1816 06/20/21 1452  NA 139 140  K 3.6 3.8  CL 108 108  CO2 23 27  GLUCOSE 98 75  BUN 12 11  CREATININE 0.82 0.92  CALCIUM 9.0 9.0   Liver Function Tests: Recent Labs  Lab 06/19/21 1816 06/20/21 1452  AST 15 14*  ALT 12 12  ALKPHOS 67 59  BILITOT 0.7 0.9  PROT 6.4* 6.1*  ALBUMIN 3.8 3.7    Urine analysis:    Component Value Date/Time   COLORURINE STRAW (A) 06/20/2021 1452   APPEARANCEUR CLEAR 06/20/2021 1452   LABSPEC 1.004 (L) 06/20/2021 1452   PHURINE 8.0 06/20/2021 1452   GLUCOSEU NEGATIVE 06/20/2021 1452   [place] 06/20/2021 1452   [place] 06/20/2021 1452   KETONESUR NEGATIVE 06/20/2021 1452   PROTEINUR NEGATIVE 06/20/2021 1452   UROBILINOGEN 1.0 06/20/2012 1802   NITRITE NEGATIVE 06/20/2021 1452   LEUKOCYTESUR NEGATIVE 06/20/2021 1452    Radiological Exams on Admission: DG Chest 2 View  Result Date: 06/20/2021 CLINICAL DATA:  Generalized weakness EXAM: CHEST - 2 VIEW COMPARISON:  Chest radiograph 02/05/2017 FINDINGS: The heart is mildly enlarged. The mediastinal contours are within normal limits. There is calcified atherosclerotic plaque of  the aortic arch. There are patchy opacities with areas of central lucency projecting over the left base, similar in appearance to the prior study. These structures project over the mid thorax on the lateral projection. The left upper lung is clear. The right lung is well aerated. There is no significant pleural effusion. There is no pneumothorax. There is degenerative change of both shoulders, right more than left. There is no acute osseous abnormality. IMPRESSION: 1. 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 The appearance is overall similar to 2018. CT may be considered for better evaluation 2. Mild cardiomegaly. 3. Otherwise, no radiographic evidence of acute cardiopulmonary process. Electronically Signed   By: Valetta Mole M.D.   On: 06/20/2021 15:55   CT HEAD WO CONTRAST (5MM)  Result Date: 06/20/2021 CLINICAL DATA:  Altered mental status EXAM: CT HEAD WITHOUT CONTRAST TECHNIQUE: Contiguous axial images were obtained from the base of the skull through the vertex without intravenous contrast. COMPARISON:  None. FINDINGS: Brain: There is no evidence of acute intracranial hemorrhage, extra-axial fluid collection, or infarct. There is mild global parenchymal volume loss. There is confluent hypodensity in the subcortical and periventricular  white matter likely reflecting sequela of chronic white matter microangiopathy. The ventricles are not Enlarged.  There is no midline shift.  No mass lesion is identified. Vascular: There is calcification of the bilateral cavernous ICAs. Skull: Normal. Negative for fracture or focal lesion. Sinuses/Orbits: The imaged paranasal sinuses are clear. Bilateral lens implants are in place. The globes and orbits are otherwise unremarkable. Other: There is advanced degenerative change of the temporomandibular joints bilaterally. IMPRESSION: 1. No acute intracranial pathology. 2. Global parenchymal volume loss and moderate chronic white matter microangiopathy. Electronically Signed   By: Valetta Mole M.D.   On: 06/20/2021 16:00    EKG: Independently reviewed.  Sinus rhythm rate 57, QTc 02/02/1979.  Old and unchanged LBBB.  PR interval prolonged at 250.  Assessment/Plan Principal Problem:   Failure to thrive in adult Active Problems:   Essential hypertension, benign   Generalized weakness   Generalized weakness/failure to thrive-chronic poor oral intake, with barely any oral intake in at least 2 days.  Suspicion for infectious etiology at this time.  Head CT without acute abnormality.  Chest x-ray and UA not suggestive of infection.  Blood sugar 75.  She appears dehydrated. -500 mill bolus given, continue D5 N/s + 20KCL @ 100cc/hr x 20 hrs -Encourage oral intake - Check TSH, B12, folate -May benefit from mirtazapine on discharge -Consider physical therapy evaluation prior to discharge  Dementia-mental status at baseline.  Head Ct showing global parenchymal volume loss, moderate chronic white matter microangiopathy. -Hold Aricept for now  Dyslipidemia -Hold simvastatin  DVT prophylaxis: Lovenox Code Status: Full code, daughter to confirm CODE STATUS with patient and spouse. Family Communication: Patient's daughter at bedside. Disposition Plan:  ~ 1- 2 days Consults called: None Admission status: Obs,  Med surg   Bethena Roys MD [facility]  06/20/2021, 6:33 PM

## 2021-06-20 NOTE — ED Provider Notes (Signed)
[facility] EMERGENCY DEPARTMENT Provider Note   CSN: [id number] Arrival date & time: 06/20/21  1302     History Chief Complaint  Patient presents with   Fatigue    Sarah Moyer is a 87 y.o. female.  HPI  87 year old female with a history of arrhythmia, cancer, diverticular disease, hiatal hernia, hyperlipidemia, hypertension, osteopenia, who presents to the emergency department today for evaluation of confusion.  Confusion has been present for the last several days.  The episode seem to wax and wane throughout the day.  This morning she woke up and was generally in her normal state of health and was able to shower pretty much independently however following this the afternoon she became extremely weak and was unable to even hold her head up.  She has been increasingly fatigued today.  She also is typically oriented to self place and time but was unable to even state her name when speaking with her PCP prior to arrival.  She is advised to come to the ED for further evaluation.  She denies any headaches, vision changes, unilateral weakness/numbness, chest pain, shortness of breath, abdominal pain, vomiting, diarrhea, urinary symptoms or cough.    Her daughter is at bedside and assists with the history.  She states that the patient was previously not on her benazepril however this was recently restarted at her PCP appointment on 06/14/2021  Past Medical History:  Diagnosis Date   Arrhythmia    left BBB,    PVC'S   Cancer ([place]) 10/2013   LEFT BREAST CANCER--LUMPECTOMY   DDD (degenerative disc disease)    Diverticular disease    With colonic polyps   Hiatal hernia    Hyperlipidemia    Hypertension     no meds    dr Radene Gunning   Osteopenia     Patient Active Problem List   Diagnosis Date Noted   Metabolic encephalopathy [id number]   Closed right hip fracture ([place]) 02/02/2017   Dementia ([place]) 02/02/2017   Left upper quadrant pain 12/10/2015   History of left hip replacement  06/12/2014   Osteoporosis 06/12/2014   Dizziness 02/15/2014   Ductal carcinoma in situ (DCIS) of left breast 12/23/2013   Breast cancer of upper-outer quadrant of left female breast ([place]) 11/09/2013   Hyperlipidemia 06/20/2012   Leukocytosis 06/20/2012   Fracture of femoral neck, left, closed ([place]) 06/20/2012   Essential hypertension, benign 09/12/2010   DYSPNEA 09/12/2010   ELECTROCARDIOGRAM, ABNORMAL 09/12/2010    Past Surgical History:  Procedure Laterality Date   APPENDECTOMY     BREAST LUMPECTOMY WITH NEEDLE LOCALIZATION Left 11/25/2013   Procedure: LEFT BREAST WIRE GUIDED LUMPECTOMY;  Surgeon: Rolm Bookbinder, MD;  Location: [facility] [facility];  Service: General;  Laterality: Left;   BREAST SURGERY     CATARACT EXTRACTION     HIP ARTHROPLASTY  06/21/2012   Procedure: ARTHROPLASTY BIPOLAR HIP;  Surgeon: Mauri Pole, MD;  Location: [facility];  Service: Orthopedics;  Laterality: Left;   HIP ARTHROPLASTY Right 02/03/2017   Procedure: ARTHROPLASTY BIPOLAR HIP (HEMIARTHROPLASTY) RIGHT;  Surgeon: Paralee Cancel, MD;  Location: [facility];  Service: Orthopedics;  Laterality: Right;     OB History   No obstetric history on file.     Family History  Problem Relation Age of Onset   Heart Problems Mother        Died age 35, no details/Died age 73, no details   CAD Brother 54   CAD Brother 8    Social History  Tobacco Use   Smoking status: Never   Smokeless tobacco: Never  Vaping Use   Vaping Use: Never used  Substance Use Topics   Alcohol use: No   Drug use: No    Home Medications Prior to Admission medications   Medication Sig Start Date End Date Taking? Authorizing Provider  acetaminophen (TYLENOL) 325 MG tablet Take 325 mg by mouth every 6 (six) hours as needed.   Yes [provider]  Apoaequorin (PREVAGEN PO) Take 1 tablet by mouth daily.   Yes [provider]  aspirin 81 MG chewable tablet Chew 81 mg by mouth daily.   Yes [provider]  donepezil  (ARICEPT ODT) 10 MG disintegrating tablet Take 10 mg by mouth daily.   Yes [provider]  mirtazapine (REMERON) 15 MG tablet Take 15 mg by mouth at bedtime. 03/07/20  Yes [provider]  simvastatin (ZOCOR) 20 MG tablet Take 20 mg by mouth daily.    Yes [provider]    Allergies    Other  Review of Systems   Review of Systems  Constitutional:  Positive for fatigue. Negative for chills and fever.  HENT:  Negative for ear pain and sore throat.   Eyes:  Negative for visual disturbance.  Respiratory:  Negative for cough and shortness of breath.   Cardiovascular:  Negative for chest pain.  Gastrointestinal:  Negative for abdominal pain, constipation, diarrhea, nausea and vomiting.  Genitourinary:  Negative for dysuria and hematuria.  Musculoskeletal:  Negative for back pain.  Skin:  Negative for rash.  Neurological:  Positive for weakness. Negative for dizziness, light-headedness and headaches.  All other systems reviewed and are negative.  Physical Exam Updated Vital Signs BP (!) 161/64   Pulse (!) 59   Temp 97.8 F (36.6 C) (Oral)   Resp 19   Ht '5\' 4"'$  (1.626 m)   Wt 53.1 kg   SpO2 98%   BMI 20.08 kg/m   Physical Exam Vitals and nursing note reviewed.  Constitutional:      General: She is not in acute distress.    Appearance: She is well-developed.  HENT:     Head: Normocephalic and atraumatic.  Eyes:     Conjunctiva/sclera: Conjunctivae normal.  Cardiovascular:     Rate and Rhythm: Normal rate and regular rhythm.     Heart sounds: Normal heart sounds. No murmur heard. Pulmonary:     Effort: Pulmonary effort is normal. No respiratory distress.     Breath sounds: Normal breath sounds. No wheezing, rhonchi or rales.  Abdominal:     General: Bowel sounds are normal.     Palpations: Abdomen is soft.     Tenderness: There is no abdominal tenderness. There is no guarding or rebound.  Musculoskeletal:     Cervical back: Neck supple.   Skin:    General: Skin is warm and dry.  Neurological:     Mental Status: She is alert.     Comments: Mental Status:  Alert, thought content appropriate, able to give a coherent history. Speech fluent without evidence of aphasia. Able to follow 2 step commands without difficulty.  Cranial Nerves:  II:  pupils equal, round, reactive to light III,IV, VI: ptosis not present, extra-ocular motions intact bilaterally  V,VII: smile symmetric, facial light touch sensation equal VIII: hearing grossly normal to voice  X: uvula elevates symmetrically  XI: bilateral shoulder shrug symmetric and strong XII: midline tongue extension without fassiculations Motor:  Normal tone. Globally weak but symmetric  strength of BUE and BLE major muscle groups including strong and equal grip strength and dorsiflexion/plantar flexion Sensory: light touch normal in all extremities.     ED Results / Procedures / Treatments   Labs (all labs ordered are listed, but only abnormal results are displayed) Labs Reviewed  COMPREHENSIVE METABOLIC PANEL - Abnormal; Notable for the following components:      Result Value   Total Protein 6.1 (*)    AST 14 (*)    GFR, Estimated 60 (*)    All other components within normal limits  URINALYSIS, ROUTINE W REFLEX MICROSCOPIC - Abnormal; Notable for the following components:   Color, Urine STRAW (*)    Specific Gravity, Urine 1.004 (*)    All other components within normal limits  RESP PANEL BY RT-PCR (FLU A&B, COVID) [id number]  CBC WITH DIFFERENTIAL/PLATELET    EKG EKG Interpretation  Date/Time:  Thursday June 20 2021 14:46:41 EDT Ventricular Rate:  57 PR Interval:  250 QRS Duration: 132 QT Interval:  501 QTC Calculation: 488 R Axis:   8 Text Interpretation: Sinus rhythm Prolonged PR interval Left bundle branch block Baseline wander in lead(s) II III aVF No significant change since last tracing Confirmed by Calvert Cantor [phone]) on 06/20/2021 4:56:44  PM  Radiology DG Chest 2 View  Result Date: 06/20/2021 CLINICAL DATA:  Generalized weakness EXAM: CHEST - 2 VIEW COMPARISON:  Chest radiograph 02/05/2017 FINDINGS: The heart is mildly enlarged. The mediastinal contours are within normal limits. There is calcified atherosclerotic plaque of the aortic arch. There are patchy opacities with areas of central lucency projecting over the left base, similar in appearance to the prior study. These structures project over the mid thorax on the lateral projection. The left upper lung is clear. The right lung is well aerated. There is no significant pleural effusion. There is no pneumothorax. There is degenerative change of both shoulders, right more than left. There is no acute osseous abnormality. IMPRESSION: 1. 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 The appearance is overall similar to 2018. CT may be considered for better evaluation 2. Mild cardiomegaly. 3. Otherwise, no radiographic evidence of acute cardiopulmonary process. Electronically Signed   By: Valetta Mole M.D.   On: 06/20/2021 15:55   CT HEAD WO CONTRAST (5MM)  Result Date: 06/20/2021 CLINICAL DATA:  Altered mental status EXAM: CT HEAD WITHOUT CONTRAST TECHNIQUE: Contiguous axial images were obtained from the base of the skull through the vertex without intravenous contrast. COMPARISON:  None. FINDINGS: Brain: There is no evidence of acute intracranial hemorrhage, extra-axial fluid collection, or infarct. There is mild global parenchymal volume loss. There is confluent hypodensity in the subcortical and periventricular white matter likely reflecting sequela of chronic white matter microangiopathy. The ventricles are not Enlarged.  There is no midline shift.  No mass lesion is identified. Vascular: There is calcification of the bilateral cavernous ICAs. Skull: Normal. Negative for fracture  or focal lesion. Sinuses/Orbits: The imaged paranasal sinuses are clear. Bilateral lens implants are in place. The globes and orbits are otherwise unremarkable. Other: There is advanced degenerative change of the temporomandibular joints bilaterally. IMPRESSION: 1. No acute intracranial pathology. 2. Global parenchymal volume loss and moderate chronic white matter microangiopathy. Electronically Signed   By: Valetta Mole M.D.   On: 06/20/2021 16:00    Procedures Procedures   Medications Ordered in ED Medications  sodium chloride 0.9 % bolus 500 mL (0  mLs Intravenous Stopped 06/20/21 1548)    ED Course  I have reviewed the triage vital signs and the nursing notes.  Pertinent labs & imaging results that were available during my care of the patient were reviewed by me and considered in my medical decision making (see chart for details).    MDM Rules/Calculators/A&P                          87 year old female presenting to the emergency department today for evaluation of increased fatigue and altered mental status that has been ongoing intermittently for the last several days.  She is currently not back to baseline though does not have any focal neurodeficits on exam.  We will get laboratory work and a CT scan of the head and chest x-ray to rule out an occult pneumonia.  Reviewed/interpreted lab CBC is without anemia or leukocytosis CMP does not show any electrolyte derangement.  Patient has normal kidney and liver function UA does not show any evidence for infection COVID test pending at the time of admission  EKG shows no acute ischemic changes  Reviewed/interpreted imaging CT head without acute intracranial findings Chest x-ray without evidence for infectious process or other acute cardiopulmonary abnormality  Patient is monitored in the emergency department for several hours and is still not returned to baseline.  This is her second visit in the last 48 hours for similar concerns.   Feel she will require further admission for further investigation of her altered mental status though at this time it is felt to be likely due to to polypharmacy. she may need further work-up during her admission if ams persists.  4:49 PM CONSULT with Dr. Denton Brick who accepts patient for admission   Final Clinical Impression(s) / ED Diagnoses Final diagnoses:  Altered mental status, unspecified altered mental status type    Rx / DC Orders ED Discharge Orders     None        Bishop Dublin 06/20/21 1657    Truddie Hidden, MD 06/20/21 [phone]

## 2021-06-20 NOTE — ED Notes (Signed)
Unable to get oral temperature on patient.  Patient unable to hold thermometer under tongue

## 2021-06-20 NOTE — ED Triage Notes (Addendum)
Pt got up this morning and took bath normally per family, then pt began to have generalized weakness; spoke with Dr. Willey Blade and instructed to to come here. Pt was seen here for same yesterday. Pt with jerky movements while in triage.  Pt denies any pain. Family member holding pt up in wheelchair til pt was transferred to stretcher.

## 2021-06-21 DIAGNOSIS — R627 Adult failure to thrive: Secondary | ICD-10-CM | POA: Diagnosis not present

## 2021-06-21 LAB — BASIC METABOLIC PANEL
Anion gap: 5 (ref 5–15)
BUN: 11 mg/dL (ref 8–23)
CO2: 26 mmol/L (ref 22–32)
Calcium: 8.7 mg/dL — ABNORMAL LOW (ref 8.9–10.3)
Chloride: 110 mmol/L (ref 98–111)
Creatinine, Ser: 0.76 mg/dL (ref 0.44–1.00)
GFR, Estimated: >60 mL/min (ref >60)
Glucose, Bld: 90 mg/dL (ref 70–99)
Potassium: 4.4 mmol/L (ref 3.5–5.1)
Sodium: 141 mmol/L (ref 135–145)

## 2021-06-21 LAB — T4, FREE: Free T4: 1.03 ng/dL (ref 0.61–1.12)

## 2021-06-21 LAB — AMMONIA: Ammonia: 11 umol/L (ref 9–35)

## 2021-06-21 LAB — FOLATE: Folate: 8.3 ng/mL (ref >5.9)

## 2021-06-21 LAB — VITAMIN B12: Vitamin B-12: 129 pg/mL — ABNORMAL LOW (ref 180–914)

## 2021-06-21 MED ORDER — CYANOCOBALAMIN 1000 MCG/ML IJ SOLN
1000.0000 ug | Freq: Every day | INTRAMUSCULAR | Status: DC
  Administered 2021-06-21: 1000 ug via INTRAMUSCULAR
  Filled 2021-06-21 (×2): qty 1

## 2021-06-21 MED ORDER — KCL IN DEXTROSE-NACL 20-5-0.9 MEQ/L-%-% IV SOLN: INTRAVENOUS | Status: DC

## 2021-06-21 MED ORDER — ENSURE ENLIVE PO LIQD
237.0000 mL | Freq: Two times a day (BID) | ORAL | Status: DC
  Administered 2021-06-21 – 2021-06-23 (×5): 237 mL via ORAL

## 2021-06-21 MED ORDER — MEGESTROL ACETATE 40 MG PO TABS
40.0000 mg | ORAL_TABLET | Freq: Every day | ORAL | Status: DC
  Administered 2021-06-21 – 2021-06-22 (×2): 40 mg via ORAL
  Filled 2021-06-21 (×5): qty 1

## 2021-06-21 NOTE — Evaluation (Signed)
Occupational Therapy Evaluation Patient Details Name: Sarah Moyer MRN: [id number] DOB: [birth year] Today's Date: 06/21/2021    History of Present Illness Sarah Moyer is a 87 y.o. female with medical history significant for dementia, breast cancer, hypertension.  Patient was brought to the ED reports of generalized weakness that started 4 days ago on the 21st.  Daughter reports patient's donepezil was started 2 days prior, and she was taking it in the morning ( as opposed to afternoon/evening in the past).  No confusion was noted, no facial asymmetry, she is able to move all extremities, but she is too weak to get up or out bed.  Patient reported her head felt too heavy for her.  Patient has chronic poor oral intake, over the past 2 days patient has not eaten or drank anything.  No vomiting no loose stools no cough or difficulty breathing no chest pain.   Clinical Impression   Pt supine in bed upon therapist arrival. Pt required min to mod A for supine to sit bed mobility with fair to good seated balance at EOB. Pt requires min to mod A for sit to stand using RW and more moderate assist for functional ambulation and transfers due to poor balance and impulsivity with lack of safety with RW needing additional assist. Pt demonstrates general B UE weakness and difficulty lifting head against gravity at times. Pt appears to be limited by general weakness leading to poor motor control and coordination. Pt will benefit from continued OT in the hospital and recommended venue below to increase strength, balance, and endurance for safe ADL's.     Follow Up Recommendations  SNF    Equipment Recommendations  None recommended by OT           Precautions / Restrictions Precautions Precautions: Fall Restrictions Weight Bearing Restrictions: No      Mobility Bed Mobility Overal bed mobility: Needs Assistance Bed Mobility: Supine to Sit     Supine to sit: Min assist;Mod assist      General bed mobility comments: slow labored movement    Transfers Overall transfer level: Needs assistance Equipment used: Rolling walker (2 wheeled) Transfers: Sit to/from [facility] Sit to Stand: Min assist;Mod assist Stand pivot transfers: Mod assist       General transfer comment: Pt is impulsive lacking safety with RW and needing additional assist. Attempted functional ambultaion and sit to stand without RW but pt demonstrated poor balance.    Balance Overall balance assessment: Needs assistance Sitting-balance support: No upper extremity supported;Feet supported Sitting balance-Leahy Scale: Fair Sitting balance - Comments: fair to good seated at EOB   Standing balance support: Bilateral upper extremity supported;During functional activity Standing balance-Leahy Scale: Poor Standing balance comment: using RW                           ADL either performed or assessed with clinical judgement   ADL Overall ADL's : Needs assistance/impaired                         Toilet Transfer: Moderate assistance;Stand-pivot;RW Toilet Transfer Details (indicate cue type and reason): Simulated via EOB to chair transfer with Mod A due to poor balance and  assist needed to manage RW.                 Vision Baseline Vision/History: 1 Wears glasses (reading glasses rarely) Ability to See in Adequate Light: 0  Adequate                  Pertinent Vitals/Pain Pain Assessment: No/denies pain     Hand Dominance Right   Extremity/Trunk Assessment Upper Extremity Assessment Upper Extremity Assessment: Generalized weakness   Lower Extremity Assessment Lower Extremity Assessment: Defer to PT evaluation   Cervical / Trunk Assessment Cervical / Trunk Assessment: Kyphotic   Communication Communication Communication: HOH   Cognition Arousal/Alertness: Awake/alert Behavior During Therapy: Impulsive Overall Cognitive Status: History of  cognitive impairments - at baseline                                 General Comments: Pt impulsive during functional ambulation.                    Home Living Family/patient expects to be discharged to:: Private residence Living Arrangements: Spouse/significant other Available Help at Discharge: Family;Available 24 hours/day Type of Home: House Home Access: Stairs to enter [employer] of Steps: 1 Entrance Stairs-Rails: None Home Layout: Laundry or work area in basement;Two level;Able to live on main level with bedroom/bathroom Alternate Level Stairs-Number of Steps: 15 steps to basement Alternate Level Stairs-Rails: Right Bathroom Shower/Tub: Occupational psychologist: Handicapped height Bathroom Accessibility: Yes   Home Equipment: Environmental consultant - 2 wheels;Walker - 4 wheels;Cane - quad;Shower seat;Grab bars - tub/shower          Prior Functioning/Environment Level of Independence: Needs assistance  Gait / Transfers Assistance Needed: Household ambulator without AD. Pt also reportedly walked to mailbox and outside at times without AD. ADL's / Homemaking Assistance Needed: Independent for ADL's with assist for IADL's from family.            OT Problem List: Decreased strength;Decreased range of motion;Decreased activity tolerance;Impaired balance (sitting and/or standing);Decreased safety awareness;Decreased knowledge of use of DME or AE;Impaired UE functional use      OT Treatment/Interventions: Self-care/ADL training;Therapeutic exercise;DME and/or AE instruction;Therapeutic activities;Cognitive remediation/compensation;Patient/family education;Balance training    OT Goals(Current goals can be found in the care plan section) Acute Rehab OT Goals Patient Stated Goal: return home OT Goal Formulation: With patient/family Time For Goal Achievement: 07/05/21 Potential to Achieve Goals: Fair  OT Frequency: Min 2X/week                Co-evaluation PT/OT/SLP Co-Evaluation/Treatment: Yes Reason for Co-Treatment: Complexity of the patient's impairments (multi-system involvement);To address functional/ADL transfers PT goals addressed during session: Mobility/safety with mobility;Proper use of DME;Balance OT goals addressed during session: ADL's and self-care;Strengthening/ROM      AM-PAC OT "6 Clicks" Daily Activity     Outcome Measure Help from another person eating meals?: A Little Help from another person taking care of personal grooming?: A Lot Help from another person toileting, which includes using toliet, bedpan, or urinal?: A Lot Help from another person bathing (including washing, rinsing, drying)?: A Lot Help from another person to put on and taking off regular upper body clothing?: A Little Help from another person to put on and taking off regular lower body clothing?: A Lot 6 Click Score: 14   End of Session Equipment Utilized During Treatment: Rolling walker  Activity Tolerance: Patient tolerated treatment well Patient left: in chair;with chair alarm set;with family/visitor present;with call bell/phone within reach  OT Visit Diagnosis: Unsteadiness on feet (R26.81);Muscle weakness (generalized) (M62.81);Adult, failure to thrive (R62.7)  Time: 1030-1053 OT Time Calculation (min): 23 min Charges:  OT General Charges $OT Visit: 1 Visit OT Evaluation $OT Eval Low Complexity: [place] OT, MOT  Larey Seat 06/21/2021, 12:28 PM

## 2021-06-21 NOTE — Progress Notes (Signed)
PROGRESS NOTE    Sarah Moyer  [id number] DOB: 03-21-1930 DOA: 06/20/2021 PCP: Asencion Noble, MD   Brief Narrative: 87 year old with past medical history significant for dementia, breast cancer, hypertension patient presented to the ED with generalized weakness that is started 4 days prior to admission.  Patient daughters report patient was recently restarted on donepezil.  Patient has poor oral intake over the past 2 days.    Assessment & Plan:   Principal Problem:   Failure to thrive in adult Active Problems:   Essential hypertension, benign   Generalized weakness   1-Generalized weakness/failure to thrive: Poor oral intake. Evidence of infection, CT head no acute abnormality, chest x-ray and UA negative for infection. B12 low, started B12 intramuscular supplement. Started Megace for appetite stimulant TSH low, plan to check Free T 3 and Free T4.  Check Vitamin D.  Ammonia normal.  RPR pending.   2-Dementia: Mental status at baseline. Hold Aricept.  HLD;  Hold simvastatin.  B 12 Deficiency; B12 IM  ordered.   Estimated body mass index is 20.08 kg/m as calculated from the following:   Height as of this encounter: '5\' 4"'$  (1.626 m).   Weight as of this encounter: 53.1 kg.   DVT prophylaxis: Lovenox Code Status: Full Code Family Communication: husband and son at bedside.  Disposition Plan:  Status is: Observation  The patient will require care spanning > 2 midnights and should be moved to inpatient because: IV treatments appropriate due to intensity of illness or inability to take PO  Dispo: The patient is from: Home              Anticipated d/c is to:  to be determine              Patient currently is not medically stable to d/c.   Difficult to place patient No        Consultants:  None  Procedures:  None  Antimicrobials:    Subjective: She is alert, report no appetite. Denies pain  Objective: Vitals:   06/20/21 1900 06/20/21 1959  06/21/21 0030 06/21/21 0432  BP: (!) 141/74 (!) 163/118 135/75 (!) 151/76  Pulse: (!) 51 (!) 56 (!) 55 (!) 58  Resp: '13 18 18 18  '$ Temp:  98.2 F (36.8 C) 98.1 F (36.7 C) 98 F (36.7 C)  TempSrc:   Oral   SpO2: 98% 100% 99% 100%  Weight:      Height:        Intake/Output Summary (Last 24 hours) at 06/21/2021 0817 Last data filed at 06/21/2021 0647 Gross per 24 hour  Intake 1754.32 ml  Output 400 ml  Net 1354.32 ml   Filed Weights   06/20/21 1309  Weight: 53.1 kg    Examination:  General exam: Appears calm and comfortable , thin appearing Respiratory system: Clear to auscultation. Respiratory effort normal. Cardiovascular system: S1 & S2 heard, RRR. Gastrointestinal system: Abdomen is nondistended, soft and nontender.  Central nervous system: Alert and oriented. No focal neurological deficits. Extremities: Symmetric 5 x 5 power.   Data Reviewed: I have personally reviewed following labs and imaging studies  CBC: Recent Labs  Lab 06/19/21 1816 06/20/21 1452  WBC 8.1 8.5  NEUTROABS  --  5.7  HGB 15.0 14.3  HCT 43.6 42.4  MCV 92.6 93.4  PLT 250 0000000   Basic Metabolic Panel: Recent Labs  Lab 06/19/21 1816 06/20/21 1452  NA 139 140  K 3.6 3.8  CL 108 108  CO2  23 27  GLUCOSE 98 75  BUN 12 11  CREATININE 0.82 0.92  CALCIUM 9.0 9.0   GFR: Estimated Creatinine Clearance: 35.4 mL/min (by C-G formula based on SCr of 0.92 mg/dL). Liver Function Tests: Recent Labs  Lab 06/19/21 1816 06/20/21 1452  AST 15 14*  ALT 12 12  ALKPHOS 67 59  BILITOT 0.7 0.9  PROT 6.4* 6.1*  ALBUMIN 3.8 3.7   No results for input(s): LIPASE, AMYLASE in the last 168 hours. No results for input(s): AMMONIA in the last 168 hours. Coagulation Profile: No results for input(s): INR, PROTIME in the last 168 hours. Cardiac Enzymes: No results for input(s): CKTOTAL, CKMB, CKMBINDEX, TROPONINI in the last 168 hours. BNP (last 3 results) No results for input(s): PROBNP in the last  8760 hours. HbA1C: No results for input(s): HGBA1C in the last 72 hours. CBG: Recent Labs  Lab 06/20/21 1825  GLUCAP 99   Lipid Profile: No results for input(s): CHOL, HDL, LDLCALC, TRIG, CHOLHDL, LDLDIRECT in the last 72 hours. Thyroid Function Tests: Recent Labs    06/20/21 1453  TSH 0.264*   Anemia Panel: Recent Labs    06/21/21 0557  VITAMINB12 129*  FOLATE 8.3   Sepsis Labs: No results for input(s): PROCALCITON, LATICACIDVEN in the last 168 hours.  Recent Results (from the past 240 hour(s))  Resp Panel by RT-PCR (Flu A&B, Covid) Nasopharyngeal Swab     Status: None   Collection Time: 06/20/21  4:45 PM   Specimen: Nasopharyngeal Swab; Nasopharyngeal(NP) swabs in vial transport medium  Result Value Ref Range Status   SARS Coronavirus 2 by RT PCR NEGATIVE NEGATIVE Final    Comment: (NOTE) SARS-CoV-2 target nucleic acids are NOT DETECTED.  The SARS-CoV-2 RNA is generally detectable in upper respiratory specimens during the acute phase of infection. The lowest concentration of SARS-CoV-2 viral copies this assay can detect is 138 copies/mL. A negative result does not preclude SARS-Cov-2 infection and should not be used as the sole basis for treatment or other patient management decisions. A negative result may occur with  improper specimen collection/handling, submission of specimen other than nasopharyngeal swab, presence of viral mutation(s) within the areas targeted by this assay, and inadequate number of viral copies(<138 copies/mL). A negative result must be combined with clinical observations, patient history, and epidemiological information. The expected result is Negative.  Fact Sheet for Patients:  EntrepreneurPulse.com.au  Fact Sheet for Healthcare Providers:  IncredibleEmployment.be  This test is no t yet approved or cleared by the [place] FDA and  has been authorized for detection and/or diagnosis of  SARS-CoV-2 by FDA under an Emergency Use Authorization (EUA). This EUA will remain  in effect (meaning this test can be used) for the duration of the COVID-19 declaration under Section 564(b)(1) of the Act, 21 U.S.C.section 360bbb-3(b)(1), unless the authorization is terminated  or revoked sooner.       Influenza A by PCR NEGATIVE NEGATIVE Final   Influenza B by PCR NEGATIVE NEGATIVE Final    Comment: (NOTE) The Xpert Xpress SARS-CoV-2/FLU/RSV plus assay is intended as an aid in the diagnosis of influenza from Nasopharyngeal swab specimens and should not be used as a sole basis for treatment. Nasal washings and aspirates are unacceptable for Xpert Xpress SARS-CoV-2/FLU/RSV testing.  Fact Sheet for Patients: EntrepreneurPulse.com.au  Fact Sheet for Healthcare Providers: IncredibleEmployment.be  This test is not yet approved or cleared by the [place] FDA and has been authorized for detection and/or diagnosis of SARS-CoV-2 by FDA under an  Emergency Use Authorization (EUA). This EUA will remain in effect (meaning this test can be used) for the duration of the COVID-19 declaration under Section 564(b)(1) of the Act, 21 U.S.C. section 360bbb-3(b)(1), unless the authorization is terminated or revoked.  Performed at [facility], [address], [place], [place] [address]          Radiology Studies: DG Chest 2 View  Result Date: 06/20/2021 CLINICAL DATA:  Generalized weakness EXAM: CHEST - 2 VIEW COMPARISON:  Chest radiograph 02/05/2017 FINDINGS: The heart is mildly enlarged. The mediastinal contours are within normal limits. There is calcified atherosclerotic plaque of the aortic arch. There are patchy opacities with areas of central lucency projecting over the left base, similar in appearance to the prior study. These structures project over the mid thorax on the lateral projection. The left upper lung is clear. The right lung is well  aerated. There is no significant pleural effusion. There is no pneumothorax. There is degenerative change of both shoulders, right more than left. There is no acute osseous abnormality. IMPRESSION: 1. 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 The appearance is overall similar to 2018. CT may be considered for better evaluation 2. Mild cardiomegaly. 3. Otherwise, no radiographic evidence of acute cardiopulmonary process. Electronically Signed   By: Valetta Mole M.D.   On: 06/20/2021 15:55   CT HEAD WO CONTRAST (5MM)  Result Date: 06/20/2021 CLINICAL DATA:  Altered mental status EXAM: CT HEAD WITHOUT CONTRAST TECHNIQUE: Contiguous axial images were obtained from the base of the skull through the vertex without intravenous contrast. COMPARISON:  None. FINDINGS: Brain: There is no evidence of acute intracranial hemorrhage, extra-axial fluid collection, or infarct. There is mild global parenchymal volume loss. There is confluent hypodensity in the subcortical and periventricular white matter likely reflecting sequela of chronic white matter microangiopathy. The ventricles are not Enlarged.  There is no midline shift.  No mass lesion is identified. Vascular: There is calcification of the bilateral cavernous ICAs. Skull: Normal. Negative for fracture or focal lesion. Sinuses/Orbits: The imaged paranasal sinuses are clear. Bilateral lens implants are in place. The globes and orbits are otherwise unremarkable. Other: There is advanced degenerative change of the temporomandibular joints bilaterally. IMPRESSION: 1. No acute intracranial pathology. 2. Global parenchymal volume loss and moderate chronic white matter microangiopathy. Electronically Signed   By: Valetta Mole M.D.   On: 06/20/2021 16:00        Scheduled Meds:  aspirin  81 mg Oral Daily   cyanocobalamin  1,000 mcg Intramuscular Daily    enoxaparin (LOVENOX) injection  40 mg Subcutaneous Q24H   Continuous Infusions:  dextrose 5 % and 0.9 % NaCl with KCl 20 mEq/L 100 mL/hr at 06/21/21 0643     LOS: 0 days    Time spent: 80 Minutes    Paislea Hatton A Ruqaya Strauss, MD [facility]   If 7PM-7AM, please contact night-coverage [email or web address]  06/21/2021, 8:17 AM

## 2021-06-21 NOTE — TOC Progression Note (Signed)
Transition of Care [facility]) - Progression Note    Patient Details  Name: Sarah Moyer MRN: [id number] Date of Birth: 1930-04-15  [facility]) CM/SW Contact  Joaquin Courts, RN Phone Number: 06/21/2021, 2:37 PM  Clinical Narrative:    CM met with patient family at bedside and presented bed offers.  CM also reached out to [facility], unfortunately facility is unable to offer bed at this time.  This information was communicated to the family.   Patient and family request time to review offers prior to making final decision.  TOC will continue to follow for choice.    Expected Discharge Plan: [place] Barriers to Discharge: Continued Medical Work up  Expected Discharge Plan and Services Expected Discharge Plan: [place]   Discharge Planning Services: CM Consult Post Acute Care Choice: [place] Living arrangements for the past 2 months: Single Family Home                                       Social Determinants of Health (SDOH) Interventions    Readmission Risk Interventions No flowsheet data found.

## 2021-06-21 NOTE — Plan of Care (Signed)
  Problem: Acute Rehab OT Goals (only OT should resolve) Goal: Pt. Will Perform Eating Flowsheets (Taken 06/21/2021 1229) Pt Will Perform Eating:  with supervision  with set-up  sitting  bed level Goal: Pt. Will Perform Grooming Flowsheets (Taken 06/21/2021 1229) Pt Will Perform Grooming:  standing  with min guard assist  with adaptive equipment Goal: Pt. Will Perform Upper Body Dressing Flowsheets (Taken 06/21/2021 1229) Pt Will Perform Upper Body Dressing:  with set-up  with supervision  sitting  with adaptive equipment Goal: Pt. Will Perform Lower Body Dressing Flowsheets (Taken 06/21/2021 1229) Pt Will Perform Lower Body Dressing:  with min assist  sitting/lateral leans  sit to/from stand  with adaptive equipment Goal: Pt. Will Transfer To Toilet Flowsheets (Taken 06/21/2021 1229) Pt Will Transfer to Toilet:  with min guard assist  with supervision  stand pivot transfer Goal: Pt/Caregiver Will Perform Home Exercise Program Flowsheets (Taken 06/21/2021 1229) Pt/caregiver will Perform Home Exercise Program:  Increased strength  Both right and left upper extremity  With minimal assist  With Supervision  Nai Borromeo OT, MOT

## 2021-06-21 NOTE — NC FL2 (Signed)
[place] LEVEL OF CARE SCREENING TOOL     IDENTIFICATION  Patient Name: Sarah Moyer Birthdate: 1930/06/15 Sex: female Admission Date (Current Location): 06/20/2021  Csf - [place] and [place] Number:  [employer] and Address:  [place] [address], [place]      Provider Number: [id number]  Attending Physician Name and Address:  Elmarie Shiley, MD  Relative Name and Phone Number:       Current Level of Care: Hospital Recommended Level of Care: [place] Prior Approval Number:    Date Approved/Denied:   PASRR Number: [id number]  Discharge Plan: SNF    Current Diagnoses: Patient Active Problem List   Diagnosis Date Noted   Generalized weakness 06/20/2021   Failure to thrive in adult 06/20/2021   Closed right hip fracture ([place]) 02/02/2017   Dementia ([place]) 02/02/2017   Left upper quadrant pain 12/10/2015   History of left hip replacement 06/12/2014   Osteoporosis 06/12/2014   Dizziness 02/15/2014   Ductal carcinoma in situ (DCIS) of left breast 12/23/2013   Breast cancer of upper-outer quadrant of left female breast ([place]) 11/09/2013   Hyperlipidemia 06/20/2012   Leukocytosis 06/20/2012   Fracture of femoral neck, left, closed ([place]) 06/20/2012   Essential hypertension, benign 09/12/2010   DYSPNEA 09/12/2010   ELECTROCARDIOGRAM, ABNORMAL 09/12/2010    Orientation RESPIRATION BLADDER Height & Weight     Self, Time, Situation, Place  Normal External catheter Weight: 53.1 kg Height:  '5\' 4"'$  (162.6 cm)  BEHAVIORAL SYMPTOMS/MOOD NEUROLOGICAL BOWEL NUTRITION STATUS      Continent Diet (Regular. See d/c summary for updates.)  AMBULATORY STATUS COMMUNICATION OF NEEDS Skin   Extensive Assist Verbally Normal                       Personal Care Assistance Level of Assistance  Feeding, Bathing, Dressing Bathing Assistance: Maximum assistance Feeding assistance: Limited assistance Dressing  Assistance: Maximum assistance     Functional Limitations Info  Hearing, Sight, Speech Sight Info: Adequate Hearing Info: Impaired Speech Info: Adequate    SPECIAL CARE FACTORS FREQUENCY  PT (By licensed PT)     PT Frequency: 5x weekly              Contractures      Additional Factors Info  Code Status, Allergies, Psychotropic Code Status Info: Full code Allergies Info: other: not specified Psychotropic Info: Remeron         Current Medications (06/21/2021):  This is the current hospital active medication list Current Facility-Administered Medications  Medication Dose Route Frequency Provider Last Rate Last Admin   acetaminophen (TYLENOL) tablet 650 mg  650 mg Oral Q6H PRN Emokpae, Ejiroghene E, MD       Or   acetaminophen (TYLENOL) suppository 650 mg  650 mg Rectal Q6H PRN Emokpae, Ejiroghene E, MD       aspirin chewable tablet 81 mg  81 mg Oral Daily Emokpae, Ejiroghene E, MD   81 mg at 06/21/21 0749   cyanocobalamin ((VITAMIN B-12)) injection 1,000 mcg  1,000 mcg Intramuscular Daily Regalado, Belkys A, MD   1,000 mcg at 06/21/21 0848   dextrose 5 % and 0.9 % NaCl with KCl 20 mEq/L infusion   Intravenous Continuous Regalado, Belkys A, MD 100 mL/hr at 06/21/21 0946 Restarted at 06/21/21 0946   enoxaparin (LOVENOX) injection 40 mg  40 mg Subcutaneous Q24H Emokpae, Ejiroghene E, MD   40 mg at 06/20/21 2034  feeding supplement (ENSURE ENLIVE / ENSURE PLUS) liquid 237 mL  237 mL Oral BID BM Regalado, Belkys A, MD   237 mL at 06/21/21 1029   megestrol (MEGACE) tablet 40 mg  40 mg Oral Daily Regalado, Belkys A, MD       ondansetron (ZOFRAN) tablet 4 mg  4 mg Oral Q6H PRN Emokpae, Ejiroghene E, MD       Or   ondansetron (ZOFRAN) injection 4 mg  4 mg Intravenous Q6H PRN Emokpae, Ejiroghene E, MD         Discharge Medications: Please see discharge summary for a list of discharge medications.  Relevant Imaging Results:  Relevant Lab Results:   Additional  Information SSN: [id number].  Joaquin Courts, RN

## 2021-06-21 NOTE — TOC Progression Note (Signed)
Transition of Care [facility]) - Progression Note    Patient Details  Name: Sarah Moyer MRN: [id number] Date of Birth: [birth year]  Transition of Care [facility]) CM/SW Contact  Joaquin Courts, RN Phone Number: 06/21/2021, 4:17 PM  Clinical Narrative:    CM spoke with patient's son who reports they would like to accept bed offer from [facility] rep notified of facility selection and requested to initiate insurance auth.     Expected Discharge Plan: [place] Barriers to Discharge: Continued Medical Work up  Expected Discharge Plan and Services Expected Discharge Plan: [place]   Discharge Planning Services: CM Consult Post Acute Care Choice: [place] Living arrangements for the past 2 months: Single Family Home                                       Social Determinants of Health (SDOH) Interventions    Readmission Risk Interventions No flowsheet data found.

## 2021-06-21 NOTE — Evaluation (Signed)
Physical Therapy Evaluation Patient Details Name: Sarah Moyer MRN: [id number] DOB: 1930/03/07 Today's Date: 06/21/2021   History of Present Illness  Sarah Moyer is a 87 y.o. female with medical history significant for dementia, breast cancer, hypertension.  Patient was brought to the ED reports of generalized weakness that started 4 days ago on the 21st.  Daughter reports patient's donepezil was started 2 days prior, and she was taking it in the morning ( as opposed to afternoon/evening in the past).  No confusion was noted, no facial asymmetry, she is able to move all extremities, but she is too weak to get up or out bed.  Patient reported her head felt too heavy for her.  Patient has chronic poor oral intake, over the past 2 days patient has not eaten or drank anything.  No vomiting no loose stools no cough or difficulty breathing no chest pain.   Clinical Impression  Patient demonstrates slow labored movement for sitting up at bedside, at high risk for falls due to unsteadiness when standing having to lean on nearby objects for support, had to use RW for safety demonstrating poor carryover requiring frequent verbal cues to keep hands on walker.  Patient tolerated sitting up in chair after therapy family members present in room.  Patient will benefit from continued physical therapy in hospital and recommended venue below to increase strength, balance, endurance for safe ADLs and gait.       Follow Up Recommendations SNF    Equipment Recommendations  None recommended by PT    Recommendations for Other Services       Precautions / Restrictions Precautions Precautions: Fall Restrictions Weight Bearing Restrictions: No      Mobility  Bed Mobility Overal bed mobility: Needs Assistance Bed Mobility: Supine to Sit     Supine to sit: Min assist;Mod assist     General bed mobility comments: defer to OT notes    Transfers Overall transfer level: Needs  assistance Equipment used: Rolling walker (2 wheeled) Transfers: Sit to/from [facility] Sit to Stand: Min assist;Mod assist Stand pivot transfers: Mod assist       General transfer comment: defer to OT notes  Ambulation/Gait Ambulation/Gait assistance: Mod assist Gait Distance (Feet): 18 Feet Assistive device: Rolling walker (2 wheeled) Gait Pattern/deviations: Decreased step length - right;Decreased step length - left;Decreased stride length;Drifts right/left Gait velocity: decreased   General Gait Details: slow labored unsteady cadence with frequent drifting left/right and requiring frequent verbal cues for safety due to taking hands off walker with near falls  Stairs            Wheelchair Mobility    Modified Rankin (Stroke Patients Only)       Balance Overall balance assessment: Needs assistance Sitting-balance support: Feet supported;No upper extremity supported Sitting balance-Leahy Scale: Fair Sitting balance - Comments: fair to good seated at EOB   Standing balance support: Bilateral upper extremity supported;During functional activity Standing balance-Leahy Scale: Poor Standing balance comment: using RW                             Pertinent Vitals/Pain Pain Assessment: No/denies pain    Home Living Family/patient expects to be discharged to:: Private residence Living Arrangements: Spouse/significant other Available Help at Discharge: Family;Available 24 hours/day Type of Home: House Home Access: Stairs to enter Entrance Stairs-Rails: None Entrance Stairs-Number of Steps: 1 Home Layout: Laundry or work area in basement;Two level;Able to live on  main level with bedroom/bathroom Home Equipment: Walker - 2 wheels;Walker - 4 wheels;Cane - quad;Shower seat;Grab bars - tub/shower      Prior Function Level of Independence: Needs assistance   Gait / Transfers Assistance Needed: Household ambulator without AD. Pt also reportedly  walked to mailbox and outside at times without AD.  ADL's / Homemaking Assistance Needed: Independent for ADL's with assist for IADL's from family.        Hand Dominance   Dominant Hand: Right    Extremity/Trunk Assessment   Upper Extremity Assessment Upper Extremity Assessment: Defer to OT evaluation    Lower Extremity Assessment Lower Extremity Assessment: Generalized weakness    Cervical / Trunk Assessment Cervical / Trunk Assessment: Kyphotic  Communication   Communication: HOH  Cognition Arousal/Alertness: Awake/alert Behavior During Therapy: Impulsive Overall Cognitive Status: History of cognitive impairments - at baseline                                 General Comments: Pt impulsive during functional ambulation.      General Comments      Exercises     Assessment/Plan    PT Assessment Patient needs continued PT services  PT Problem List Decreased strength;Decreased activity tolerance;Decreased balance;Decreased mobility       PT Treatment Interventions DME instruction;Gait training;Stair training;Functional mobility training;Therapeutic activities;Therapeutic exercise;Patient/family education;Balance training    PT Goals (Current goals can be found in the Care Plan section)  Acute Rehab PT Goals Patient Stated Goal: return home PT Goal Formulation: With patient/family Time For Goal Achievement: 07/05/21 Potential to Achieve Goals: Good    Frequency Min 3X/week   Barriers to discharge        Co-evaluation PT/OT/SLP Co-Evaluation/Treatment: Yes Reason for Co-Treatment: Complexity of the patient's impairments (multi-system involvement);To address functional/ADL transfers PT goals addressed during session: Mobility/safety with mobility;Proper use of DME;Balance OT goals addressed during session: ADL's and self-care;Strengthening/ROM       AM-PAC PT "6 Clicks" Mobility  Outcome Measure Help needed turning from your back to your  side while in a flat bed without using bedrails?: A Little Help needed moving from lying on your back to sitting on the side of a flat bed without using bedrails?: A Little Help needed moving to and from a bed to a chair (including a wheelchair)?: A Lot Help needed standing up from a chair using your arms (e.g., wheelchair or bedside chair)?: A Lot Help needed to walk in hospital room?: A Lot Help needed climbing 3-5 steps with a railing? : Total 6 Click Score: 13    End of Session   Activity Tolerance: Patient tolerated treatment well Patient left: in chair;with call bell/phone within reach;with chair alarm set;with family/visitor present Nurse Communication: Mobility status PT Visit Diagnosis: Unsteadiness on feet (R26.81);Other abnormalities of gait and mobility (R26.89);Muscle weakness (generalized) (M62.81)    Time: [id number] PT Time Calculation (min) (ACUTE ONLY): 25 min   Charges:   PT Evaluation $PT Eval Moderate Complexity: 1 Mod PT Treatments $Therapeutic Activity: 23-37 mins        12:37 PM, 06/21/21 Lonell Grandchild, MPT Physical Therapist with [facility] 336 [phone] office [phone] mobile phone

## 2021-06-21 NOTE — Plan of Care (Signed)
  Problem: Acute Rehab PT Goals(only PT should resolve) Goal: Pt Will Go Supine/Side To Sit Outcome: Progressing Flowsheets (Taken 06/21/2021 1238) Pt will go Supine/Side to Sit:  with modified independence  with supervision Goal: Patient Will Transfer Sit To/From Stand Outcome: Progressing Flowsheets (Taken 06/21/2021 1238) Patient will transfer sit to/from stand:  with minimal assist  with min guard assist Goal: Pt Will Transfer Bed To Chair/Chair To Bed Outcome: Progressing Flowsheets (Taken 06/21/2021 1238) Pt will Transfer Bed to Chair/Chair to Bed:  with min assist  min guard assist Goal: Pt Will Ambulate Outcome: Progressing Flowsheets (Taken 06/21/2021 1238) Pt will Ambulate:  50 feet  with minimal assist  with rolling walker   12:38 PM, 06/21/21 Lonell Grandchild, MPT Physical Therapist with [facility] [phone] office [phone] mobile phone

## 2021-06-21 NOTE — TOC Initial Note (Signed)
Transition of Care [facility]) - Initial/Assessment Note    Patient Details  Name: Sarah Moyer MRN: [id number] Date of Birth: [birth year]  Transition of Care (TOC) CM/SW Contact:    Joaquin Courts, RN Phone Number: 06/21/2021, 2:32 PM  Clinical Narrative:                 Cm met with patient, spouse and son at bedside to discuss discharge planning.  CM discussed PT recommendation for SNf for short term rehab.  Patient initially hesitant as she wishes to return home, however spouse and son discussed with patient the benefits of getting stronger before returning home where spouse is available to help care for her.    CM discussed the snf search and placement process including need for insurance authorization for a SNF stay.  Patient and spouse prefer to stay close to home and request for search to be in [facility].    CM faxed out FL2 to area SNF, family expresses patient has previously gone to [facility] and that would be their first choice is available.  Cm also verified that patient's insurance plan is not managed by [facility], auth will need to be obtained by facility.    Expected Discharge Plan: Skilled Nursing Facility Barriers to Discharge: Continued Medical Work up   Patient Goals and CMS Choice Patient states their goals for this hospitalization and ongoing recovery are:: to go back home CMS Medicare.gov Compare Post Acute Care list provided to:: Patient Choice offered to / list presented to : Patient, Adult Children, Spouse  Expected Discharge Plan and Services Expected Discharge Plan: [place]   Discharge Planning Services: CM Consult Post Acute Care Choice: [place] Living arrangements for the past 2 months: Single Family Home                                      Prior Living Arrangements/Services Living arrangements for the past 2 months: Single Family Home Lives with:: Spouse Patient language and need for interpreter  reviewed:: Yes Do you feel safe going back to the place where you live?: Yes      Need for Family Participation in Patient Care: Yes (Comment) Care giver support system in place?: Yes (comment)   Criminal Activity/Legal Involvement Pertinent to Current Situation/Hospitalization: No - Comment as needed  Activities of Daily Living Home Assistive Devices/Equipment: None ADL Screening (condition at time of admission) Patient's cognitive ability adequate to safely complete daily activities?: No Is the patient deaf or have difficulty hearing?: Yes Does the patient have difficulty seeing, even when wearing glasses/contacts?: No Does the patient have difficulty concentrating, remembering, or making decisions?: Yes Patient able to express need for assistance with ADLs?: Yes Does the patient have difficulty dressing or bathing?: No Independently performs ADLs?: Yes (appropriate for developmental age) Does the patient have difficulty walking or climbing stairs?: Yes Weakness of Legs: Both Weakness of Arms/Hands: Both  Permission Sought/Granted                  Emotional Assessment Appearance:: Appears stated age Attitude/Demeanor/Rapport: Engaged Affect (typically observed): Accepting Orientation: : Oriented to Self, Oriented to Place, Oriented to  Time, Oriented to Situation   Psych Involvement: No (comment)  Admission diagnosis:  Metabolic encephalopathy [W38.93] Altered mental status, unspecified altered mental status type [R41.82] Patient Active Problem List   Diagnosis Date Noted   Generalized weakness 06/20/2021  Failure to thrive in adult 06/20/2021   Closed right hip fracture ([place]) 02/02/2017   Dementia ([place]) 02/02/2017   Left upper quadrant pain 12/10/2015   History of left hip replacement 06/12/2014   Osteoporosis 06/12/2014   Dizziness 02/15/2014   Ductal carcinoma in situ (DCIS) of left breast 12/23/2013   Breast cancer of upper-outer quadrant of left female breast  ([place]) 11/09/2013   Hyperlipidemia 06/20/2012   Leukocytosis 06/20/2012   Fracture of femoral neck, left, closed ([place]) 06/20/2012   Essential hypertension, benign 09/12/2010   DYSPNEA 09/12/2010   ELECTROCARDIOGRAM, ABNORMAL 09/12/2010   PCP:  Asencion Noble, MD Pharmacy:   [place], [place] - [address] [place] [address] Phone: [phone] Fax: [phone]  [place], [place] - [address] [place] [address] Phone: [phone] Fax: [phone]  [facility] ([facility] - [place], [place] [place] [address] Phone: [phone] Fax: [phone]     Social Determinants of Health (SDOH) Interventions    Readmission Risk Interventions No flowsheet data found.

## 2021-06-21 NOTE — Progress Notes (Signed)
Initial Nutrition Assessment  DOCUMENTATION CODES:      INTERVENTION:  Ensure Enlive po BID, each supplement provides 350 kcal and 20 grams of protein   Regular diet - provide tray set up if family not present  Offer snacks between meals as desired  NUTRITION DIAGNOSIS:   Inadequate oral intake related to poor appetite as evidenced by per patient/family report.   GOAL:  Patient will meet greater than or equal to 90% of their needs   MONITOR:  PO intake, Supplement acceptance, Labs, Weight trends  REASON FOR ASSESSMENT:   Malnutrition Screening Tool    ASSESSMENT: Patient is a 87 yo female with hx of dementia, HTN and breast cancer. Presents with failure to thrive, chronic poor appetite.    Family at bedside and patient is up in chair -sleeping. Lunch has not arrived. Family reports poor oral intake for the past several days. They have brought in some of her favorite snacks hoping to entice her to eat. There is a boost breeze here ([place]) which she likes ok but prefers chocolate flavor. RD brought an Ensure Plus. Encouraged them to try food first but is not eating provide her the Ensure. She is taking a medication to help stimulate her appetite.   Her usual weight is 115 lb (52.2 kg) current weight is 116.8 lb (53.1 kg).   Medications include: Megace, Vitamin B-12 (IM) 1000 mcg.   Labs reviewed. CMP/CBC  IVF-D5% @ 100 ml/hr  NUTRITION - FOCUSED PHYSICAL EXAM: Unable to complete Nutrition-Focused physical exam at this time.    Diet Order:   Diet Order             Diet regular Room service appropriate? Yes; Fluid consistency: Thin  Diet effective now                   EDUCATION NEEDS:  Education needs have been addressed  Skin:  Skin Assessment: Reviewed RN Assessment  Last BM:  8/26  Height:   Ht Readings from Last 1 Encounters:  06/20/21 '5\' 4"'$  (1.626 m)    Weight:   Wt Readings from Last 1 Encounters:  06/20/21 53.1 kg    Ideal Body Weight:    55 kg  BMI:  Body mass index is 20.08 kg/m.  Estimated Nutritional Needs:   Kcal:  1400-1500  Protein:  65-70 gr  Fluid:  >1300 ml daily  Colman Cater MS,RD,CSG,LDN Contact: Shea Evans

## 2021-06-22 DIAGNOSIS — R627 Adult failure to thrive: Secondary | ICD-10-CM | POA: Diagnosis not present

## 2021-06-22 LAB — VITAMIN D 25 HYDROXY (VIT D DEFICIENCY, FRACTURES): Vit D, 25-Hydroxy: 70.88 ng/mL (ref 30–100)

## 2021-06-22 LAB — RPR: RPR Ser Ql: NONREACTIVE

## 2021-06-22 LAB — T3, FREE: T3, Free: 2.8 pg/mL (ref 2.0–4.4)

## 2021-06-22 MED ORDER — VITAMIN B-12 1000 MCG PO TABS
1000.0000 ug | ORAL_TABLET | Freq: Every day | ORAL | Status: DC
  Administered 2021-06-22 – 2021-06-25 (×4): 1000 ug via ORAL
  Filled 2021-06-22 (×4): qty 1

## 2021-06-22 NOTE — Progress Notes (Signed)
[place] [facility] PROGRESS NOTE    Sarah Moyer  [id number] DOB: 1930/06/27 DOA: 06/20/2021 PCP: Asencion Noble, MD      Brief Narrative:  Mrs. Sarah Moyer is a 87 y.o. F with dementia, home dwelling who presented with confusion episodic in the evening.  Evidently the patient has had a slow decline in her functional status, and then a few days prior to admission was noted to be weaker, and then at night to have some confusion, agitation after recently starting donepezil.  The ER she was afebrile, somewhat bradycardic, blood pressure normal, sats normal, WBC normal, chest x-ray clear, UA unremarkable CT head unremarkable.  She was started on fluids and the hospitalist service were asked to evaluate for possible acute metabolic encephalopathy.      Assessment & Plan:  Generalized weakness Failure to thrive in the setting of advancing dementia Unclear how much of this is donepezil, given the temporal coincidence and how much is progression of dementia.  Clearly here we have found no metabolic derangement other than mild B12 deficiency. - PT - Replete B12 - Nutrition supplements - No need for fluids - Reasonable to try Megace   B12 deficiency -Continue B12 repletion  Dementia -Hold donepezil  Acute metabolic encephalopathy ruled out The patient's mentation appears to be close to her baseline per sister at the bedside.  I would expect someone her age to have sundowning.   Vascular disease primary prevention - Continue aspirin           Disposition: Status is: Observation   Dispo: The patient is from: Home              Anticipated d/c is to: SNF              Patient currently is medically stable to d/c.   Difficult to place patient Yes       Level of care: Med-Surg       MDM: The below labs and imaging reports were reviewed and summarized above.  Medication management as above.    DVT prophylaxis: enoxaparin (LOVENOX) injection 40 mg  Start: 06/20/21 2045  Code Status: FULL Family Communication: Sister at the bedside    Consultants:    Procedures:    Antimicrobials:     Culture data:             Subjective: Patient is asking to go home.  She denies other complaints.  She appears tired.  No respiratory distress, no fever.  Objective: Vitals:   06/21/21 1400 06/21/21 1933 06/22/21 0546 06/22/21 1329  BP: (!) 144/94 (!) 142/54 (!) 158/61 (!) 143/59  Pulse: (!) 50 (!) 57 (!) 53 (!) 57  Resp: '12 20 18 16  '$ Temp: 99.1 F (37.3 C) 98.6 F (37 C) 98 F (36.7 C) 98 F (36.7 C)  TempSrc: Axillary Oral Oral Oral  SpO2: 100% 99% 100% 99%  Weight:      Height:        Intake/Output Summary (Last 24 hours) at 06/22/2021 1649 Last data filed at 06/22/2021 1504 Gross per 24 hour  Intake 3538.21 ml  Output 1250 ml  Net 2288.21 ml   Filed Weights   06/20/21 1309  Weight: 53.1 kg    Examination: General appearance: Frail elderly adult female, awake, somewhat distracted but in no obvious distress.   HEENT: Anicteric, conjunctiva pink, lids and lashes normal for age. No nasal deformity, discharge, epistaxis.  Lips moist, dentition in good repair, oropharynx moist, no oral lesions.  Skin: Warm and dry.  no jaundice.  No suspicious rashes or lesions. Cardiac: RRR, nl S1-S2, no murmurs appreciated.  No lower extremity edema Respiratory: Normal respiratory rate and rhythm.  CTAB without rales or wheezes. Abdomen: Abdomen soft.  no TTP. No ascites, distension, hepatosplenomegaly.   MSK: No deformities or effusions.  Moderate diffuse loss of subcutaneous muscle mass and fat Neuro: Awake and alert.  EOMI, moves all extremities generalized weakness. Speech fluent.    Psych: Sensorium intact and responding to questions, attention distracted, affect blunted, judgment insight appear impaired by dementia      Data Reviewed: I have personally reviewed following labs and imaging studies:  CBC: Recent Labs   Lab 24-Jun-2021 1816 06/20/21 1452  WBC 8.1 8.5  NEUTROABS  --  5.7  HGB 15.0 14.3  HCT 43.6 42.4  MCV 92.6 93.4  PLT 250 0000000   Basic Metabolic Panel: Recent Labs  Lab 2021-06-24 1816 06/20/21 1452 06/21/21 0846  NA 139 140 141  K 3.6 3.8 4.4  CL 108 108 110  CO2 '23 27 26  '$ GLUCOSE 98 75 90  BUN '12 11 11  '$ CREATININE 0.82 0.92 0.76  CALCIUM 9.0 9.0 8.7*   GFR: Estimated Creatinine Clearance: 40.7 mL/min (by C-G formula based on SCr of 0.76 mg/dL). Liver Function Tests: Recent Labs  Lab Jun 24, 2021 1816 06/20/21 1452  AST 15 14*  ALT 12 12  ALKPHOS 67 59  BILITOT 0.7 0.9  PROT 6.4* 6.1*  ALBUMIN 3.8 3.7   No results for input(s): LIPASE, AMYLASE in the last 168 hours. Recent Labs  Lab 06/21/21 0846  AMMONIA 11   Coagulation Profile: No results for input(s): INR, PROTIME in the last 168 hours. Cardiac Enzymes: No results for input(s): CKTOTAL, CKMB, CKMBINDEX, TROPONINI in the last 168 hours. BNP (last 3 results) No results for input(s): PROBNP in the last 8760 hours. HbA1C: No results for input(s): HGBA1C in the last 72 hours. CBG: Recent Labs  Lab 06/20/21 1825  GLUCAP 99   Lipid Profile: No results for input(s): CHOL, HDL, LDLCALC, TRIG, CHOLHDL, LDLDIRECT in the last 72 hours. Thyroid Function Tests: Recent Labs    06/20/21 1453 06/21/21 0846  TSH 0.264*  --   FREET4  --  1.03  T3FREE  --  2.8   Anemia Panel: Recent Labs    06/21/21 0557  VITAMINB12 129*  FOLATE 8.3   Urine analysis:    Component Value Date/Time   COLORURINE STRAW (A) 06/20/2021 1452   APPEARANCEUR CLEAR 06/20/2021 1452   LABSPEC 1.004 (L) 06/20/2021 1452   PHURINE 8.0 06/20/2021 1452   GLUCOSEU NEGATIVE 06/20/2021 1452   HGBUR NEGATIVE 06/20/2021 1452   BILIRUBINUR NEGATIVE 06/20/2021 1452   KETONESUR NEGATIVE 06/20/2021 1452   PROTEINUR NEGATIVE 06/20/2021 1452   UROBILINOGEN 1.0 06/20/2012 1802   NITRITE NEGATIVE 06/20/2021 1452   LEUKOCYTESUR NEGATIVE 06/20/2021  1452   Sepsis Labs: '@LABRCNTIP'$ (procalcitonin:4,lacticacidven:4)  ) Recent Results (from the past 240 hour(s))  Resp Panel by RT-PCR (Flu A&B, Covid) Nasopharyngeal Swab     Status: None   Collection Time: 06/20/21  4:45 PM   Specimen: Nasopharyngeal Swab; Nasopharyngeal(NP) swabs in vial transport medium  Result Value Ref Range Status   SARS Coronavirus 2 by RT PCR NEGATIVE NEGATIVE Final    Comment: (NOTE) SARS-CoV-2 target nucleic acids are NOT DETECTED.  The SARS-CoV-2 RNA is generally detectable in upper respiratory specimens during the acute phase of infection. The lowest concentration of SARS-CoV-2 viral copies this assay can  detect is 138 copies/mL. A negative result does not preclude SARS-Cov-2 infection and should not be used as the sole basis for treatment or other patient management decisions. A negative result may occur with  improper specimen collection/handling, submission of specimen other than nasopharyngeal swab, presence of viral mutation(s) within the areas targeted by this assay, and inadequate number of viral copies(<138 copies/mL). A negative result must be combined with clinical observations, patient history, and epidemiological information. The expected result is Negative.  Fact Sheet for Patients:  EntrepreneurPulse.com.au  Fact Sheet for Healthcare Providers:  IncredibleEmployment.be  This test is no t yet approved or cleared by the [place] FDA and  has been authorized for detection and/or diagnosis of SARS-CoV-2 by FDA under an Emergency Use Authorization (EUA). This EUA will remain  in effect (meaning this test can be used) for the duration of the COVID-19 declaration under Section 564(b)(1) of the Act, 21 U.S.C.section 360bbb-3(b)(1), unless the authorization is terminated  or revoked sooner.       Influenza A by PCR NEGATIVE NEGATIVE Final   Influenza B by PCR NEGATIVE NEGATIVE Final    Comment:  (NOTE) The Xpert Xpress SARS-CoV-2/FLU/RSV plus assay is intended as an aid in the diagnosis of influenza from Nasopharyngeal swab specimens and should not be used as a sole basis for treatment. Nasal washings and aspirates are unacceptable for Xpert Xpress SARS-CoV-2/FLU/RSV testing.  Fact Sheet for Patients: EntrepreneurPulse.com.au  Fact Sheet for Healthcare Providers: IncredibleEmployment.be  This test is not yet approved or cleared by the [place] FDA and has been authorized for detection and/or diagnosis of SARS-CoV-2 by FDA under an Emergency Use Authorization (EUA). This EUA will remain in effect (meaning this test can be used) for the duration of the COVID-19 declaration under Section 564(b)(1) of the Act, 21 U.S.C. section 360bbb-3(b)(1), unless the authorization is terminated or revoked.  Performed at [facility], [address] [place], [place] [address]          Radiology Studies: No results found.      Scheduled Meds:  aspirin  81 mg Oral Daily   cyanocobalamin  1,000 mcg Intramuscular Daily   enoxaparin (LOVENOX) injection  40 mg Subcutaneous Q24H   feeding supplement  237 mL Oral BID BM   megestrol  40 mg Oral Daily   Continuous Infusions:   LOS: 0 days    Time spent: 25 minutes    Edwin Dada, MD [facility] 06/22/2021, 4:49 PM     Please page though [place] or Epic secure chat:  For [employer], Adult nurse

## 2021-06-22 NOTE — Care Management Obs Status (Signed)
[facility] NOTIFICATION   Patient Details  Name: Sarah Moyer MRN: [id number] Date of Birth: 1930-02-27   Medicare Observation Status Notification Given:  Yes    Boneta Lucks, RN 06/22/2021, 8:42 AM

## 2021-06-22 NOTE — Progress Notes (Signed)
Pt assisted up into recliner per pt request. Required assist x1, good strength but unsteady on feet. Took 4 steps to chair and able to sit with controlled descent. Pt is complaining of right upper arm/shoulder area pain, no reddness or swelling noted to area. ROJM exercises being performed by pt's daughter at this time. Pt states ready to eat a little breakfast.  Pt c/o pain in left arm at IV site (left [facility]). Site flushes well but tender to touch. Site removed and will restart in different site once pt finishes breakfast. Pt and daughter agreeable.

## 2021-06-23 DIAGNOSIS — R627 Adult failure to thrive: Secondary | ICD-10-CM | POA: Diagnosis not present

## 2021-06-23 MED ORDER — MIRTAZAPINE 15 MG PO TABS
15.0000 mg | ORAL_TABLET | Freq: Every day | ORAL | Status: DC
  Administered 2021-06-23 – 2021-06-24 (×2): 15 mg via ORAL
  Filled 2021-06-23 (×2): qty 1

## 2021-06-23 MED ORDER — HYDRALAZINE HCL 25 MG PO TABS: 25.0000 mg | ORAL_TABLET | Freq: Three times a day (TID) | ORAL | Status: DC | PRN

## 2021-06-23 NOTE — Progress Notes (Signed)
[facility] PROGRESS NOTE    Sarah Moyer  [id number] DOB: Jul 28, 1930 DOA: 06/20/2021 PCP: Asencion Noble, MD      Brief Narrative:  Sarah Moyer is a 87 y.o. F with dementia, home dwelling who presented with confusion episodic in the evening.  Evidently the patient has had a slow decline in her functional status, and then a few days prior to admission was noted to be weaker, "could not hold her head up", and then at night to have some confusion, agitation, so family brought her to the emergency room twice.  First time she.  Unremarkable and was discharged home, second time she appeared weak and so she was admitted for work-up.  In the ER the second time, she was afebrile, somewhat bradycardic, blood pressure normal, sats normal, WBC normal, chest x-ray clear, UA unremarkable CT head unremarkable.  She was started on fluids and the hospitalist service were asked to evaluate for possible acute metabolic encephalopathy.            Assessment & Plan:  Generalized weakness Failure to thrive in the setting of advancing dementia Initially this seemed worse after restarting donepezil (which she had been on for years prior, but stopped and recently restarted).  I suspect much is progression of dementia.  Clearly here we have found no metabolic derangement other than mild B12 deficiency.  No infection present.  No signs of stroke.  TSH slightly low but T3/T4 normal.    - PT - Replete B12 - Nutrition supplements - No need for fluids - Stop Megace given hypertension - Resume home mirtazapine   B12 deficiency - Continue PO B12  Dementia -Hold Donepezil, as family noticed no improvmeent and she seemed to worsen here recently after restarting.    - Outpatient MRI is pending, unsure what this would show   Acute metabolic encephalopathy ruled out The patient's mentation appears to be close to her baseline per sister at the bedside.  I would expect someone her  age to have sundowning.   Vascular disease primary prevention - Continue aspirin           Disposition: Status is: Observation   Dispo: The patient is from: Home              Anticipated d/c is to: SNF              Patient currently is medically stable to d/c.   Difficult to place patient Yes       Level of care: Med-Surg       MDM: The below labs and imaging reports were reviewed and summarized above.  Medication management as above.    DVT prophylaxis: enoxaparin (LOVENOX) injection 40 mg Start: 06/20/21 2045  Code Status: FULL Family Communication: Daughter at the bedside    Consultants:    Procedures:    Antimicrobials:     Culture data:             Subjective: Patient still weak, mostly sleeping.  Denies other complaints.  No fever.  No observed respiratory distress, vomiting, pain complaints from nursing.    Objective: Vitals:   06/22/21 1329 06/22/21 2143 06/23/21 0532 06/23/21 1450  BP: (!) 143/59 (!) 170/63 (!) 179/70 (!) 168/72  Pulse: (!) 57 (!) 56 (!) 57 (!) 56  Resp: '16 19  19  '$ Temp: 98 F (36.7 C) 98.7 F (37.1 C) 98.2 F (36.8 C) 98.6 F (37 C)  TempSrc: Oral Oral Oral Oral  SpO2:  99% 98% 100% 100%  Weight:      Height:        Intake/Output Summary (Last 24 hours) at 06/23/2021 1716 Last data filed at 06/23/2021 1300 Gross per 24 hour  Intake 360 ml  Output 1500 ml  Net -1140 ml   Filed Weights   06/20/21 1309  Weight: 53.1 kg    Examination: General appearance: Frail elderly female, sleeping, arouses to touch.     HEENT:    Skin:  Cardiac: RRR, no murmurs, no lower extremity edema Respiratory: Normal respiratory rate and rhythm, lungs clear without rales or wheezes Abdomen: Abdomen soft without tenderness palpation or guarding, no ascites or distention MSK:  Neuro: Arouses easily, makes eye contact, extraocular movement is intact, face symmetric, moves upper extremities with generalized weakness  but symmetric strength Psych: Attention diminished, affect blunted, judgment insight appears fairly impaired by dementia     Data Reviewed: I have personally reviewed following labs and imaging studies:  CBC: Recent Labs  Lab 06/19/21 1816 06/20/21 1452  WBC 8.1 8.5  NEUTROABS  --  5.7  HGB 15.0 14.3  HCT 43.6 42.4  MCV 92.6 93.4  PLT 250 0000000   Basic Metabolic Panel: Recent Labs  Lab 06/19/21 1816 06/20/21 1452 06/21/21 0846  NA 139 140 141  K 3.6 3.8 4.4  CL 108 108 110  CO2 '23 27 26  '$ GLUCOSE 98 75 90  BUN '12 11 11  '$ CREATININE 0.82 0.92 0.76  CALCIUM 9.0 9.0 8.7*   GFR: Estimated Creatinine Clearance: 40.7 mL/min (by C-G formula based on SCr of 0.76 mg/dL). Liver Function Tests: Recent Labs  Lab 06/19/21 1816 06/20/21 1452  AST 15 14*  ALT 12 12  ALKPHOS 67 59  BILITOT 0.7 0.9  PROT 6.4* 6.1*  ALBUMIN 3.8 3.7   No results for input(s): LIPASE, AMYLASE in the last 168 hours. Recent Labs  Lab 06/21/21 0846  AMMONIA 11   Coagulation Profile: No results for input(s): INR, PROTIME in the last 168 hours. Cardiac Enzymes: No results for input(s): CKTOTAL, CKMB, CKMBINDEX, TROPONINI in the last 168 hours. BNP (last 3 results) No results for input(s): PROBNP in the last 8760 hours. HbA1C: No results for input(s): HGBA1C in the last 72 hours. CBG: Recent Labs  Lab 06/20/21 1825  GLUCAP 99   Lipid Profile: No results for input(s): CHOL, HDL, LDLCALC, TRIG, CHOLHDL, LDLDIRECT in the last 72 hours. Thyroid Function Tests: Recent Labs    06/21/21 0846  FREET4 1.03  T3FREE 2.8   Anemia Panel: Recent Labs    06/21/21 0557  VITAMINB12 129*  FOLATE 8.3   Urine analysis:    Component Value Date/Time   COLORURINE STRAW (A) 06/20/2021 1452   APPEARANCEUR CLEAR 06/20/2021 1452   LABSPEC 1.004 (L) 06/20/2021 1452   PHURINE 8.0 06/20/2021 1452   GLUCOSEU NEGATIVE 06/20/2021 1452   HGBUR NEGATIVE 06/20/2021 1452   BILIRUBINUR NEGATIVE 06/20/2021  1452   KETONESUR NEGATIVE 06/20/2021 1452   PROTEINUR NEGATIVE 06/20/2021 1452   UROBILINOGEN 1.0 06/20/2012 1802   NITRITE NEGATIVE 06/20/2021 1452   LEUKOCYTESUR NEGATIVE 06/20/2021 1452   Sepsis Labs: '@LABRCNTIP'$ (procalcitonin:4,lacticacidven:4)  ) Recent Results (from the past 240 hour(s))  Resp Panel by RT-PCR (Flu A&B, Covid) Nasopharyngeal Swab     Status: None   Collection Time: 06/20/21  4:45 PM   Specimen: Nasopharyngeal Swab; Nasopharyngeal(NP) swabs in vial transport medium  Result Value Ref Range Status   SARS Coronavirus 2 by RT PCR NEGATIVE NEGATIVE Final  Comment: (NOTE) SARS-CoV-2 target nucleic acids are NOT DETECTED.  The SARS-CoV-2 RNA is generally detectable in upper respiratory specimens during the acute phase of infection. The lowest concentration of SARS-CoV-2 viral copies this assay can detect is 138 copies/mL. A negative result does not preclude SARS-Cov-2 infection and should not be used as the sole basis for treatment or other patient management decisions. A negative result may occur with  improper specimen collection/handling, submission of specimen other than nasopharyngeal swab, presence of viral mutation(s) within the areas targeted by this assay, and inadequate number of viral copies(<138 copies/mL). A negative result must be combined with clinical observations, patient history, and epidemiological information. The expected result is Negative.  Fact Sheet for Patients:  EntrepreneurPulse.com.au  Fact Sheet for Healthcare Providers:  IncredibleEmployment.be  This test is no t yet approved or cleared by the [place] FDA and  has been authorized for detection and/or diagnosis of SARS-CoV-2 by FDA under an Emergency Use Authorization (EUA). This EUA will remain  in effect (meaning this test can be used) for the duration of the COVID-19 declaration under Section 564(b)(1) of the Act, 21 U.S.C.section  360bbb-3(b)(1), unless the authorization is terminated  or revoked sooner.       Influenza A by PCR NEGATIVE NEGATIVE Final   Influenza B by PCR NEGATIVE NEGATIVE Final    Comment: (NOTE) The Xpert Xpress SARS-CoV-2/FLU/RSV plus assay is intended as an aid in the diagnosis of influenza from Nasopharyngeal swab specimens and should not be used as a sole basis for treatment. Nasal washings and aspirates are unacceptable for Xpert Xpress SARS-CoV-2/FLU/RSV testing.  Fact Sheet for Patients: EntrepreneurPulse.com.au  Fact Sheet for Healthcare Providers: IncredibleEmployment.be  This test is not yet approved or cleared by the [place] FDA and has been authorized for detection and/or diagnosis of SARS-CoV-2 by FDA under an Emergency Use Authorization (EUA). This EUA will remain in effect (meaning this test can be used) for the duration of the COVID-19 declaration under Section 564(b)(1) of the Act, 21 U.S.C. section 360bbb-3(b)(1), unless the authorization is terminated or revoked.  Performed at [facility], [address], [place], [place] [address]          Radiology Studies: No results found.      Scheduled Meds:  aspirin  81 mg Oral Daily   enoxaparin (LOVENOX) injection  40 mg Subcutaneous Q24H   feeding supplement  237 mL Oral BID BM   mirtazapine  15 mg Oral QHS   vitamin B-12  1,000 mcg Oral Daily   Continuous Infusions:   LOS: 0 days    Time spent: 25 minutes    Edwin Dada, MD [facility] 06/23/2021, 5:16 PM     Please page though [place] or Epic secure chat:  For [employer], Adult nurse

## 2021-06-24 ENCOUNTER — Encounter (HOSPITAL_COMMUNITY): Payer: Self-pay | Admitting: Internal Medicine

## 2021-06-24 ENCOUNTER — Observation Stay (HOSPITAL_COMMUNITY): Payer: Medicare HMO

## 2021-06-24 DIAGNOSIS — Z7189 Other specified counseling: Secondary | ICD-10-CM | POA: Diagnosis not present

## 2021-06-24 DIAGNOSIS — R627 Adult failure to thrive: Secondary | ICD-10-CM | POA: Diagnosis not present

## 2021-06-24 DIAGNOSIS — M25511 Pain in right shoulder: Secondary | ICD-10-CM | POA: Diagnosis not present

## 2021-06-24 DIAGNOSIS — Z515 Encounter for palliative care: Secondary | ICD-10-CM | POA: Diagnosis not present

## 2021-06-24 DIAGNOSIS — R531 Weakness: Secondary | ICD-10-CM | POA: Diagnosis not present

## 2021-06-24 DIAGNOSIS — R413 Other amnesia: Secondary | ICD-10-CM

## 2021-06-24 LAB — CBC
HCT: 41 % (ref 36.0–46.0)
Hemoglobin: 14 g/dL (ref 12.0–15.0)
MCH: 31.5 pg (ref 26.0–34.0)
MCHC: 34.1 g/dL (ref 30.0–36.0)
MCV: 92.1 fL (ref 80.0–100.0)
Platelets: 201 10*3/uL (ref 150–400)
RBC: 4.45 MIL/uL (ref 3.87–5.11)
RDW: 12.5 % (ref 11.5–15.5)
WBC: 11.4 10*3/uL — ABNORMAL HIGH (ref 4.0–10.5)
nRBC: 0 % (ref 0.0–0.2)

## 2021-06-24 LAB — BASIC METABOLIC PANEL
Anion gap: 6 (ref 5–15)
BUN: 16 mg/dL (ref 8–23)
CO2: 25 mmol/L (ref 22–32)
Calcium: 9.3 mg/dL (ref 8.9–10.3)
Chloride: 107 mmol/L (ref 98–111)
Creatinine, Ser: 0.75 mg/dL (ref 0.44–1.00)
GFR, Estimated: >60 mL/min (ref >60)
Glucose, Bld: 94 mg/dL (ref 70–99)
Potassium: 4 mmol/L (ref 3.5–5.1)
Sodium: 138 mmol/L (ref 135–145)

## 2021-06-24 LAB — RESP PANEL BY RT-PCR (FLU A&B, COVID) ARPGX2
Influenza A by PCR: NEGATIVE
Influenza B by PCR: NEGATIVE
SARS Coronavirus 2 by RT PCR: NEGATIVE

## 2021-06-24 LAB — SARS CORONAVIRUS 2 (TAT 6-24 HRS): SARS Coronavirus 2: NEGATIVE

## 2021-06-24 MED ORDER — ENSURE ENLIVE PO LIQD: 237.0000 mL | Freq: Two times a day (BID) | ORAL | 12 refills | Status: AC

## 2021-06-24 MED ORDER — CYANOCOBALAMIN 1000 MCG PO TABS: 1000.0000 ug | ORAL_TABLET | Freq: Every day | ORAL | 0 refills | Status: AC

## 2021-06-24 NOTE — TOC Progression Note (Signed)
Transition of Care [facility]) - Progression Note    Patient Details  Name: Sarah Moyer MRN: [id number] Date of Birth: 10-02-1930  Transition of [facility]) CM/SW Contact  Boneta Lucks, RN Phone Number: 06/24/2021, 5:16 PM  Clinical Narrative:   DC summary sent to [facility]. Covid test not result 6 hour test ordered. EMS log is behind, Can not get a rapid to schedule [facility] transport before 6pm, Patient really needs EMS.  Plan to DC tomorrow.   Expected Discharge Plan: [place] Barriers to Discharge: Continued Medical Work up  Expected Discharge Plan and Services Expected Discharge Plan: [place]   Discharge Planning Services: CM Consult Post Acute Care Choice: [place] arrangements for the past 2 months: Single Family Home Expected Discharge Date: 06/24/21                   Readmission Risk Interventions No flowsheet data found.

## 2021-06-24 NOTE — Discharge Summary (Signed)
Physician Discharge Summary  Sarah Moyer [id number] DOB: [birth year] DOA: 06/20/2021  PCP: Asencion Noble, MD  Admit date: 06/20/2021  Discharge date: 06/24/2021  Admitted From:Home  Disposition:  SNF  Recommendations for Outpatient Follow-up:  Follow up with PCP in 1-2 weeks Remain on medications as noted below  Home Health: None  Equipment/Devices: None  Discharge Condition:Stable  CODE STATUS: Full  Diet recommendation: Heart Healthy  Brief/Interim Summary: Sarah Moyer is a 87 y.o. F with dementia, home dwelling who presented with confusion episodic in the evening.   Evidently the patient has had a slow decline in her functional status, and then a few days prior to admission was noted to be weaker, "could not hold her head up", and then at night to have some confusion, agitation, so family brought her to the emergency room twice.  Workup was unremarkable the first time and was discharged home, second time she appeared weak and so she was admitted for work-up.   In the ER the second time, she was afebrile, somewhat bradycardic, blood pressure normal, sats normal, WBC normal, chest x-ray clear, UA unremarkable CT head unremarkable.  She was started on fluids and the hospitalist service were asked to evaluate for possible acute metabolic encephalopathy.  -Patient surely had failure to thrive in the setting of advancing dementia.  PT recommendations for placement to SNF.  She does have a mild B12 deficiency and it is recommended for her to continue on oral supplementation.  This will be prescribed for her to continue.  She is stable for discharge to SNF today.  Palliative has had discussion with family members who would like to discuss CODE STATUS further as well as outpatient palliative services in the near future.  She remains a full code for now.  Discharge Diagnoses:  Principal Problem:   Failure to thrive in adult Active Problems:   Essential hypertension, benign    Generalized weakness  Principal discharge diagnosis: Generalized weakness secondary to failure to thrive in the setting of advancing dementia.  Mild B12 deficiency.  Discharge Instructions  Discharge Instructions     Diet - low sodium heart healthy   Complete by: As directed    Increase activity slowly   Complete by: As directed       Allergies as of 06/24/2021       Reactions   Other         Medication List     STOP taking these medications    donepezil 10 MG disintegrating tablet Commonly known as: ARICEPT ODT       TAKE these medications    acetaminophen 325 MG tablet Commonly known as: TYLENOL Take 325 mg by mouth every 6 (six) hours as needed.   aspirin 81 MG chewable tablet Chew 81 mg by mouth daily.   cyanocobalamin 1000 MCG tablet Take 1 tablet (1,000 mcg total) by mouth daily. Start taking on: June 25, 2021   feeding supplement Liqd Take 237 mLs by mouth 2 (two) times daily between meals. Start taking on: June 25, 2021   mirtazapine 15 MG tablet Commonly known as: REMERON Take 15 mg by mouth at bedtime.   PREVAGEN PO Take 1 tablet by mouth daily.   simvastatin 20 MG tablet Commonly known as: ZOCOR Take 20 mg by mouth daily.        Contact information for follow-up providers     Asencion Noble, MD. Schedule an appointment as soon as possible for a visit in 1 week(s).   Specialty:  Internal Medicine Contact information: [address] [place]  [address] [phone]              Contact information for after-discharge care     Destination     [facility] .   Service: Skilled Nursing Contact information: [address] [place] [phone]                    Allergies  Allergen Reactions   Other     Consultations: Palliative   Procedures/Studies: DG Chest 2 View  Result Date: 06/20/2021 CLINICAL DATA:   Generalized weakness EXAM: CHEST - 2 VIEW COMPARISON:  Chest radiograph 02/05/2017 FINDINGS: The heart is mildly enlarged. The mediastinal contours are within normal limits. There is calcified atherosclerotic plaque of the aortic arch. There are patchy opacities with areas of central lucency projecting over the left base, similar in appearance to the prior study. These structures project over the mid thorax on the lateral projection. The left upper lung is clear. The right lung is well aerated. There is no significant pleural effusion. There is no pneumothorax. There is degenerative change of both shoulders, right more than left. There is no acute osseous abnormality. IMPRESSION: 1. 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 The appearance is overall similar to 2018. CT may be considered for better evaluation 2. Mild cardiomegaly. 3. Otherwise, no radiographic evidence of acute cardiopulmonary process. Electronically Signed   By: Valetta Mole M.D.   On: 06/20/2021 15:55   DG Shoulder Right  Result Date: 06/24/2021 CLINICAL DATA:  Constant chronic right shoulder pain EXAM: RIGHT SHOULDER - 2+ VIEW COMPARISON:  06/20/2021 chest x-ray FINDINGS: Bones are osteopenic. Degenerative changes of both the glenohumeral and AC joints. Slightly high-riding humeral head can be seen with chronic rotator cuff injury. No acute osseous finding, fracture or malalignment. Bones are osteopenic. IMPRESSION: Osteopenia and degenerative changes as above. No acute osseous finding or malalignment. Electronically Signed   By: Jerilynn Mages.  Shick M.D.   On: 06/24/2021 11:34   CT HEAD WO CONTRAST (5MM)  Result Date: 06/20/2021 CLINICAL DATA:  Altered mental status EXAM: CT HEAD WITHOUT CONTRAST TECHNIQUE: Contiguous axial images were obtained from the base of the skull through the vertex without intravenous contrast. COMPARISON:  None.  FINDINGS: Brain: There is no evidence of acute intracranial hemorrhage, extra-axial fluid collection, or infarct. There is mild global parenchymal volume loss. There is confluent hypodensity in the subcortical and periventricular white matter likely reflecting sequela of chronic white matter microangiopathy. The ventricles are not Enlarged.  There is no midline shift.  No mass lesion is identified. Vascular: There is calcification of the bilateral cavernous ICAs. Skull: Normal. Negative for fracture or focal lesion. Sinuses/Orbits: The imaged paranasal sinuses are clear. Bilateral lens implants are in place. The globes and orbits are otherwise unremarkable. Other: There is advanced degenerative change of the temporomandibular joints bilaterally. IMPRESSION: 1. No acute intracranial pathology. 2. Global parenchymal volume loss and moderate chronic white matter microangiopathy. Electronically Signed   By: Valetta Mole M.D.   On: 06/20/2021 16:00     Discharge Exam: Vitals:   06/23/21 2133 06/24/21 0543  BP: (!) 148/65 (!) 163/61  Pulse: (!) 59 (!) 59  Resp: 16 18  Temp: 98.8 F (37.1 C) 98.6 F (37 C)  SpO2: 96% 100%   Vitals:   06/23/21  0532 06/23/21 1450 06/23/21 2133 06/24/21 0543  BP: (!) 179/70 (!) 168/72 (!) 148/65 (!) 163/61  Pulse: (!) 57 (!) 56 (!) 59 (!) 59  Resp:  '19 16 18  '$ Temp: 98.2 F (36.8 C) 98.6 F (37 C) 98.8 F (37.1 C) 98.6 F (37 C)  TempSrc: Oral Oral Oral   SpO2: 100% 100% 96% 100%  Weight:      Height:        General: Pt is alert, awake, not in acute distress Cardiovascular: RRR, S1/S2 +, no rubs, no gallops Respiratory: CTA bilaterally, no wheezing, no rhonchi Abdominal: Soft, NT, ND, bowel sounds + Extremities: no edema, no cyanosis    The results of significant diagnostics from this hospitalization (including imaging, microbiology, ancillary and laboratory) are listed below for reference.     Microbiology: Recent Results (from the past 240 hour(s))   Resp Panel by RT-PCR (Flu A&B, Covid) Nasopharyngeal Swab     Status: None   Collection Time: 06/20/21  4:45 PM   Specimen: Nasopharyngeal Swab; Nasopharyngeal(NP) swabs in vial transport medium  Result Value Ref Range Status   SARS Coronavirus 2 by RT PCR NEGATIVE NEGATIVE Final    Comment: (NOTE) SARS-CoV-2 target nucleic acids are NOT DETECTED.  The SARS-CoV-2 RNA is generally detectable in upper respiratory specimens during the acute phase of infection. The lowest concentration of SARS-CoV-2 viral copies this assay can detect is 138 copies/mL. A negative result does not preclude SARS-Cov-2 infection and should not be used as the sole basis for treatment or other patient management decisions. A negative result may occur with  improper specimen collection/handling, submission of specimen other than nasopharyngeal swab, presence of viral mutation(s) within the areas targeted by this assay, and inadequate number of viral copies(<138 copies/mL). A negative result must be combined with clinical observations, patient history, and epidemiological information. The expected result is Negative.  Fact Sheet for Patients:  EntrepreneurPulse.com.au  Fact Sheet for Healthcare Providers:  IncredibleEmployment.be  This test is no t yet approved or cleared by the [place] FDA and  has been authorized for detection and/or diagnosis of SARS-CoV-2 by FDA under an Emergency Use Authorization (EUA). This EUA will remain  in effect (meaning this test can be used) for the duration of the COVID-19 declaration under Section 564(b)(1) of the Act, 21 U.S.C.section 360bbb-3(b)(1), unless the authorization is terminated  or revoked sooner.       Influenza A by PCR NEGATIVE NEGATIVE Final   Influenza B by PCR NEGATIVE NEGATIVE Final    Comment: (NOTE) The Xpert Xpress SARS-CoV-2/FLU/RSV plus assay is intended as an aid in the diagnosis of influenza from  Nasopharyngeal swab specimens and should not be used as a sole basis for treatment. Nasal washings and aspirates are unacceptable for Xpert Xpress SARS-CoV-2/FLU/RSV testing.  Fact Sheet for Patients: EntrepreneurPulse.com.au  Fact Sheet for Healthcare Providers: IncredibleEmployment.be  This test is not yet approved or cleared by the [place] FDA and has been authorized for detection and/or diagnosis of SARS-CoV-2 by FDA under an Emergency Use Authorization (EUA). This EUA will remain in effect (meaning this test can be used) for the duration of the COVID-19 declaration under Section 564(b)(1) of the Act, 21 U.S.C. section 360bbb-3(b)(1), unless the authorization is terminated or revoked.  Performed at [facility], [address] [place], [place] [address]      Labs: BNP (last 3 results) No results for input(s): BNP in the last 8760 hours. Basic Metabolic Panel: Recent Labs  Lab 06/19/21  1816 06/20/21 1452 06/21/21 0846 06/24/21 0530  NA 139 140 141 138  K 3.6 3.8 4.4 4.0  CL 108 108 110 107  CO2 '23 27 26 25  '$ GLUCOSE 98 75 90 94  BUN '12 11 11 16  '$ CREATININE 0.82 0.92 0.76 0.75  CALCIUM 9.0 9.0 8.7* 9.3   Liver Function Tests: Recent Labs  Lab 06/19/21 1816 06/20/21 1452  AST 15 14*  ALT 12 12  ALKPHOS 67 59  BILITOT 0.7 0.9  PROT 6.4* 6.1*  ALBUMIN 3.8 3.7   No results for input(s): LIPASE, AMYLASE in the last 168 hours. Recent Labs  Lab 06/21/21 0846  AMMONIA 11   CBC: Recent Labs  Lab 06/19/21 1816 06/20/21 1452 06/24/21 0530  WBC 8.1 8.5 11.4*  NEUTROABS  --  5.7  --   HGB 15.0 14.3 14.0  HCT 43.6 42.4 41.0  MCV 92.6 93.4 92.1  PLT 250 233 201   Cardiac Enzymes: No results for input(s): CKTOTAL, CKMB, CKMBINDEX, TROPONINI in the last 168 hours. BNP: Invalid input(s): POCBNP CBG: Recent Labs  Lab 06/20/21 1825  GLUCAP 99   D-Dimer No results for input(s): DDIMER in the last 72 hours. Hgb  A1c No results for input(s): HGBA1C in the last 72 hours. Lipid Profile No results for input(s): CHOL, HDL, LDLCALC, TRIG, CHOLHDL, LDLDIRECT in the last 72 hours. Thyroid function studies No results for input(s): TSH, T4TOTAL, T3FREE, THYROIDAB in the last 72 hours.  Invalid input(s): FREET3 Anemia work up No results for input(s): VITAMINB12, FOLATE, FERRITIN, TIBC, IRON, RETICCTPCT in the last 72 hours. Urinalysis    Component Value Date/Time   COLORURINE STRAW (A) 06/20/2021 1452   APPEARANCEUR CLEAR 06/20/2021 1452   LABSPEC 1.004 (L) 06/20/2021 1452   PHURINE 8.0 06/20/2021 1452   GLUCOSEU NEGATIVE 06/20/2021 1452   HGBUR NEGATIVE 06/20/2021 1452   BILIRUBINUR NEGATIVE 06/20/2021 1452   KETONESUR NEGATIVE 06/20/2021 1452   PROTEINUR NEGATIVE 06/20/2021 1452   UROBILINOGEN 1.0 06/20/2012 1802   NITRITE NEGATIVE 06/20/2021 1452   LEUKOCYTESUR NEGATIVE 06/20/2021 1452   Sepsis Labs Invalid input(s): PROCALCITONIN,  WBC,  LACTICIDVEN Microbiology Recent Results (from the past 240 hour(s))  Resp Panel by RT-PCR (Flu A&B, Covid) Nasopharyngeal Swab     Status: None   Collection Time: 06/20/21  4:45 PM   Specimen: Nasopharyngeal Swab; Nasopharyngeal(NP) swabs in vial transport medium  Result Value Ref Range Status   SARS Coronavirus 2 by RT PCR NEGATIVE NEGATIVE Final    Comment: (NOTE) SARS-CoV-2 target nucleic acids are NOT DETECTED.  The SARS-CoV-2 RNA is generally detectable in upper respiratory specimens during the acute phase of infection. The lowest concentration of SARS-CoV-2 viral copies this assay can detect is 138 copies/mL. A negative result does not preclude SARS-Cov-2 infection and should not be used as the sole basis for treatment or other patient management decisions. A negative result may occur with  improper specimen collection/handling, submission of specimen other than nasopharyngeal swab, presence of viral mutation(s) within the areas targeted by this  assay, and inadequate number of viral copies(<138 copies/mL). A negative result must be combined with clinical observations, patient history, and epidemiological information. The expected result is Negative.  Fact Sheet for Patients:  EntrepreneurPulse.com.au  Fact Sheet for Healthcare Providers:  IncredibleEmployment.be  This test is no t yet approved or cleared by the [place] FDA and  has been authorized for detection and/or diagnosis of SARS-CoV-2 by FDA under an Emergency Use Authorization (EUA). This EUA will remain  in effect (meaning this test can be used) for the duration of the COVID-19 declaration under Section 564(b)(1) of the Act, 21 U.S.C.section 360bbb-3(b)(1), unless the authorization is terminated  or revoked sooner.       Influenza A by PCR NEGATIVE NEGATIVE Final   Influenza B by PCR NEGATIVE NEGATIVE Final    Comment: (NOTE) The Xpert Xpress SARS-CoV-2/FLU/RSV plus assay is intended as an aid in the diagnosis of influenza from Nasopharyngeal swab specimens and should not be used as a sole basis for treatment. Nasal washings and aspirates are unacceptable for Xpert Xpress SARS-CoV-2/FLU/RSV testing.  Fact Sheet for Patients: EntrepreneurPulse.com.au  Fact Sheet for Healthcare Providers: IncredibleEmployment.be  This test is not yet approved or cleared by the [place] FDA and has been authorized for detection and/or diagnosis of SARS-CoV-2 by FDA under an Emergency Use Authorization (EUA). This EUA will remain in effect (meaning this test can be used) for the duration of the COVID-19 declaration under Section 564(b)(1) of the Act, 21 U.S.C. section 360bbb-3(b)(1), unless the authorization is terminated or revoked.  Performed at [facility], [address] [place], [place] [address]      Time coordinating discharge: 35 minutes  SIGNED:   Rodena Goldmann, DO [facility] 06/24/2021, 3:38 PM  If 7PM-7AM, please contact night-coverage [email or web address]

## 2021-06-24 NOTE — Consult Note (Signed)
Consultation Note Date: 06/24/2021   Patient Name: Sarah Moyer  DOB: [birth year]  MRN: [id number]  Age / Sex: 87 y.o., female  PCP: Asencion Noble, MD Referring Physician: Rodena Goldmann, DO  Reason for Consultation: Establishing goals of care and Psychosocial/spiritual support  HPI/Patient Profile: 87 y.o. female  with past medical history of dementia, left breast cancer with lumpectomy 2015, HTN/HLD, left bundle branch block, diverticular disease, hiatal hernia, osteopenia, DDD, right hip arthroplasty 2018, left hip 2013 admitted on 06/20/2021 with failure to thrive.   Clinical Assessment and Goals of Care: I have reviewed medical records including EPIC notes, labs and imaging, received report from RN, assessed the patient.  Mrs. Posten is resting quietly in bed.  She will open her eyes and briefly make but not keep eye contact when I call her name.  She has known dementia but is able to tell me her name and that we are in the hospital.  I am not sure that she can make her basic needs known.  Her son, Lanny Hurst is at bedside.  As Lanny Hurst and I are talking, Mr. Eveland arrives.    We meet to discuss diagnosis prognosis, GOC, EOL wishes, disposition and options.   I introduced Palliative Medicine as specialized medical care for people living with serious illness. It focuses on providing relief from the symptoms and stress of a serious illness. The goal is to improve quality of life for both the patient and the family.  We discussed a brief life review of the patient.  Mrs. Colavita has been married to [facility] for 71 years.  They have a son Lanny Hurst and a daughter Jeannene Patella.  Family shares that Mrs. Loughnane is independent with bathing and dressing, and is able to do so without prompting.  They share that she has some mobility issues, has a rolling walker at home, but holds onto walls and furniture as she walks.  She used to cook but  longer does so.  Family shares that they have help provide meals.  Mr. Vickery manages the finances for the home and goes out to get groceries.  They have Meals on Wheels.  Lanny Hurst shares that Mrs. Kniss sister lives next door and Mrs. Carol's daughters also live very close.  Her sister is 53, but has great mobility and helps daily with meals.   We then focused on their current illness.  Lanny Hurst shares that Mrs. Soulsby has been experiencing memory loss for a couple of years now.  Family shares that Mrs. Hedstrom has experienced a decline over the last few months, this last week in particular.  They share that she has never been a big eater, but has been eating less.  They share that she has experienced weight loss.  They share that she is not drinking enough water, although she perceives that she is.   We talked about the progressive nature of memory loss, what is normal and expected.  We talked about the 3 main changes including mental status changes, physical/functional changes, and nutritional/intake  changes.  I share that, at times, family elect to put a feeding tube in their loved one who is not eating well.  I shared that this is not recommended as it does not change any of her other health concerns or her memory loss.  Family states that they do not believe that Mrs. Zoltowski would want a tube to artificially feed her.  The natural disease trajectory and expectations at EOL were discussed.  We talked about disposition to short-term rehab.  Family states that Mr. Sagona is unable to care for his wife at home because of her weakness.  I encourage family to be mindful of illness and injury when a person who has memory loss is placed in short-term rehab.  Again, we talked about what is normal and expected.  Advanced directives, concepts specific to code status, artifical feeding and hydration, and rehospitalization were considered and discussed.  Family states that Mrs. Arbie Cookey has an "living will".  We talked  about the concept of "treat the treatable but allowing natural death".   Family states that they would like time to talk about these choices.  Palliative Care services outpatient were explained and offered.  Family is considering outpatient palliative services.  Discussed the importance of continued conversation with family and the medical providers regarding overall plan of care and treatment options, ensuring decisions are within the context of the patient's values and GOCs.  Questions and concerns were addressed.  Hard Choices booklet left for review. The family was encouraged to call with questions or concerns.  PMT will continue to support holistically.  Friends with attending, bedside nursing staff, transition of care team related to patient condition, needs, goals of care, disposition.   HCPOA   NEXT OF KIN -husband 61 years, Harle Stanford, with the support of their 2 children, Lanny Hurst and Rainelle   At this point continue to treat the treatable States she has an "living will", considering DNR status Short-term rehab   Code Status/Advance Care Planning: Full code -spouse forced and son Lanny Hurst, at bedside, share that Mrs. Arbie Cookey has an "living will".  We talked about the concept of "treat the treatable, but allowing natural death".  Family to consider CODE STATUS.  Symptom Management:  Per hospitalist, no additional needs at this time.  Palliative Prophylaxis:  Frequent Pain Assessment and Oral Care  Additional Recommendations (Limitations, Scope, Preferences): Full Scope Treatment  Psycho-social/Spiritual:  Desire for further Chaplaincy support:no Additional Recommendations: Caregiving  Support/Resources and Education on Hospice  Prognosis:  Unable to determine, based on outcomes.  6 months or less would not be surprising based on declining functional status, weight loss, chronic illness burden.  Discharge Planning:  Short-term rehab, considering  outpatient palliative services.       Primary Diagnoses: Present on Admission:  Essential hypertension, benign  Failure to thrive in adult   I have reviewed the medical record, interviewed the patient and family, and examined the patient. The following aspects are pertinent.  Past Medical History:  Diagnosis Date   Arrhythmia    left BBB,    PVC'S   Cancer ([place]) 10/2013   LEFT BREAST CANCER--LUMPECTOMY   DDD (degenerative disc disease)    Diverticular disease    With colonic polyps   Hiatal hernia    Hyperlipidemia    Hypertension     no meds    dr Radene Gunning   Osteopenia    Social History   Socioeconomic History   Marital status:  Married    Spouse name: Not on file   Number of children: 2   Years of education: Not on file   Highest education level: Not on file  Occupational History   Not on file  Tobacco Use   Smoking status: Never   Smokeless tobacco: Never  Vaping Use   Vaping Use: Never used  Substance and Sexual Activity   Alcohol use: No   Drug use: No   Sexual activity: Never  Other Topics Concern   Not on file  Social History Narrative   Lives with husband.     Social Determinants of Health   Financial Resource Strain: Not on file  Food Insecurity: Not on file  Transportation Needs: Not on file  Physical Activity: Not on file  Stress: Not on file  Social Connections: Not on file   Family History  Problem Relation Age of Onset   Heart Problems Mother        Died age 63, no details/Died age 44, no details   CAD Brother 43   CAD Brother 18   Scheduled Meds:  aspirin  81 mg Oral Daily   enoxaparin (LOVENOX) injection  40 mg Subcutaneous Q24H   feeding supplement  237 mL Oral BID BM   mirtazapine  15 mg Oral QHS   vitamin B-12  1,000 mcg Oral Daily   Continuous Infusions: PRN Meds:.acetaminophen **OR** acetaminophen, hydrALAZINE, ondansetron **OR** ondansetron (ZOFRAN) IV Medications Prior to Admission:  Prior to Admission medications    Medication Sig Start Date End Date Taking? Authorizing Provider  acetaminophen (TYLENOL) 325 MG tablet Take 325 mg by mouth every 6 (six) hours as needed.   Yes [provider]  Apoaequorin (PREVAGEN PO) Take 1 tablet by mouth daily.   Yes [provider]  aspirin 81 MG chewable tablet Chew 81 mg by mouth daily.   Yes [provider]  donepezil (ARICEPT ODT) 10 MG disintegrating tablet Take 10 mg by mouth daily.   Yes [provider]  mirtazapine (REMERON) 15 MG tablet Take 15 mg by mouth at bedtime. 03/07/20  Yes [provider]  simvastatin (ZOCOR) 20 MG tablet Take 20 mg by mouth daily.    Yes [provider]   Allergies  Allergen Reactions   Other    Review of Systems  Unable to perform ROS: Dementia   Physical Exam Vitals and nursing note reviewed.  Constitutional:      General: She is not in acute distress.    Appearance: She is ill-appearing.  HENT:     Mouth/Throat:     Mouth: Mucous membranes are dry.  Cardiovascular:     Rate and Rhythm: Normal rate.  Pulmonary:     Effort: Pulmonary effort is normal. No respiratory distress.  Abdominal:     General: Abdomen is flat.  Skin:    General: Skin is warm and dry.  Neurological:     Comments: Able to tell me her name and that we are in the hospital, known dementia  Psychiatric:     Comments: Calm and cooperative, not fearful    Vital Signs: BP (!) 163/61 (BP Location: Right Arm)   Pulse (!) 59   Temp 98.6 F (37 C)   Resp 18   Ht '5\' 4"'$  (1.626 m)   Wt 53.1 kg   SpO2 100%   BMI 20.08 kg/m  Pain Scale: PAINAD   Pain Score: Asleep   SpO2: SpO2: 100 % O2 Device:SpO2: 100 % O2 Flow  Rate: .O2 Flow Rate (L/min): 0 L/min  IO: Intake/output summary:  Intake/Output Summary (Last 24 hours) at 06/24/2021 1438 Last data filed at 06/23/2021 1801 Gross per 24 hour  Intake 120 ml  Output --  Net 120 ml    LBM: Last BM Date: 06/21/21 Baseline Weight: Weight: 53.1  kg Most recent weight: Weight: 53.1 kg     Palliative Assessment/Data:   Flowsheet Rows    Flowsheet Row Most Recent Value  Intake Tab   Referral Department Hospitalist  Unit at Time of Referral Med/Surg Unit  Palliative Care Primary Diagnosis Neurology  Date Notified 06/24/21  Palliative Care Type New Palliative care  Reason for referral Clarify Goals of Care  Date of Admission 06/20/21  Date first seen by Palliative Care 06/24/21  # of days Palliative referral response time 0 Day(s)  # of days IP prior to Palliative referral 4  Clinical Assessment   Palliative Performance Scale Score 30%  Pain Max last 24 hours Not able to report  Pain Min Last 24 hours Not able to report  Dyspnea Max Last 24 Hours Not able to report  Dyspnea Min Last 24 hours Not able to report  Psychosocial & Spiritual Assessment   Palliative Care Outcomes        Time In: 1040 Time Out: 1150 Time Total: 70 minutes  Greater than 50%  of this time was spent counseling and coordinating care related to the above assessment and plan.  Signed by: Drue Novel, NP   Please contact Palliative Medicine Team phone at [phone] for questions and concerns.  For individual provider: See Shea Evans

## 2021-06-24 NOTE — Progress Notes (Signed)
Physical Therapy Treatment Patient Details Name: Sarah Moyer MRN: [id number] DOB: Dec 20, 1929 Today's Date: 06/24/2021    History of Present Illness Sarah Moyer is a 87 y.o. female with medical history significant for dementia, breast cancer, hypertension.  Patient was brought to the ED reports of generalized weakness that started 4 days ago on the 21st.  Daughter reports patient's donepezil was started 2 days prior, and she was taking it in the morning ( as opposed to afternoon/evening in the past).  No confusion was noted, no facial asymmetry, she is able to move all extremities, but she is too weak to get up or out bed.  Patient reported her head felt too heavy for her.  Patient has chronic poor oral intake, over the past 2 days patient has not eaten or drank anything.  No vomiting no loose stools no cough or difficulty breathing no chest pain.    PT Comments    Patient agreeable to participating in therapy session today and getting out of bed to chair. Son and husband present throughout session. Patient's right shoulder quite painful today. Son reports may be from B12 shots received earlier in the week. Right xray performed with no acute findings. Patient performed all mobility requiring assistance. Upon initial standing patient was rather unbalance leaning posteriorly requiring assistance to not fall. Patient was able to maintain standing balance after acclimating to standing position and use of RW for support. Verbal and gestural cues required during bed mobility, transfers and ambulation for use of bed rails and RW. Patient limited by pain and fatigue. Patient in chair with legs elevated at end of session.  Patient would continue to benefit from skilled physical therapy in current environment and next venue to continue return to prior function and increase strength, endurance, balance, coordination, and functional mobility and gait skills.     Follow Up Recommendations  SNF      Equipment Recommendations  None recommended by PT    Recommendations for Other Services       Precautions / Restrictions Precautions Precautions: Fall Restrictions Weight Bearing Restrictions: No    Mobility  Bed Mobility Overal bed mobility: Needs Assistance Bed Mobility: Supine to Sit     Supine to sit: Min assist     General bed mobility comments: slow labored movement    Transfers Overall transfer level: Needs assistance Equipment used: Rolling walker (2 wheeled) Transfers: Sit to/from [facility] Sit to Stand: Min assist Stand pivot transfers: Min assist       General transfer comment: slow labored movement, cues for sequencing of steps and placement of hands  Ambulation/Gait Ambulation/Gait assistance: Min assist Gait Distance (Feet): 4 Feet Assistive device: Rolling walker (2 wheeled) Gait Pattern/deviations: Step-to pattern;Decreased step length - right;Decreased step length - left;Decreased stride length;Trunk flexed;Narrow base of support Gait velocity: decreased   General Gait Details: slow, labored cadence with RW, assistance to move RW limited to a few steps along side of bed over to chair, painful right shoulder with use and movement, limited by fatigue; on room air   Stairs             Wheelchair Mobility    Modified Rankin (Stroke Patients Only)       Balance Overall balance assessment: Needs assistance Sitting-balance support: Feet supported;No upper extremity supported Sitting balance-Leahy Scale: Fair Sitting balance - Comments: fair to good seated at EOB   Standing balance support: Bilateral upper extremity supported;During functional activity Standing balance-Leahy Scale: Poor Standing balance  comment: fair/poor using RW      Cognition Arousal/Alertness: Awake/alert Behavior During Therapy: WFL for tasks assessed/performed Overall Cognitive Status: History of cognitive impairments - at baseline         Exercises      General Comments        Pertinent Vitals/Pain Pain Assessment: Faces Faces Pain Scale: Hurts even more Pain Location: R shoulder with movement; xray clear of acute injury Pain Intervention(s): Limited activity within patient's tolerance;Monitored during session;Repositioned    Home Living                      Prior Function            PT Goals (current goals can now be found in the care plan section) Acute Rehab PT Goals Patient Stated Goal: return home PT Goal Formulation: With patient/family Time For Goal Achievement: 07/05/21 Potential to Achieve Goals: Fair Progress towards PT goals: Progressing toward goals    Frequency    Min 3X/week      PT Plan Current plan remains appropriate       AM-PAC PT "6 Clicks" Mobility   Outcome Measure  Help needed turning from your back to your side while in a flat bed without using bedrails?: A Little Help needed moving from lying on your back to sitting on the side of a flat bed without using bedrails?: A Little Help needed moving to and from a bed to a chair (including a wheelchair)?: A Lot Help needed standing up from a chair using your arms (e.g., wheelchair or bedside chair)?: A Lot Help needed to walk in hospital room?: A Lot Help needed climbing 3-5 steps with a railing? : Total 6 Click Score: 13    End of Session Equipment Utilized During Treatment: Gait belt Activity Tolerance: Patient tolerated treatment well Patient left: in chair;with call bell/phone within reach;with family/visitor present Nurse Communication: Mobility status PT Visit Diagnosis: Unsteadiness on feet (R26.81);Other abnormalities of gait and mobility (R26.89);Muscle weakness (generalized) (M62.81)     Time: [id number] PT Time Calculation (min) (ACUTE ONLY): 25 min  Charges:  $Therapeutic Activity: 23-37 mins                     Sarah Raveling. Hartnett-Rands, MS, PT Per [place] [phone]  Sarah Moyer   Moyer 06/24/2021, 1:02 PM

## 2021-06-25 DIAGNOSIS — M6281 Muscle weakness (generalized): Secondary | ICD-10-CM | POA: Diagnosis not present

## 2021-06-25 DIAGNOSIS — Z853 Personal history of malignant neoplasm of breast: Secondary | ICD-10-CM | POA: Diagnosis not present

## 2021-06-25 DIAGNOSIS — R627 Adult failure to thrive: Secondary | ICD-10-CM | POA: Diagnosis not present

## 2021-06-25 DIAGNOSIS — R2689 Other abnormalities of gait and mobility: Secondary | ICD-10-CM | POA: Diagnosis not present

## 2021-06-25 DIAGNOSIS — Z515 Encounter for palliative care: Secondary | ICD-10-CM | POA: Diagnosis not present

## 2021-06-25 DIAGNOSIS — R630 Anorexia: Secondary | ICD-10-CM | POA: Diagnosis not present

## 2021-06-25 DIAGNOSIS — Z96643 Presence of artificial hip joint, bilateral: Secondary | ICD-10-CM | POA: Diagnosis not present

## 2021-06-25 DIAGNOSIS — Z7982 Long term (current) use of aspirin: Secondary | ICD-10-CM | POA: Diagnosis not present

## 2021-06-25 DIAGNOSIS — Z7189 Other specified counseling: Secondary | ICD-10-CM | POA: Diagnosis not present

## 2021-06-25 DIAGNOSIS — I10 Essential (primary) hypertension: Secondary | ICD-10-CM | POA: Diagnosis not present

## 2021-06-25 DIAGNOSIS — M25511 Pain in right shoulder: Secondary | ICD-10-CM | POA: Diagnosis not present

## 2021-06-25 DIAGNOSIS — R413 Other amnesia: Secondary | ICD-10-CM | POA: Diagnosis not present

## 2021-06-25 DIAGNOSIS — E440 Moderate protein-calorie malnutrition: Secondary | ICD-10-CM | POA: Diagnosis not present

## 2021-06-25 DIAGNOSIS — Z20822 Contact with and (suspected) exposure to covid-19: Secondary | ICD-10-CM | POA: Diagnosis not present

## 2021-06-25 DIAGNOSIS — R41841 Cognitive communication deficit: Secondary | ICD-10-CM | POA: Diagnosis not present

## 2021-06-25 DIAGNOSIS — F0391 Unspecified dementia with behavioral disturbance: Secondary | ICD-10-CM | POA: Diagnosis not present

## 2021-06-25 DIAGNOSIS — R410 Disorientation, unspecified: Secondary | ICD-10-CM | POA: Diagnosis not present

## 2021-06-25 DIAGNOSIS — F0390 Unspecified dementia without behavioral disturbance: Secondary | ICD-10-CM | POA: Diagnosis not present

## 2021-06-25 DIAGNOSIS — Z79899 Other long term (current) drug therapy: Secondary | ICD-10-CM | POA: Diagnosis not present

## 2021-06-25 DIAGNOSIS — R531 Weakness: Secondary | ICD-10-CM | POA: Diagnosis not present

## 2021-06-25 NOTE — TOC Transition Note (Signed)
Transition of Care [facility]) - CM/SW Discharge Note   Patient Details  Name: JOVANI ENTWISTLE MRN: [id number] Date of Birth: [birth year]  Transition of Care [facility]) CM/SW Contact:  Boneta Lucks, RN Phone Number: 06/25/2021, 9:12 AM   Clinical Narrative:   [facility] is ready for patient, COVID neg, RN to call report. TOC printed med necessity and will schedule EMS when RN is ready.   Referral made to [facility] for outpatient palliative. Colletta Maryland and Mardene Celeste updated with referral.  Final next level of care: Skilled Nursing Facility Barriers to Discharge: Barriers Resolved   Patient Goals and CMS Choice Patient states their goals for this hospitalization and ongoing recovery are:: agreeable to SNF CMS Medicare.gov Compare Post Acute Care list provided to:: Patient Choice offered to / list presented to : Patient, Adult Children, Spouse  Discharge Placement              Patient chooses bed at:  [facility]) Patient to be transferred to facility by: EMS   Patient and family notified of of transfer: 06/25/21  Discharge Plan and Services   Discharge Planning Services: CM Consult Post Acute Care Choice: [place]

## 2021-06-25 NOTE — Progress Notes (Signed)
Palliative:  Mrs. Sarah Moyer is lying quietly in bed.  She appears acutely/chronically ill and very frail.  She does not open her eyes when I gently call her name and touch her hand.  I am not sure about her orientation, she has known dementia.  Her son, Sarah Moyer, is at bedside.  We talked about disposition to short-term rehab today.  I greatly encouraged Sarah Moyer to lean on the Education officer, museum at [facility] for any future needs.  I ask if he has any questions concerning our conversation yesterday, he denies.  No issues or concerns.  Plan:   Continue to treat the treatable.  [facility] for short-term rehab.  States that [name] has a "living will", which they need to review.  Would benefit from outpatient palliative services which they are also considering.  25 minutes  Quinn Axe, NP Palliative Medicine [place]  Team Phone [phone]

## 2021-06-25 NOTE — Progress Notes (Signed)
Patient seen and evaluated this a.m. with no significant concerns or complaints noted this morning.  She continues to have some mild right-sided shoulder pain, but imaging on 8/29 was negative for any acute abnormalities.  Son currently at bedside.  COVID testing has returned negative and she is ready for discharge.  Please refer to discharge summary dictated 8/29 for full details.  Total care time: 15 minutes

## 2021-06-26 DIAGNOSIS — R531 Weakness: Secondary | ICD-10-CM | POA: Diagnosis not present

## 2021-06-26 DIAGNOSIS — R627 Adult failure to thrive: Secondary | ICD-10-CM | POA: Diagnosis not present

## 2021-06-26 DIAGNOSIS — I10 Essential (primary) hypertension: Secondary | ICD-10-CM | POA: Diagnosis not present

## 2021-06-26 DIAGNOSIS — E440 Moderate protein-calorie malnutrition: Secondary | ICD-10-CM | POA: Diagnosis not present

## 2021-06-26 DIAGNOSIS — R630 Anorexia: Secondary | ICD-10-CM | POA: Diagnosis not present

## 2021-06-27 ENCOUNTER — Other Ambulatory Visit: Payer: Self-pay

## 2021-06-27 ENCOUNTER — Ambulatory Visit (HOSPITAL_COMMUNITY)
Admission: RE | Admit: 2021-06-27 | Discharge: 2021-06-27 | Disposition: A | Discharge status: Home / Self Care | Payer: Medicare HMO | Source: Ambulatory Visit | Attending: Internal Medicine | Admitting: Internal Medicine

## 2021-06-27 DIAGNOSIS — R410 Disorientation, unspecified: Secondary | ICD-10-CM | POA: Diagnosis not present

## 2021-07-23 DIAGNOSIS — M25511 Pain in right shoulder: Secondary | ICD-10-CM | POA: Diagnosis not present

## 2021-08-01 DIAGNOSIS — G464 Cerebellar stroke syndrome: Secondary | ICD-10-CM | POA: Diagnosis not present

## 2021-08-01 DIAGNOSIS — Z23 Encounter for immunization: Secondary | ICD-10-CM | POA: Diagnosis not present

## 2021-08-01 DIAGNOSIS — F0390 Unspecified dementia without behavioral disturbance: Secondary | ICD-10-CM | POA: Diagnosis not present

## 2021-08-01 DIAGNOSIS — C44629 Squamous cell carcinoma of skin of left upper limb, including shoulder: Secondary | ICD-10-CM | POA: Diagnosis not present

## 2021-08-01 DIAGNOSIS — E033 Postinfectious hypothyroidism: Secondary | ICD-10-CM | POA: Diagnosis not present

## 2021-08-02 DIAGNOSIS — F0390 Unspecified dementia without behavioral disturbance: Secondary | ICD-10-CM | POA: Diagnosis not present

## 2021-08-02 DIAGNOSIS — Z515 Encounter for palliative care: Secondary | ICD-10-CM | POA: Diagnosis not present

## 2021-08-05 DIAGNOSIS — M810 Age-related osteoporosis without current pathological fracture: Secondary | ICD-10-CM | POA: Diagnosis not present

## 2021-08-05 DIAGNOSIS — R627 Adult failure to thrive: Secondary | ICD-10-CM | POA: Diagnosis not present

## 2021-08-05 DIAGNOSIS — E785 Hyperlipidemia, unspecified: Secondary | ICD-10-CM | POA: Diagnosis not present

## 2021-08-05 DIAGNOSIS — R630 Anorexia: Secondary | ICD-10-CM | POA: Diagnosis not present

## 2021-08-05 DIAGNOSIS — E538 Deficiency of other specified B group vitamins: Secondary | ICD-10-CM | POA: Diagnosis not present

## 2021-08-05 DIAGNOSIS — I10 Essential (primary) hypertension: Secondary | ICD-10-CM | POA: Diagnosis not present

## 2021-08-05 DIAGNOSIS — F03918 Unspecified dementia, unspecified severity, with other behavioral disturbance: Secondary | ICD-10-CM | POA: Diagnosis not present

## 2021-08-05 DIAGNOSIS — E440 Moderate protein-calorie malnutrition: Secondary | ICD-10-CM | POA: Diagnosis not present

## 2021-08-05 DIAGNOSIS — I499 Cardiac arrhythmia, unspecified: Secondary | ICD-10-CM | POA: Diagnosis not present

## 2021-08-06 DIAGNOSIS — F03918 Unspecified dementia, unspecified severity, with other behavioral disturbance: Secondary | ICD-10-CM | POA: Diagnosis not present

## 2021-08-06 DIAGNOSIS — E538 Deficiency of other specified B group vitamins: Secondary | ICD-10-CM | POA: Diagnosis not present

## 2021-08-06 DIAGNOSIS — E785 Hyperlipidemia, unspecified: Secondary | ICD-10-CM | POA: Diagnosis not present

## 2021-08-06 DIAGNOSIS — I10 Essential (primary) hypertension: Secondary | ICD-10-CM | POA: Diagnosis not present

## 2021-08-06 DIAGNOSIS — I499 Cardiac arrhythmia, unspecified: Secondary | ICD-10-CM | POA: Diagnosis not present

## 2021-08-06 DIAGNOSIS — R630 Anorexia: Secondary | ICD-10-CM | POA: Diagnosis not present

## 2021-08-06 DIAGNOSIS — R627 Adult failure to thrive: Secondary | ICD-10-CM | POA: Diagnosis not present

## 2021-08-06 DIAGNOSIS — M810 Age-related osteoporosis without current pathological fracture: Secondary | ICD-10-CM | POA: Diagnosis not present

## 2021-08-06 DIAGNOSIS — E440 Moderate protein-calorie malnutrition: Secondary | ICD-10-CM | POA: Diagnosis not present

## 2021-08-13 DIAGNOSIS — E538 Deficiency of other specified B group vitamins: Secondary | ICD-10-CM | POA: Diagnosis not present

## 2021-08-13 DIAGNOSIS — R630 Anorexia: Secondary | ICD-10-CM | POA: Diagnosis not present

## 2021-08-13 DIAGNOSIS — M810 Age-related osteoporosis without current pathological fracture: Secondary | ICD-10-CM | POA: Diagnosis not present

## 2021-08-13 DIAGNOSIS — I10 Essential (primary) hypertension: Secondary | ICD-10-CM | POA: Diagnosis not present

## 2021-08-13 DIAGNOSIS — E440 Moderate protein-calorie malnutrition: Secondary | ICD-10-CM | POA: Diagnosis not present

## 2021-08-13 DIAGNOSIS — E785 Hyperlipidemia, unspecified: Secondary | ICD-10-CM | POA: Diagnosis not present

## 2021-08-13 DIAGNOSIS — R627 Adult failure to thrive: Secondary | ICD-10-CM | POA: Diagnosis not present

## 2021-08-13 DIAGNOSIS — I499 Cardiac arrhythmia, unspecified: Secondary | ICD-10-CM | POA: Diagnosis not present

## 2021-08-13 DIAGNOSIS — F03918 Unspecified dementia, unspecified severity, with other behavioral disturbance: Secondary | ICD-10-CM | POA: Diagnosis not present

## 2021-08-14 DIAGNOSIS — F03918 Unspecified dementia, unspecified severity, with other behavioral disturbance: Secondary | ICD-10-CM | POA: Diagnosis not present

## 2021-08-14 DIAGNOSIS — E440 Moderate protein-calorie malnutrition: Secondary | ICD-10-CM | POA: Diagnosis not present

## 2021-08-14 DIAGNOSIS — R630 Anorexia: Secondary | ICD-10-CM | POA: Diagnosis not present

## 2021-08-14 DIAGNOSIS — E538 Deficiency of other specified B group vitamins: Secondary | ICD-10-CM | POA: Diagnosis not present

## 2021-08-14 DIAGNOSIS — E785 Hyperlipidemia, unspecified: Secondary | ICD-10-CM | POA: Diagnosis not present

## 2021-08-14 DIAGNOSIS — M810 Age-related osteoporosis without current pathological fracture: Secondary | ICD-10-CM | POA: Diagnosis not present

## 2021-08-14 DIAGNOSIS — I10 Essential (primary) hypertension: Secondary | ICD-10-CM | POA: Diagnosis not present

## 2021-08-14 DIAGNOSIS — I499 Cardiac arrhythmia, unspecified: Secondary | ICD-10-CM | POA: Diagnosis not present

## 2021-08-14 DIAGNOSIS — R627 Adult failure to thrive: Secondary | ICD-10-CM | POA: Diagnosis not present

## 2021-08-20 DIAGNOSIS — I499 Cardiac arrhythmia, unspecified: Secondary | ICD-10-CM | POA: Diagnosis not present

## 2021-08-20 DIAGNOSIS — M810 Age-related osteoporosis without current pathological fracture: Secondary | ICD-10-CM | POA: Diagnosis not present

## 2021-08-20 DIAGNOSIS — R630 Anorexia: Secondary | ICD-10-CM | POA: Diagnosis not present

## 2021-08-20 DIAGNOSIS — I10 Essential (primary) hypertension: Secondary | ICD-10-CM | POA: Diagnosis not present

## 2021-08-20 DIAGNOSIS — F03918 Unspecified dementia, unspecified severity, with other behavioral disturbance: Secondary | ICD-10-CM | POA: Diagnosis not present

## 2021-08-20 DIAGNOSIS — E538 Deficiency of other specified B group vitamins: Secondary | ICD-10-CM | POA: Diagnosis not present

## 2021-08-20 DIAGNOSIS — R627 Adult failure to thrive: Secondary | ICD-10-CM | POA: Diagnosis not present

## 2021-08-20 DIAGNOSIS — E785 Hyperlipidemia, unspecified: Secondary | ICD-10-CM | POA: Diagnosis not present

## 2021-08-20 DIAGNOSIS — E440 Moderate protein-calorie malnutrition: Secondary | ICD-10-CM | POA: Diagnosis not present

## 2021-08-27 DIAGNOSIS — R630 Anorexia: Secondary | ICD-10-CM | POA: Diagnosis not present

## 2021-08-27 DIAGNOSIS — E785 Hyperlipidemia, unspecified: Secondary | ICD-10-CM | POA: Diagnosis not present

## 2021-08-27 DIAGNOSIS — E440 Moderate protein-calorie malnutrition: Secondary | ICD-10-CM | POA: Diagnosis not present

## 2021-08-27 DIAGNOSIS — R627 Adult failure to thrive: Secondary | ICD-10-CM | POA: Diagnosis not present

## 2021-08-27 DIAGNOSIS — F03918 Unspecified dementia, unspecified severity, with other behavioral disturbance: Secondary | ICD-10-CM | POA: Diagnosis not present

## 2021-08-27 DIAGNOSIS — I10 Essential (primary) hypertension: Secondary | ICD-10-CM | POA: Diagnosis not present

## 2021-08-27 DIAGNOSIS — I499 Cardiac arrhythmia, unspecified: Secondary | ICD-10-CM | POA: Diagnosis not present

## 2021-08-27 DIAGNOSIS — M810 Age-related osteoporosis without current pathological fracture: Secondary | ICD-10-CM | POA: Diagnosis not present

## 2021-08-27 DIAGNOSIS — E538 Deficiency of other specified B group vitamins: Secondary | ICD-10-CM | POA: Diagnosis not present

## 2021-08-29 DIAGNOSIS — I10 Essential (primary) hypertension: Secondary | ICD-10-CM | POA: Diagnosis not present

## 2021-08-29 DIAGNOSIS — M810 Age-related osteoporosis without current pathological fracture: Secondary | ICD-10-CM | POA: Diagnosis not present

## 2021-08-29 DIAGNOSIS — E785 Hyperlipidemia, unspecified: Secondary | ICD-10-CM | POA: Diagnosis not present

## 2021-08-29 DIAGNOSIS — R627 Adult failure to thrive: Secondary | ICD-10-CM | POA: Diagnosis not present

## 2021-08-29 DIAGNOSIS — R630 Anorexia: Secondary | ICD-10-CM | POA: Diagnosis not present

## 2021-08-29 DIAGNOSIS — F03918 Unspecified dementia, unspecified severity, with other behavioral disturbance: Secondary | ICD-10-CM | POA: Diagnosis not present

## 2021-08-29 DIAGNOSIS — I499 Cardiac arrhythmia, unspecified: Secondary | ICD-10-CM | POA: Diagnosis not present

## 2021-08-29 DIAGNOSIS — E440 Moderate protein-calorie malnutrition: Secondary | ICD-10-CM | POA: Diagnosis not present

## 2021-08-29 DIAGNOSIS — E538 Deficiency of other specified B group vitamins: Secondary | ICD-10-CM | POA: Diagnosis not present

## 2021-09-02 DIAGNOSIS — C44629 Squamous cell carcinoma of skin of left upper limb, including shoulder: Secondary | ICD-10-CM | POA: Diagnosis not present

## 2021-09-04 DIAGNOSIS — R630 Anorexia: Secondary | ICD-10-CM | POA: Diagnosis not present

## 2021-09-04 DIAGNOSIS — F03918 Unspecified dementia, unspecified severity, with other behavioral disturbance: Secondary | ICD-10-CM | POA: Diagnosis not present

## 2021-09-04 DIAGNOSIS — M810 Age-related osteoporosis without current pathological fracture: Secondary | ICD-10-CM | POA: Diagnosis not present

## 2021-09-04 DIAGNOSIS — I499 Cardiac arrhythmia, unspecified: Secondary | ICD-10-CM | POA: Diagnosis not present

## 2021-09-04 DIAGNOSIS — E538 Deficiency of other specified B group vitamins: Secondary | ICD-10-CM | POA: Diagnosis not present

## 2021-09-04 DIAGNOSIS — E785 Hyperlipidemia, unspecified: Secondary | ICD-10-CM | POA: Diagnosis not present

## 2021-09-04 DIAGNOSIS — R627 Adult failure to thrive: Secondary | ICD-10-CM | POA: Diagnosis not present

## 2021-09-04 DIAGNOSIS — I10 Essential (primary) hypertension: Secondary | ICD-10-CM | POA: Diagnosis not present

## 2021-09-04 DIAGNOSIS — E440 Moderate protein-calorie malnutrition: Secondary | ICD-10-CM | POA: Diagnosis not present

## 2021-09-05 DIAGNOSIS — E440 Moderate protein-calorie malnutrition: Secondary | ICD-10-CM | POA: Diagnosis not present

## 2021-09-05 DIAGNOSIS — R627 Adult failure to thrive: Secondary | ICD-10-CM | POA: Diagnosis not present

## 2021-09-05 DIAGNOSIS — E538 Deficiency of other specified B group vitamins: Secondary | ICD-10-CM | POA: Diagnosis not present

## 2021-09-05 DIAGNOSIS — F03918 Unspecified dementia, unspecified severity, with other behavioral disturbance: Secondary | ICD-10-CM | POA: Diagnosis not present

## 2021-09-05 DIAGNOSIS — I499 Cardiac arrhythmia, unspecified: Secondary | ICD-10-CM | POA: Diagnosis not present

## 2021-09-05 DIAGNOSIS — M810 Age-related osteoporosis without current pathological fracture: Secondary | ICD-10-CM | POA: Diagnosis not present

## 2021-09-05 DIAGNOSIS — I10 Essential (primary) hypertension: Secondary | ICD-10-CM | POA: Diagnosis not present

## 2021-09-05 DIAGNOSIS — R630 Anorexia: Secondary | ICD-10-CM | POA: Diagnosis not present

## 2021-09-05 DIAGNOSIS — E785 Hyperlipidemia, unspecified: Secondary | ICD-10-CM | POA: Diagnosis not present

## 2021-09-10 DIAGNOSIS — R630 Anorexia: Secondary | ICD-10-CM | POA: Diagnosis not present

## 2021-09-10 DIAGNOSIS — R627 Adult failure to thrive: Secondary | ICD-10-CM | POA: Diagnosis not present

## 2021-09-10 DIAGNOSIS — E785 Hyperlipidemia, unspecified: Secondary | ICD-10-CM | POA: Diagnosis not present

## 2021-09-10 DIAGNOSIS — E538 Deficiency of other specified B group vitamins: Secondary | ICD-10-CM | POA: Diagnosis not present

## 2021-09-10 DIAGNOSIS — M810 Age-related osteoporosis without current pathological fracture: Secondary | ICD-10-CM | POA: Diagnosis not present

## 2021-09-10 DIAGNOSIS — E440 Moderate protein-calorie malnutrition: Secondary | ICD-10-CM | POA: Diagnosis not present

## 2021-09-10 DIAGNOSIS — I10 Essential (primary) hypertension: Secondary | ICD-10-CM | POA: Diagnosis not present

## 2021-09-10 DIAGNOSIS — I499 Cardiac arrhythmia, unspecified: Secondary | ICD-10-CM | POA: Diagnosis not present

## 2021-09-10 DIAGNOSIS — F03918 Unspecified dementia, unspecified severity, with other behavioral disturbance: Secondary | ICD-10-CM | POA: Diagnosis not present

## 2021-09-20 DIAGNOSIS — R627 Adult failure to thrive: Secondary | ICD-10-CM | POA: Diagnosis not present

## 2021-09-20 DIAGNOSIS — E440 Moderate protein-calorie malnutrition: Secondary | ICD-10-CM | POA: Diagnosis not present

## 2021-09-20 DIAGNOSIS — F03918 Unspecified dementia, unspecified severity, with other behavioral disturbance: Secondary | ICD-10-CM | POA: Diagnosis not present

## 2021-09-20 DIAGNOSIS — E785 Hyperlipidemia, unspecified: Secondary | ICD-10-CM | POA: Diagnosis not present

## 2021-09-20 DIAGNOSIS — R630 Anorexia: Secondary | ICD-10-CM | POA: Diagnosis not present

## 2021-09-20 DIAGNOSIS — E538 Deficiency of other specified B group vitamins: Secondary | ICD-10-CM | POA: Diagnosis not present

## 2021-09-20 DIAGNOSIS — I10 Essential (primary) hypertension: Secondary | ICD-10-CM | POA: Diagnosis not present

## 2021-09-20 DIAGNOSIS — I499 Cardiac arrhythmia, unspecified: Secondary | ICD-10-CM | POA: Diagnosis not present

## 2021-09-20 DIAGNOSIS — M810 Age-related osteoporosis without current pathological fracture: Secondary | ICD-10-CM | POA: Diagnosis not present

## 2021-09-23 DIAGNOSIS — D519 Vitamin B12 deficiency anemia, unspecified: Secondary | ICD-10-CM | POA: Diagnosis not present

## 2021-09-23 DIAGNOSIS — E0500 Thyrotoxicosis with diffuse goiter without thyrotoxic crisis or storm: Secondary | ICD-10-CM | POA: Diagnosis not present

## 2021-09-27 DIAGNOSIS — E440 Moderate protein-calorie malnutrition: Secondary | ICD-10-CM | POA: Diagnosis not present

## 2021-09-27 DIAGNOSIS — R627 Adult failure to thrive: Secondary | ICD-10-CM | POA: Diagnosis not present

## 2021-09-27 DIAGNOSIS — M810 Age-related osteoporosis without current pathological fracture: Secondary | ICD-10-CM | POA: Diagnosis not present

## 2021-09-27 DIAGNOSIS — E538 Deficiency of other specified B group vitamins: Secondary | ICD-10-CM | POA: Diagnosis not present

## 2021-09-27 DIAGNOSIS — I10 Essential (primary) hypertension: Secondary | ICD-10-CM | POA: Diagnosis not present

## 2021-09-27 DIAGNOSIS — E785 Hyperlipidemia, unspecified: Secondary | ICD-10-CM | POA: Diagnosis not present

## 2021-09-27 DIAGNOSIS — I499 Cardiac arrhythmia, unspecified: Secondary | ICD-10-CM | POA: Diagnosis not present

## 2021-09-27 DIAGNOSIS — F03918 Unspecified dementia, unspecified severity, with other behavioral disturbance: Secondary | ICD-10-CM | POA: Diagnosis not present

## 2021-09-27 DIAGNOSIS — R630 Anorexia: Secondary | ICD-10-CM | POA: Diagnosis not present

## 2021-09-30 DIAGNOSIS — D519 Vitamin B12 deficiency anemia, unspecified: Secondary | ICD-10-CM | POA: Diagnosis not present

## 2021-09-30 DIAGNOSIS — Z8673 Personal history of transient ischemic attack (TIA), and cerebral infarction without residual deficits: Secondary | ICD-10-CM | POA: Diagnosis not present

## 2021-09-30 DIAGNOSIS — E0590 Thyrotoxicosis, unspecified without thyrotoxic crisis or storm: Secondary | ICD-10-CM | POA: Diagnosis not present

## 2021-11-04 DIAGNOSIS — Z85828 Personal history of other malignant neoplasm of skin: Secondary | ICD-10-CM | POA: Diagnosis not present

## 2021-11-04 DIAGNOSIS — D0439 Carcinoma in situ of skin of other parts of face: Secondary | ICD-10-CM | POA: Diagnosis not present

## 2021-11-04 DIAGNOSIS — Z08 Encounter for follow-up examination after completed treatment for malignant neoplasm: Secondary | ICD-10-CM | POA: Diagnosis not present

## 2022-01-02 DIAGNOSIS — Z85828 Personal history of other malignant neoplasm of skin: Secondary | ICD-10-CM | POA: Diagnosis not present

## 2022-01-02 DIAGNOSIS — X32XXXD Exposure to sunlight, subsequent encounter: Secondary | ICD-10-CM | POA: Diagnosis not present

## 2022-01-02 DIAGNOSIS — L570 Actinic keratosis: Secondary | ICD-10-CM | POA: Diagnosis not present

## 2022-01-02 DIAGNOSIS — Z08 Encounter for follow-up examination after completed treatment for malignant neoplasm: Secondary | ICD-10-CM | POA: Diagnosis not present

## 2022-01-06 DIAGNOSIS — Z8673 Personal history of transient ischemic attack (TIA), and cerebral infarction without residual deficits: Secondary | ICD-10-CM | POA: Diagnosis not present

## 2022-01-06 DIAGNOSIS — G309 Alzheimer's disease, unspecified: Secondary | ICD-10-CM | POA: Diagnosis not present

## 2022-03-19 ENCOUNTER — Other Ambulatory Visit (HOSPITAL_COMMUNITY): Payer: Self-pay | Admitting: Internal Medicine

## 2022-03-19 DIAGNOSIS — Z1231 Encounter for screening mammogram for malignant neoplasm of breast: Secondary | ICD-10-CM

## 2022-04-14 ENCOUNTER — Ambulatory Visit (HOSPITAL_COMMUNITY)
Admission: RE | Admit: 2022-04-14 | Discharge: 2022-04-14 | Disposition: A | Discharge status: Home / Self Care | Payer: Medicare HMO | Source: Ambulatory Visit | Attending: Internal Medicine | Admitting: Internal Medicine

## 2022-04-14 DIAGNOSIS — Z1231 Encounter for screening mammogram for malignant neoplasm of breast: Secondary | ICD-10-CM | POA: Diagnosis not present

## 2022-04-18 DIAGNOSIS — G309 Alzheimer's disease, unspecified: Secondary | ICD-10-CM | POA: Diagnosis not present

## 2022-04-18 DIAGNOSIS — I951 Orthostatic hypotension: Secondary | ICD-10-CM | POA: Diagnosis not present

## 2022-04-20 ENCOUNTER — Ambulatory Visit
Admission: EM | Admit: 2022-04-20 | Discharge: 2022-04-20 | Disposition: A | Discharge status: Home / Self Care | Payer: Medicare HMO | Attending: Nurse Practitioner | Admitting: Nurse Practitioner

## 2022-04-20 DIAGNOSIS — E785 Hyperlipidemia, unspecified: Secondary | ICD-10-CM | POA: Diagnosis not present

## 2022-04-20 DIAGNOSIS — R42 Dizziness and giddiness: Secondary | ICD-10-CM

## 2022-04-20 DIAGNOSIS — I517 Cardiomegaly: Secondary | ICD-10-CM | POA: Diagnosis not present

## 2022-04-20 DIAGNOSIS — Z20822 Contact with and (suspected) exposure to covid-19: Secondary | ICD-10-CM | POA: Diagnosis not present

## 2022-04-20 DIAGNOSIS — I447 Left bundle-branch block, unspecified: Secondary | ICD-10-CM | POA: Diagnosis not present

## 2022-04-20 DIAGNOSIS — E869 Volume depletion, unspecified: Secondary | ICD-10-CM | POA: Diagnosis not present

## 2022-04-20 DIAGNOSIS — N3000 Acute cystitis without hematuria: Secondary | ICD-10-CM | POA: Diagnosis not present

## 2022-04-20 DIAGNOSIS — I440 Atrioventricular block, first degree: Secondary | ICD-10-CM | POA: Diagnosis not present

## 2022-04-20 DIAGNOSIS — R112 Nausea with vomiting, unspecified: Secondary | ICD-10-CM | POA: Diagnosis not present

## 2022-04-20 DIAGNOSIS — R0989 Other specified symptoms and signs involving the circulatory and respiratory systems: Secondary | ICD-10-CM | POA: Diagnosis not present

## 2022-04-20 DIAGNOSIS — R9431 Abnormal electrocardiogram [ECG] [EKG]: Secondary | ICD-10-CM | POA: Diagnosis not present

## 2022-04-20 LAB — POCT FASTING CBG KUC MANUAL ENTRY: POCT Glucose (KUC): 134 mg/dL — AB (ref 70–99)

## 2022-04-24 DIAGNOSIS — N3000 Acute cystitis without hematuria: Secondary | ICD-10-CM | POA: Diagnosis not present

## 2022-04-24 DIAGNOSIS — N390 Urinary tract infection, site not specified: Secondary | ICD-10-CM | POA: Diagnosis not present

## 2022-04-24 DIAGNOSIS — R42 Dizziness and giddiness: Secondary | ICD-10-CM | POA: Diagnosis not present

## 2022-05-03 ENCOUNTER — Emergency Department (HOSPITAL_BASED_OUTPATIENT_CLINIC_OR_DEPARTMENT_OTHER): Payer: Medicare HMO | Admitting: Radiology

## 2022-05-03 ENCOUNTER — Emergency Department (HOSPITAL_BASED_OUTPATIENT_CLINIC_OR_DEPARTMENT_OTHER)
Admission: EM | Admit: 2022-05-03 | Discharge: 2022-05-04 | Disposition: A | Discharge status: Home / Self Care | Payer: Medicare HMO | Attending: Emergency Medicine | Admitting: Emergency Medicine

## 2022-05-03 ENCOUNTER — Encounter (HOSPITAL_BASED_OUTPATIENT_CLINIC_OR_DEPARTMENT_OTHER): Payer: Self-pay | Admitting: Emergency Medicine

## 2022-05-03 ENCOUNTER — Emergency Department (HOSPITAL_BASED_OUTPATIENT_CLINIC_OR_DEPARTMENT_OTHER): Payer: Medicare HMO

## 2022-05-03 ENCOUNTER — Emergency Department (HOSPITAL_COMMUNITY)
Admission: EM | Admit: 2022-05-03 | Discharge: 2022-05-03 | Disposition: A | Discharge status: Home / Self Care | Payer: Medicare HMO | Source: Home / Self Care

## 2022-05-03 ENCOUNTER — Other Ambulatory Visit: Payer: Self-pay

## 2022-05-03 DIAGNOSIS — S42202A Unspecified fracture of upper end of left humerus, initial encounter for closed fracture: Secondary | ICD-10-CM | POA: Insufficient documentation

## 2022-05-03 DIAGNOSIS — Z853 Personal history of malignant neoplasm of breast: Secondary | ICD-10-CM | POA: Insufficient documentation

## 2022-05-03 DIAGNOSIS — M25552 Pain in left hip: Secondary | ICD-10-CM | POA: Insufficient documentation

## 2022-05-03 DIAGNOSIS — S4992XA Unspecified injury of left shoulder and upper arm, initial encounter: Secondary | ICD-10-CM | POA: Diagnosis present

## 2022-05-03 DIAGNOSIS — S50312A Abrasion of left elbow, initial encounter: Secondary | ICD-10-CM | POA: Insufficient documentation

## 2022-05-03 DIAGNOSIS — I10 Essential (primary) hypertension: Secondary | ICD-10-CM | POA: Diagnosis not present

## 2022-05-03 DIAGNOSIS — S42215A Unspecified nondisplaced fracture of surgical neck of left humerus, initial encounter for closed fracture: Secondary | ICD-10-CM | POA: Diagnosis not present

## 2022-05-03 DIAGNOSIS — W19XXXA Unspecified fall, initial encounter: Secondary | ICD-10-CM | POA: Insufficient documentation

## 2022-05-03 DIAGNOSIS — R519 Headache, unspecified: Secondary | ICD-10-CM | POA: Insufficient documentation

## 2022-05-03 DIAGNOSIS — Z043 Encounter for examination and observation following other accident: Secondary | ICD-10-CM | POA: Diagnosis not present

## 2022-05-03 DIAGNOSIS — M25519 Pain in unspecified shoulder: Secondary | ICD-10-CM | POA: Diagnosis not present

## 2022-05-03 MED ORDER — ACETAMINOPHEN 500 MG PO TABS
1000.0000 mg | ORAL_TABLET | Freq: Once | ORAL | Status: AC
Start: 2022-05-03 — End: 2022-05-03
  Administered 2022-05-03: 1000 mg via ORAL
  Filled 2022-05-03: qty 2

## 2022-05-03 NOTE — ED Triage Notes (Signed)
Pt POV with son and daughter-  Pt with unwitnessed mechanical fall 2000 tonight. Denies blood thinners, denies head trauma, denies LOC.    Pt c/o left shoulder, left arm, left hip pain. Skin tear to left elbow noted at triage, bleeding controlled.

## 2022-05-03 NOTE — ED Provider Notes (Signed)
Emergency Department Provider Note   I have reviewed the triage vital signs and the nursing notes.   HISTORY  Chief Complaint Fall and Shoulder Pain   HPI Sarah Moyer is a 88 y.o. female with past history reviewed below presents to the emergency department for evaluation after fall.  Patient states she was in her room and got her feet tangled and she was turning around and fell.  She is mainly having pain in the left shoulder although is also complaining of pain in the elbow and left hip.  Her son and daughter are at bedside and states the patient has had both hips repaired in the past.  She is not anticoagulated.  She lives at home alone with her husband.  She does not require assistive devices to ambulate such as walker, cane, wheelchair.  No regarding head trauma although based on how she was lying family does not strongly suspect this.    Past Medical History:  Diagnosis Date   Arrhythmia    left BBB,    PVC'S   Cancer ([place]) 10/2013   LEFT BREAST CANCER--LUMPECTOMY   DDD (degenerative disc disease)    Diverticular disease    With colonic polyps   Hiatal hernia    Hyperlipidemia    Hypertension     no meds    dr Radene Gunning   Osteopenia     Review of Systems  Constitutional: No fever/chills Cardiovascular: Denies chest pain. Respiratory: Denies shortness of breath. Gastrointestinal: No abdominal pain. Genitourinary: Negative for dysuria. Musculoskeletal: Negative for back pain. Positive left shoulder, elbow, and left hip pain.  Skin: Skin tear to left elbow. Neurological: Negative for headaches, focal weakness or numbness.   ____________________________________________   PHYSICAL EXAM:  VITAL SIGNS: ED Triage Vitals [05/03/22 2216]  Enc Vitals Group     BP (!) 142/98     Pulse Rate 72     Resp (!) 22     Temp 98.1 F (36.7 C)     Temp src      SpO2 98 %   Constitutional: Alert and oriented. Well appearing and in no acute distress. Eyes: Conjunctivae are  normal. PERRL. Head: Atraumatic. Nose: No congestion/rhinnorhea. Mouth/Throat: Mucous membranes are moist.   Neck: No stridor. No cervical spine tenderness to palpation. Cardiovascular: Normal rate, regular rhythm. Good peripheral circulation. Grossly normal heart sounds.   Respiratory: Normal respiratory effort.  No retractions. Lungs CTAB. Gastrointestinal: Soft and nontender. No distention.  Musculoskeletal: No lower extremity tenderness nor edema. Normal ROM of the bilateral hips and knees. No ankle swelling. Mild anterior tenderness to the left shoulder. No wrist tenderness. Mild posterior left elbow pain.  Neurologic:  Normal speech and language. No gross focal neurologic deficits are appreciated.  Skin:  Skin is warm and dry. No deep laceration but skin tare to the left posterior elbow.   ____________________________________________  RADIOLOGY  No results found.  ____________________________________________   PROCEDURES  Procedure(s) performed:   Procedures   ____________________________________________   INITIAL IMPRESSION / ASSESSMENT AND PLAN / ED COURSE  Pertinent labs & imaging results that were available during my care of the patient were reviewed by me and considered in my medical decision making (see chart for details).   This patient is Presenting for Evaluation of fall, which does require a range of treatment options, and is a complaint that involves a high risk of morbidity and mortality.  The Differential Diagnoses includes subdural hematoma, epidural hematoma, acute concussion, traumatic subarachnoid hemorrhage, cerebral  contusions, etc.    I did obtain Additional Historical Information from son and daughter at bedside.  I decided to review pertinent External Data, and in summary last admit from Aug 2022.   Clinical Laboratory Tests considered but patient's fall appears mechanical and patient is at her mental status baseline per family.  Radiologic  Tests Ordered, included ***. I independently interpreted the images and agree with radiology interpretation.   Social Determinants of Health Risk lives at home with husband but close family support.  Consult complete with  Medical Decision Making: Summary:  Patient presents emergency department with pain after fall.  Most of the pain in the left shoulder and elbow.  Small abrasion to the left elbow requiring some basic wound care but no deep laceration.  Given patient's age and unwitnessed fall do plan for CT imaging of the head and cervical spine along with plain films ordered in triage.  Reevaluation with update and discussion with   Disposition:   ____________________________________________  FINAL CLINICAL IMPRESSION(S) / ED DIAGNOSES  Final diagnoses:  None     NEW OUTPATIENT MEDICATIONS STARTED DURING THIS VISIT:  New Prescriptions   No medications on file    Note:  This document was prepared using Dragon voice recognition software and may include unintentional dictation errors.  Nanda Quinton, MD, [facility] Emergency Medicine

## 2022-05-04 DIAGNOSIS — R519 Headache, unspecified: Secondary | ICD-10-CM | POA: Diagnosis not present

## 2022-05-04 DIAGNOSIS — M542 Cervicalgia: Secondary | ICD-10-CM | POA: Diagnosis not present

## 2022-05-04 DIAGNOSIS — S42202A Unspecified fracture of upper end of left humerus, initial encounter for closed fracture: Secondary | ICD-10-CM | POA: Diagnosis not present

## 2022-05-04 NOTE — Discharge Instructions (Signed)
You were seen in the emergency room today after a fall.  Your CT scans were reassuring but your x-rays did show a small crack/fracture in the left upper arm.  We placed you in a sling for comfort.  He can remove this for bathing or dressing.  You may take Tylenol as needed for pain and apply topical medications as needed.  Please call the orthopedist listed, or one of your choosing, for follow-up in the coming week.

## 2022-05-05 DIAGNOSIS — S42295A Other nondisplaced fracture of upper end of left humerus, initial encounter for closed fracture: Secondary | ICD-10-CM | POA: Diagnosis not present

## 2022-05-12 DIAGNOSIS — S42295D Other nondisplaced fracture of upper end of left humerus, subsequent encounter for fracture with routine healing: Secondary | ICD-10-CM | POA: Diagnosis not present

## 2022-06-11 DIAGNOSIS — S42295D Other nondisplaced fracture of upper end of left humerus, subsequent encounter for fracture with routine healing: Secondary | ICD-10-CM | POA: Diagnosis not present

## 2022-07-04 DIAGNOSIS — G301 Alzheimer's disease with late onset: Secondary | ICD-10-CM | POA: Diagnosis not present

## 2022-07-04 DIAGNOSIS — I951 Orthostatic hypotension: Secondary | ICD-10-CM | POA: Diagnosis not present

## 2022-07-04 DIAGNOSIS — Z79899 Other long term (current) drug therapy: Secondary | ICD-10-CM | POA: Diagnosis not present

## 2022-07-05 DIAGNOSIS — I951 Orthostatic hypotension: Secondary | ICD-10-CM | POA: Diagnosis not present

## 2022-07-05 DIAGNOSIS — G309 Alzheimer's disease, unspecified: Secondary | ICD-10-CM | POA: Diagnosis not present

## 2022-07-05 DIAGNOSIS — F0280 Dementia in other diseases classified elsewhere without behavioral disturbance: Secondary | ICD-10-CM | POA: Diagnosis not present

## 2022-07-05 DIAGNOSIS — M80022D Age-related osteoporosis with current pathological fracture, left humerus, subsequent encounter for fracture with routine healing: Secondary | ICD-10-CM | POA: Diagnosis not present

## 2022-07-09 DIAGNOSIS — S42295D Other nondisplaced fracture of upper end of left humerus, subsequent encounter for fracture with routine healing: Secondary | ICD-10-CM | POA: Diagnosis not present

## 2022-07-11 DIAGNOSIS — Z23 Encounter for immunization: Secondary | ICD-10-CM | POA: Diagnosis not present

## 2022-07-11 DIAGNOSIS — G309 Alzheimer's disease, unspecified: Secondary | ICD-10-CM | POA: Diagnosis not present

## 2022-07-11 DIAGNOSIS — E785 Hyperlipidemia, unspecified: Secondary | ICD-10-CM | POA: Diagnosis not present

## 2022-08-06 DIAGNOSIS — S42295D Other nondisplaced fracture of upper end of left humerus, subsequent encounter for fracture with routine healing: Secondary | ICD-10-CM | POA: Diagnosis not present

## 2022-08-13 DIAGNOSIS — H903 Sensorineural hearing loss, bilateral: Secondary | ICD-10-CM | POA: Diagnosis not present

## 2022-08-13 DIAGNOSIS — H6122 Impacted cerumen, left ear: Secondary | ICD-10-CM | POA: Diagnosis not present

## 2022-10-15 DIAGNOSIS — G309 Alzheimer's disease, unspecified: Secondary | ICD-10-CM | POA: Diagnosis not present

## 2022-11-27 DIAGNOSIS — C44612 Basal cell carcinoma of skin of right upper limb, including shoulder: Secondary | ICD-10-CM | POA: Diagnosis not present

## 2022-11-27 DIAGNOSIS — X32XXXD Exposure to sunlight, subsequent encounter: Secondary | ICD-10-CM | POA: Diagnosis not present

## 2022-11-27 DIAGNOSIS — Z08 Encounter for follow-up examination after completed treatment for malignant neoplasm: Secondary | ICD-10-CM | POA: Diagnosis not present

## 2022-11-27 DIAGNOSIS — D0461 Carcinoma in situ of skin of right upper limb, including shoulder: Secondary | ICD-10-CM | POA: Diagnosis not present

## 2022-11-27 DIAGNOSIS — Z85828 Personal history of other malignant neoplasm of skin: Secondary | ICD-10-CM | POA: Diagnosis not present

## 2022-11-27 DIAGNOSIS — L570 Actinic keratosis: Secondary | ICD-10-CM | POA: Diagnosis not present

## 2023-01-16 DIAGNOSIS — R0602 Shortness of breath: Secondary | ICD-10-CM | POA: Diagnosis not present

## 2023-01-16 DIAGNOSIS — G309 Alzheimer's disease, unspecified: Secondary | ICD-10-CM | POA: Diagnosis not present

## 2023-01-19 DIAGNOSIS — D485 Neoplasm of uncertain behavior of skin: Secondary | ICD-10-CM | POA: Diagnosis not present

## 2023-01-19 DIAGNOSIS — Z08 Encounter for follow-up examination after completed treatment for malignant neoplasm: Secondary | ICD-10-CM | POA: Diagnosis not present

## 2023-01-19 DIAGNOSIS — Z85828 Personal history of other malignant neoplasm of skin: Secondary | ICD-10-CM | POA: Diagnosis not present

## 2023-01-19 DIAGNOSIS — L814 Other melanin hyperpigmentation: Secondary | ICD-10-CM | POA: Diagnosis not present

## 2023-01-19 DIAGNOSIS — C44729 Squamous cell carcinoma of skin of left lower limb, including hip: Secondary | ICD-10-CM | POA: Diagnosis not present

## 2023-01-19 DIAGNOSIS — L570 Actinic keratosis: Secondary | ICD-10-CM | POA: Diagnosis not present

## 2023-01-19 DIAGNOSIS — X32XXXD Exposure to sunlight, subsequent encounter: Secondary | ICD-10-CM | POA: Diagnosis not present

## 2023-04-13 DIAGNOSIS — X32XXXD Exposure to sunlight, subsequent encounter: Secondary | ICD-10-CM | POA: Diagnosis not present

## 2023-04-13 DIAGNOSIS — Z08 Encounter for follow-up examination after completed treatment for malignant neoplasm: Secondary | ICD-10-CM | POA: Diagnosis not present

## 2023-04-13 DIAGNOSIS — Z85828 Personal history of other malignant neoplasm of skin: Secondary | ICD-10-CM | POA: Diagnosis not present

## 2023-04-13 DIAGNOSIS — L570 Actinic keratosis: Secondary | ICD-10-CM | POA: Diagnosis not present

## 2023-07-08 DIAGNOSIS — Z79899 Other long term (current) drug therapy: Secondary | ICD-10-CM | POA: Diagnosis not present

## 2023-07-08 DIAGNOSIS — R42 Dizziness and giddiness: Secondary | ICD-10-CM | POA: Diagnosis not present

## 2023-07-08 DIAGNOSIS — G300 Alzheimer's disease with early onset: Secondary | ICD-10-CM | POA: Diagnosis not present

## 2023-07-08 DIAGNOSIS — R0602 Shortness of breath: Secondary | ICD-10-CM | POA: Diagnosis not present

## 2023-07-27 DIAGNOSIS — G309 Alzheimer's disease, unspecified: Secondary | ICD-10-CM | POA: Diagnosis not present

## 2023-07-27 DIAGNOSIS — Z23 Encounter for immunization: Secondary | ICD-10-CM | POA: Diagnosis not present

## 2023-07-27 DIAGNOSIS — E785 Hyperlipidemia, unspecified: Secondary | ICD-10-CM | POA: Diagnosis not present

## 2023-08-12 ENCOUNTER — Ambulatory Visit (INDEPENDENT_AMBULATORY_CARE_PROVIDER_SITE_OTHER): Payer: Medicare HMO | Admitting: Otolaryngology

## 2023-08-17 ENCOUNTER — Ambulatory Visit (INDEPENDENT_AMBULATORY_CARE_PROVIDER_SITE_OTHER): Payer: Medicare HMO

## 2023-08-17 ENCOUNTER — Ambulatory Visit (INDEPENDENT_AMBULATORY_CARE_PROVIDER_SITE_OTHER): Payer: Medicare HMO | Admitting: Audiology

## 2023-08-17 ENCOUNTER — Encounter (INDEPENDENT_AMBULATORY_CARE_PROVIDER_SITE_OTHER): Payer: Self-pay

## 2023-08-17 VITALS — Ht 62.0 in | Wt 135.0 lb

## 2023-08-17 DIAGNOSIS — H903 Sensorineural hearing loss, bilateral: Secondary | ICD-10-CM

## 2023-08-17 DIAGNOSIS — H6123 Impacted cerumen, bilateral: Secondary | ICD-10-CM | POA: Diagnosis not present

## 2023-08-17 NOTE — Progress Notes (Signed)
Patient was seen today for a hearing aid check. She brought her ReSound ITE rechargeable hearing aids and her Phonak V70 RIC hearing aids. Cleaned both sets of hearing aids, replaced the wax filters, and the right receiver for the Phonak hearing aid. Increased the overall gain in the Phonak RICs and ReSound ITEs. Instructed patient and her family to call if any concerns arise.   Sarah Moyer Sarah Moyer, AUD, CCC-A 08/17/23

## 2023-08-19 DIAGNOSIS — H6123 Impacted cerumen, bilateral: Secondary | ICD-10-CM | POA: Insufficient documentation

## 2023-08-19 DIAGNOSIS — H903 Sensorineural hearing loss, bilateral: Secondary | ICD-10-CM | POA: Insufficient documentation

## 2023-08-19 NOTE — Progress Notes (Signed)
Patient ID: Sarah Moyer, female   DOB: 04-11-1930, 89 y.o.   MRN: [id number]  Follow-up: Progressive hearing loss, recurrent cerumen impaction  HPI: The patient is a 89 y/o female who returns today for follow up evaluation of hearing loss. The patient has a history of bilateral sensorineural hearing loss. She presents today with complaints of progressive worsening of her hearing.  She has a history of recurrent cerumen impaction.  No otalgia, otorrhea, or vertigo is noted. No other ENT, GI, or respiratory issue noted since the last visit.   Exam: General: Communicates without difficulty, well nourished, no acute distress. Head: Normocephalic, no evidence injury, no tenderness, facial buttresses intact without stepoff. Eyes: PERRL, EOMI. No scleral icterus, conjunctivae clear. Neuro: CN II exam reveals vision grossly intact. No nystagmus at any point of gaze. EAC: Bilateral cerumen impaction. Nose: External evaluation reveals normal support and skin without lesions. Dorsum is intact. Anterior rhinoscopy reveals healthy pink mucosa over anterior aspect of inferior turbinates and intact septum. No purulence noted. Oral:  Oral cavity and oropharynx are intact, symmetric, without erythema or edema. Mucosa is moist without lesions. Neck: Full range of motion without pain. There is no significant lymphadenopathy. No masses palpable. Thyroid bed within normal limits to palpation. Parotid glands and submandibular glands equal bilaterally without mass. Trachea is midline. Neuro:  CN 2-12 grossly intact. Gait normal. Vestibular: No nystagmus at any point of gaze. The cerebellar examination is unremarkable.   Procedure: Bilateral cerumen disimpaction.  Anesthesia: None.  Description: Under the operating microscope, the cerumen is carefully removed with a combination of cerumen currette, alligator forceps, and suction catheters. After the cerumen is removed, the TMs are noted to be normal. No mass, erythema, or  lesions. The patient tolerated the procedure well.   AUDIOMETRIC TESTING: I have read and reviewed the audiometric test, which shows bilateral severe high-frequency sensorineural hearing loss. The speech reception threshold is 60dB AD and 60dB AS. The discrimination score is 44% AD and 20% AS. The tympanogram is normal bilaterally.   Assessment: 1.  Bilateral cerumen impaction.  After the disimpaction procedure, both tympanic membranes and middle ear spaces are noted to be normal. 2.  Bilateral stable high-frequency severe sensorineural hearing loss.  This is likely secondary to routine presbycusis.  Plan: 1.  Otomicroscopy with bilateral cerumen disimpaction. 2.  The physical exam findings and the hearing test results are reviewed with the patient. 3.  Continue the use of bilateral hearing aids. 4.  The patient will return for reevaluation in 1 year.

## 2023-08-19 NOTE — Progress Notes (Signed)
[address], [address] [place], [place] [address] [phone]  Audiological Evaluation    Name: Sarah Moyer     DOB:   [birth year]      MRN:   [id number]                                                                                     Service Date: 08/19/2023     Accompanied by: daughter and son   Patient comes today after Dr. Suszanne Conners, ENT sent a referral for a hearing evaluation due to concerns with hearing loss.   Symptoms Yes Details  Hearing loss  [x]  Previous audiogram was completed at Dr. Avel Sensor  Tinnitus  []    Ear pain/ Ear infections  []    Balance problems  []    Noise exposure  []    Previous ear surgeries  []    Family history  []    Amplification  [x]  Has a set of hearing aids that will be checked today by Dr. Serafina Royals.  Other  []       Tympanometry: Right ear: Type A- Normal external ear canal volume with normal middle ear pressure and tympanic membrane compliance Left ear: Type A- Normal external ear canal volume with normal middle ear pressure and tympanic membrane compliance     Pure tone Audiometry: Right ear- Moderately severe to severe sensorineural hearing loss from 250 Hz - 8000 Hz. Left ear-  Moderately severe to severe sensorineural hearing loss from 250 Hz - 8000 Hz.  The hearing test results were completed under inserts and results are deemed to be of good reliability. Test technique:  conventional     Speech Audiometry: Right ear- Speech Reception Threshold (SRT) was obtained at 60 dBHL Left ear-Speech Reception Threshold (SRT) was obtained at 60 dBHL   Word Recognition Score Tested using NU-6 (MLV) Right ear: 44% was obtained at a presentation level of 90 dBHL with contralateral masking which is deemed as  poor .( Of note, patient was tired and this may have affected the results) Left ear: 20% was obtained at a presentation level of 90 dBHL with contralateral masking which is deemed as  poor.    Impression: There is not a significant  difference in pure-tone thresholds between ears.  The significant difference in the word recognition score in between ears continues to be observed.   Recommendations: Follow up with ENT as scheduled for today. Continue with hearing aid in conjunction with communication strategies.   Samantha Ragen MARIE LEROUX-MARTINEZ, AUD

## 2023-09-02 ENCOUNTER — Encounter: Payer: Self-pay | Admitting: Audiology

## 2023-09-07 DIAGNOSIS — Z08 Encounter for follow-up examination after completed treatment for malignant neoplasm: Secondary | ICD-10-CM | POA: Diagnosis not present

## 2023-09-07 DIAGNOSIS — Z85828 Personal history of other malignant neoplasm of skin: Secondary | ICD-10-CM | POA: Diagnosis not present

## 2023-11-06 DIAGNOSIS — G300 Alzheimer's disease with early onset: Secondary | ICD-10-CM | POA: Diagnosis not present

## 2023-11-06 DIAGNOSIS — Z8781 Personal history of (healed) traumatic fracture: Secondary | ICD-10-CM | POA: Diagnosis not present

## 2024-02-01 DIAGNOSIS — G300 Alzheimer's disease with early onset: Secondary | ICD-10-CM | POA: Diagnosis not present

## 2024-02-01 DIAGNOSIS — R42 Dizziness and giddiness: Secondary | ICD-10-CM | POA: Diagnosis not present

## 2024-05-16 DIAGNOSIS — R0602 Shortness of breath: Secondary | ICD-10-CM | POA: Diagnosis not present

## 2024-05-16 DIAGNOSIS — G300 Alzheimer's disease with early onset: Secondary | ICD-10-CM | POA: Diagnosis not present

## 2024-08-01 DIAGNOSIS — G300 Alzheimer's disease with early onset: Secondary | ICD-10-CM | POA: Diagnosis not present

## 2024-08-01 DIAGNOSIS — R42 Dizziness and giddiness: Secondary | ICD-10-CM | POA: Diagnosis not present

## 2024-08-01 DIAGNOSIS — R0602 Shortness of breath: Secondary | ICD-10-CM | POA: Diagnosis not present

## 2024-08-01 DIAGNOSIS — Z79899 Other long term (current) drug therapy: Secondary | ICD-10-CM | POA: Diagnosis not present

## 2024-08-03 ENCOUNTER — Encounter (INDEPENDENT_AMBULATORY_CARE_PROVIDER_SITE_OTHER): Payer: Self-pay | Admitting: Otolaryngology

## 2024-08-03 ENCOUNTER — Ambulatory Visit (INDEPENDENT_AMBULATORY_CARE_PROVIDER_SITE_OTHER): Admitting: Otolaryngology

## 2024-08-03 VITALS — BP 136/80 | HR 84 | Temp 97.0°F | Wt 135.0 lb

## 2024-08-03 DIAGNOSIS — H903 Sensorineural hearing loss, bilateral: Secondary | ICD-10-CM | POA: Diagnosis not present

## 2024-08-03 DIAGNOSIS — H6123 Impacted cerumen, bilateral: Secondary | ICD-10-CM

## 2024-08-03 NOTE — Progress Notes (Signed)
 Patient ID: Sarah Moyer, female   DOB: [birth year], 89 y.o.   MRN: [id number]  Follow-up: Bilateral hearing loss  Discussed the use of AI scribe software for clinical note transcription with the patient, who gave verbal consent to proceed.  History of Present Illness Sarah Moyer is a 89 year old female who presents with hearing difficulties.  She has experienced stable hearing difficulties over the past year and regularly uses a hearing aid.   She has undergone hearing tests in the past, which have shown bilateral high-frequency sensorineural hearing loss. No ear pain, infection, or drainage is reported. She denies any recent changes in her hearing aside from the issues potentially related to wax buildup.  Exam: General: Communicates without difficulty, well nourished, no acute distress. Head: Normocephalic, no evidence injury, no tenderness, facial buttresses intact without stepoff. Face/sinus: No tenderness to palpation and percussion. Facial movement is normal and symmetric. Eyes: PERRL, EOMI. No scleral icterus, conjunctivae clear. Neuro: CN II exam reveals vision grossly intact.  No nystagmus at any point of gaze. Ears: Auricles well formed without lesions.  Bilateral cerumen impaction.  Nose: External evaluation reveals normal support and skin without lesions.  Dorsum is intact.  Anterior rhinoscopy reveals normal mucosa over anterior aspect of inferior turbinates and intact septum.  No purulence noted. Oral:  Oral cavity and oropharynx are intact, symmetric, without erythema or edema.  Mucosa is moist without lesions. Neck: Full range of motion without pain.  There is no significant lymphadenopathy.  No masses palpable.  Thyroid  bed within normal limits to palpation.  Parotid glands and submandibular glands equal bilaterally without mass.  Trachea is midline. Neuro:  CN 2-12 grossly intact.   Procedure: Bilateral cerumen disimpaction Anesthesia: None Description: Under the operating  microscope, the cerumen is carefully removed with a combination of cerumen currette, alligator forceps, and suction catheters.  After the cerumen is removed, the TMs are noted to be normal.  No mass, erythema, or lesions. The patient tolerated the procedure well.    Assessment: 1.  Bilateral cerumen impaction.  After the disimpaction procedure, both tympanic membranes and middle ear spaces are noted to be normal. 2.  Subjectively stable bilateral stable high-frequency severe sensorineural hearing loss.  This is likely secondary to routine presbycusis.   Plan: 1.  Otomicroscopy with bilateral cerumen disimpaction. 2.  The physical exam findings are reviewed with the patient. 3.  Continue the use of bilateral hearing aids. 4.  The patient will return for reevaluation in 1 year.

## 2024-08-08 DIAGNOSIS — N183 Chronic kidney disease, stage 3 unspecified: Secondary | ICD-10-CM | POA: Diagnosis not present

## 2024-08-08 DIAGNOSIS — G300 Alzheimer's disease with early onset: Secondary | ICD-10-CM | POA: Diagnosis not present

## 2024-08-08 DIAGNOSIS — Z23 Encounter for immunization: Secondary | ICD-10-CM | POA: Diagnosis not present

## 2024-09-09 ENCOUNTER — Ambulatory Visit (INDEPENDENT_AMBULATORY_CARE_PROVIDER_SITE_OTHER): Admitting: Audiology

## 2024-09-14 DIAGNOSIS — Z08 Encounter for follow-up examination after completed treatment for malignant neoplasm: Secondary | ICD-10-CM | POA: Diagnosis not present

## 2024-09-14 DIAGNOSIS — L821 Other seborrheic keratosis: Secondary | ICD-10-CM | POA: Diagnosis not present

## 2024-09-14 DIAGNOSIS — Z85828 Personal history of other malignant neoplasm of skin: Secondary | ICD-10-CM | POA: Diagnosis not present

## 2024-10-07 ENCOUNTER — Other Ambulatory Visit (INDEPENDENT_AMBULATORY_CARE_PROVIDER_SITE_OTHER): Payer: Self-pay | Admitting: Otolaryngology

## 2024-10-07 DIAGNOSIS — H903 Sensorineural hearing loss, bilateral: Secondary | ICD-10-CM

## 2024-10-11 ENCOUNTER — Ambulatory Visit (INDEPENDENT_AMBULATORY_CARE_PROVIDER_SITE_OTHER): Payer: Self-pay | Admitting: Audiology

## 2024-10-11 ENCOUNTER — Ambulatory Visit (INDEPENDENT_AMBULATORY_CARE_PROVIDER_SITE_OTHER): Admitting: Audiology

## 2024-10-11 NOTE — Progress Notes (Unsigned)
°  [address], [address] [place], [place] [address] [phone]  Hearing Aid Check     Sarah Moyer comes for a scheduled appointment for a hearing aid check.  Time in:*** Time out:*** Accompanied by:son   Right Left  Hearing aid manufacturer    Hearing aid style    Hearing aid battery    Receiver    Dome/ custom earpiece    Retention wire    Warranty expiration date    Loss and Damage    Additional accessories Expiration date    Initial fitting date    Device was fit at:    Hearing aid services Bundled until:  Bundled until:     Chief complaint: Patient reports ***.  Actions taken: Inspection of the device and listening check showed that ***.  Services fee: $*** was paid at checkout.  Patient was oriented about ***.  Recommend: Return for a hearing aid check ***. Return for a hearing evaluation and to see an ENT, if concerns with hearing changes arise. ***  Kele Withem MARIE LEROUX-MARTINEZ, AUD

## 2024-10-11 NOTE — Progress Notes (Unsigned)
°  [address] [place], [place] [address] [phone]  Audiological Evaluation    Name: Sarah Moyer     DOB:   1930-08-08      MRN:   [id number]                                                                                     Service Date: 10/11/2024     Accompanied by: son   Patient comes today after Dr. Karis, ENT sent a referral for a hearing evaluation due to concerns with hearing loss.   Symptoms Yes Details  Hearing loss  []    Tinnitus  []    Ear pain/ infections/pressure  []    Balance problems  []    Noise exposure history  []    Previous ear surgeries  []    Family history of hearing loss  []    Amplification  []    Other  []  Patient has Alzheimer     Otoscopy: Right ear: {otoscopy:31227} Left ear:  {otoscopy:31227}  Tympanometry: Right ear: {tympanometry results:31367} Left ear: {tympanometry results:31367}  ***  Hearing Evaluation The hearing test results were completed {transducer options:31388} and results are deemed to be of {test reliability:31390::good reliability}. Test technique:  {audiometric test technique:31400::conventional}    Pure tone Audiometry: Right ear- *** {hearing loss types:31372::sensorineural hearing loss} from *** Hz - *** Hz. Left ear-  *** {hearing loss types:31372::sensorineural hearing loss} from *** Hz - *** Hz.  Speech Audiometry: Right ear- {AUD SRT/SAT:19348}. Left ear-{AUD SRT/SAT:19348}.   Word Recognition Score Tested using {word lists:31376::NU-6 (recorded)} Right ear: ***% was obtained at a presentation level of *** dBHL with contralateral masking which is deemed as  {word recognition score:31373}. Left ear: ***% was obtained at a presentation level of *** dBHL with contralateral masking which is deemed as  {word recognition score:31373}.   Impression: {Word recognition Score interpretation:31432::There is not a significant difference in pure-tone thresholds between ears.,There is not a  significant difference in the word recognition score in between ears. }   Recommendations: {Audiology Recommendations:31370::Follow up with ENT as scheduled.}   Bosco Paparella MARIE LEROUX-MARTINEZ, AUD

## 2024-10-25 ENCOUNTER — Encounter: Payer: Self-pay | Admitting: Audiology

## 2024-11-08 ENCOUNTER — Telehealth (INDEPENDENT_AMBULATORY_CARE_PROVIDER_SITE_OTHER): Payer: Self-pay

## 2024-11-08 NOTE — Telephone Encounter (Signed)
 Called patient's mobile # and left a voicemail message requesting a call back to schedule a Hearing Aid Check appointment with Dr. Lacinda to make sure hearing aid settings are ok.  Hearing Aids are back from repair and ready for pickup.  Called home phone # and spoke with caregiver and Niece, Cy Baptist.  She suggested I talk with patient's son Keylie Beavers.

## 2024-11-23 ENCOUNTER — Ambulatory Visit (INDEPENDENT_AMBULATORY_CARE_PROVIDER_SITE_OTHER): Payer: Self-pay

## 2024-11-23 ENCOUNTER — Telehealth (INDEPENDENT_AMBULATORY_CARE_PROVIDER_SITE_OTHER): Payer: Self-pay

## 2024-11-23 DIAGNOSIS — H903 Sensorineural hearing loss, bilateral: Secondary | ICD-10-CM

## 2024-11-23 NOTE — Progress Notes (Signed)
" °  [address] [place], [place] [address] [phone]  Hearing Aid Check    Sarah Moyer daughter comes in today for a hearing aid pick up.   Time in:2:15pm Time out:2:25 pm   Right Left  Manufacturer/Make   Resound Omnia 5 R ITE [id number] Resound Omnia 5 R ITE [id number]  Hearing aid style Custom - in the ear Custom - in the ear  Hearing aid battery rechargeable rechargeable  Receiver      Dome/ custom earpiece      Retention wire      Warranty expiration date 10-22-2025 10-22-2025  Loss and Damage unknown unknown  Additional accessories Expiration date      Initial fitting date 09-24-2022 09-24-2022  Device was fit at: [facility]   Chief Complaint: Hearing aids returned from manufacturer repair (Order No. [id number]).  All internal components and housing were replaced by the manufacturer. Devices were returned programmed and verified in clinic to the patients most recent prescription. The patients family was contacted prior to the appointment and asked to bring the hearing aid charger so the devices could be fully charged for [employer]apping, as a psychiatric nurse for this manufacturer and model was not available.  The family was unable to bring the charger and elected to pick up the hearing aids without verification. At pick-up, the patients daughter expressed frustration regarding the inability to charge the devices in clinic. The situation was explained, and she was informed that a clinic charger has been ordered for future use. The daughter was advised that the patient should return for verification of the hearing aid prescription with [employer]apping.   Services fee: $0 was paid at checkout.   Recommend: Ka should return for verification of hearing aid prescription through speech mapping [facility].  Arlean Rake, AUD "

## 2024-11-23 NOTE — Progress Notes (Deleted)
" °  [address], [address] [place], [place] [address] [phone]  Audiological Evaluation    Name: Sarah Moyer     DOB:   Aug 03, 1930      MRN:   [id number]                                                                                     Service Date: 11/23/2024     Accompanied by: self ***   Patient comes today after {ENT providers:31223} sent a referral for a hearing evaluation due to concerns with {audiology symptoms:31224}.   Symptoms Yes Details  Hearing loss  []    Tinnitus  []    Ear pain/ infections/pressure  []    Balance problems  []    Noise exposure history  []    Previous ear surgeries  []    Family history of hearing loss  []    Amplification  []    Other  []      Otoscopy: Right ear: {otoscopy:31227} Left ear:  {otoscopy:31227}  Tympanometry: Right ear: {tympanometry results:31367} Left ear: {tympanometry results:31367}  ***  Hearing Evaluation The hearing test results were completed {transducer options:31388} and results are deemed to be of {test reliability:31390::good reliability}. Test technique:  {audiometric test technique:31400::conventional}    Pure tone Audiometry: Right ear- *** {hearing loss types:31372::sensorineural hearing loss} from *** Hz - *** Hz. Left ear-  *** {hearing loss types:31372::sensorineural hearing loss} from *** Hz - *** Hz.  Speech Audiometry: Right ear- {AUD SRT/SAT:19348}. Left ear-{AUD SRT/SAT:19348}.   Word Recognition Score Tested using {word lists:31376::NU-6 (recorded)} Right ear: ***% was obtained at a presentation level of *** dBHL with contralateral masking which is deemed as  {word recognition score:31373}. Left ear: ***% was obtained at a presentation level of *** dBHL with contralateral masking which is deemed as  {word recognition score:31373}.   Impression: {Word recognition Score interpretation:31432::There is not a significant difference in pure-tone thresholds between ears.,There is  not a significant difference in the word recognition score in between ears. }   Recommendations: {Audiology Recommendations:31370::Follow up with ENT as scheduled.}   Arlean Rake, AUD  "

## 2024-11-23 NOTE — Telephone Encounter (Signed)
 Patient has an appointment for a Hearing Aid check today to pick up hearing aids after repair.  Per Dr. Lacinda, the charger is needed to ensure the hearing aids have enough charge to complete the appointment.  I called and spoke with the patient's son, Thomasena Vandenheuvel and requested they bring in the charger ahead of the appointment if possible.  He stated he will try to bring the charger to the appointment, but will not be able to drop it off early because the charger is in [place] and the patient is in [place].   I placed a second call to the patient and left a voicemail message offering to reschedule the appointment to a different day so that we would be able to have the charger and make sure the hearing aids were charged adequately to complete the appointment.

## 2024-11-23 NOTE — Progress Notes (Addendum)
 SABRA

## 2024-11-23 NOTE — Telephone Encounter (Signed)
 The patient's son, Celie Desrochers, called me back and stated that his sister will be picking the hearing aids up sometime today and they would like to cancel today's appointment.    I called the patient again per Dr. Epimenio request and spoke with patient's son, Shivani Barrantes.  I explained that normally we schedule a Hearing Aid Check appointment in order to verify the hearing aid prescription before sending the patient out with the hearing aids.  If the patient's daughter would still like to pick up the hearing aids, she may do so.  There is $0 due for the repair since the hearing aids are under warranty.  Mr. Paganelli expressed understanding and stated that at this time, they just want to pick up the hearing aids.  If there is an issue, they will call us  to schedule an appointment.
